# Patient Record
Sex: Female | Born: 1970 | Race: White | Hispanic: No | Marital: Married
Health system: Northeastern US, Academic
[De-identification: ages and names within clinical notes are randomized; demographics above are authoritative.]

## PROBLEM LIST (undated history)

## (undated) DIAGNOSIS — K589 Irritable bowel syndrome without diarrhea: Secondary | ICD-10-CM

## (undated) DIAGNOSIS — Z8679 Personal history of other diseases of the circulatory system: Secondary | ICD-10-CM

## (undated) DIAGNOSIS — E669 Obesity, unspecified: Secondary | ICD-10-CM

## (undated) DIAGNOSIS — D649 Anemia, unspecified: Secondary | ICD-10-CM

## (undated) DIAGNOSIS — R519 Headache, unspecified: Secondary | ICD-10-CM

## (undated) DIAGNOSIS — R51 Headache: Secondary | ICD-10-CM

## (undated) DIAGNOSIS — J339 Nasal polyp, unspecified: Secondary | ICD-10-CM

## (undated) HISTORY — DX: Headache: R51

## (undated) HISTORY — PX: NO SIGNIFICANT SURGICAL HISTORY: 1000005

## (undated) HISTORY — DX: Irritable bowel syndrome, unspecified: K58.9

## (undated) HISTORY — DX: Obesity, unspecified: E66.9

## (undated) HISTORY — DX: Nasal polyp, unspecified: J33.9

## (undated) HISTORY — PX: CERVICAL FUSION: SHX112

## (undated) HISTORY — DX: Anemia, unspecified: D64.9

## (undated) HISTORY — DX: Headache, unspecified: R51.9

---
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
## 2004-04-13 ENCOUNTER — Other Ambulatory Visit: Payer: Self-pay | Admitting: Cardiology

## 2004-04-13 DIAGNOSIS — R109 Unspecified abdominal pain: Secondary | ICD-10-CM

## 2004-04-13 LAB — BLOOD COUNT COMPLETE AUTOMATED
HEMATOCRIT: 39 % (ref 34.9–46.9)
HEMOGLOBIN: 13.2 g/dL (ref 11.7–15.7)
MEAN CORP HGB CONC: 33.8 g/dL (ref 31.4–35.8)
MEAN CORPUSCULAR HGB: 27.9 pg (ref 26.4–34.0)
MEAN CORPUSCULAR VOL: 82.4 fl (ref 80.5–99.7)
MEAN PLATELET VOLUME: 8.7 fL (ref 6.8–12.8)
PLATELET COUNT: 298 10*3/uL (ref 130–400)
RBC DISTRIBUTION WIDTH: 13.1 % (ref 11.0–15.0)
RED BLOOD CELL COUNT: 4.73 M/uL (ref 3.80–5.20)
WHITE BLOOD CELL COUNT: 10.5 10*3/uL (ref 4.5–11.0)

## 2004-04-13 LAB — CHG HEPATIC FUNCTION PANEL
ALANINE AMINOTRANSFERASE: 19 IU/L (ref 7–35)
ALBUMIN: 3.6 g/dl (ref 3.4–4.8)
ALKALINE PHOSPHATASE: 72 IU/L (ref 25–106)
ASPARTATE AMINOTRANSFERASE: 17 IU/L (ref 8–34)
BILIRUBIN DIRECT: 0.1 mg/dl (ref 0.0–0.2)
BILIRUBIN TOTAL: 0.6 mg/dl (ref 0.2–1.1)
TOTAL PROTEIN: 6.3 g/dl (ref 5.9–7.5)

## 2008-01-19 ENCOUNTER — Ambulatory Visit (HOSPITAL_BASED_OUTPATIENT_CLINIC_OR_DEPARTMENT_OTHER): Payer: PRIVATE HEALTH INSURANCE | Admitting: Family Medicine

## 2008-01-19 ENCOUNTER — Encounter (HOSPITAL_BASED_OUTPATIENT_CLINIC_OR_DEPARTMENT_OTHER): Payer: Self-pay | Admitting: Family Medicine

## 2008-01-19 VITALS — BP 124/88 | HR 97 | Temp 99.2°F | Ht 61.0 in | Wt 197.8 lb

## 2008-01-19 DIAGNOSIS — Z01419 Encounter for gynecological examination (general) (routine) without abnormal findings: Secondary | ICD-10-CM

## 2008-01-19 DIAGNOSIS — Z6837 Body mass index (BMI) 37.0-37.9, adult: Secondary | ICD-10-CM | POA: Insufficient documentation

## 2008-01-19 DIAGNOSIS — Z23 Encounter for immunization: Secondary | ICD-10-CM

## 2008-01-19 DIAGNOSIS — E669 Obesity, unspecified: Secondary | ICD-10-CM

## 2008-01-19 DIAGNOSIS — G43909 Migraine, unspecified, not intractable, without status migrainosus: Secondary | ICD-10-CM

## 2008-01-19 DIAGNOSIS — R12 Heartburn: Secondary | ICD-10-CM

## 2008-01-19 DIAGNOSIS — Z833 Family history of diabetes mellitus: Secondary | ICD-10-CM

## 2008-01-19 DIAGNOSIS — K589 Irritable bowel syndrome without diarrhea: Secondary | ICD-10-CM | POA: Insufficient documentation

## 2008-01-19 DIAGNOSIS — IMO0001 Reserved for inherently not codable concepts without codable children: Secondary | ICD-10-CM

## 2008-01-19 DIAGNOSIS — Z832 Family history of diseases of the blood and blood-forming organs and certain disorders involving the immune mechanism: Secondary | ICD-10-CM

## 2008-01-19 LAB — BLOOD COUNT COMPLETE AUTOMATED
HEMATOCRIT: 39.1 % (ref 36.0–48.0)
HEMOGLOBIN: 13.1 g/dl (ref 12.0–16.0)
MEAN CORP HGB CONC: 33.6 g/dl (ref 32.0–36.0)
MEAN CORPUSCULAR HGB: 28.3 pg (ref 27.0–33.0)
MEAN CORPUSCULAR VOL: 84.3 fl (ref 80.0–100.0)
MEAN PLATELET VOLUME: 9.1 fl (ref 6.4–10.8)
PLATELET COUNT: 293 10*3/uL (ref 150–400)
RBC DISTRIBUTION WIDTH: 12.7 % (ref 11.5–14.3)
RED BLOOD CELL COUNT: 4.63 M/uL (ref 4.50–5.10)
WHITE BLOOD CELL COUNT: 11.1 10*3/uL — ABNORMAL HIGH (ref 4.0–10.8)

## 2008-01-19 MED ORDER — BUTALBITAL-APAP-CAFFEINE 50-325-40 MG PO TABS
ORAL_TABLET | ORAL | Status: DC
Start: 2008-01-19 — End: 2008-05-24

## 2008-01-19 MED ORDER — BUTALBITAL-APAP-CAFFEINE 50-325-40 MG PO TABS
ORAL_TABLET | ORAL | Status: DC
Start: 2008-01-19 — End: 2008-01-19

## 2008-01-19 NOTE — Progress Notes (Signed)
PRECEPTOR NOTE:  On the day of the patient's visit, I discussed the key elements of history and physical exam and I reviewed the findings with the resident.  I agree with the assessment and plan as described in their documentation.  Please see resident's note for further details.

## 2008-01-19 NOTE — Progress Notes (Signed)
VIS given prior to administration and reviewed with the patient and or legal guardian. Patient understands the disease and the vaccine. See immunization/Injection module or chart review for date of publication and additional information.

## 2008-01-19 NOTE — Progress Notes (Signed)
SUBJECTIVE:   37 year old female to establish PCP.    Patient Active Problem List:     Migraine [346.90A]     Heartburn [787.1]     Irritable Bowel Syndrome [564.1]     Obesity (BMI 30.0-39.9) [278.00AE]    Migraine - approx 1 x/month, no vision changes, controlled by Fioricet    Heartburn stable on zantac    IBS - flares every few weeks, controlled by metamucil    Obesity - since after 1st child has tried and failed Weight Watchers, Atkins, Nutrisystem.  Eats high-fiber low fat diet.  Not much meat.  Lots of simple carbs.  No soda.  No formal exercise      Past Medical History    Obesity     Anemia          Past Surgical History    NO SIGNIFICANT SURGICAL HISTORY            Current outpatient prescriptions:  ZANTAC 150 MG OR CAPS 1 CAPSULE AT BEDTIME Disp: 30 Rfl: 11   LOESTRIN FE 1.5/30 1.5-30 MG-MCG OR TABS 1 TABLET DAILY Disp: 28 Rfl: 6   BUTALBITAL-APAP-CAFFEINE 50-325-40 MG OR TABS 1 OR 2 TABLETS EVERY 4 HOURS AS NEEDED Disp: 12 Rfl: 0       Allergies: Review of patient's allergies indicates no known allergies.       Tobacco Use: Never           Alcohol Use: No            Social History Narrative    Lives with husband and 4 children.  Does not work outside of home.  Used to Medical laboratory scientific officer.  Likes to spend time with family and friends, go to [employer], local youth organizations.  No formal exercise.        Family History    Heart Father    Heart Brother    Stroke Father    Diabetes Maternal Grandmother    Cancer - Lung Mother    Cancer - Breast Maternal Grandmother    Comment: post menopausal    Cancer - Prostate Maternal Grandmother    Comment: post menopausal    Hypertension Mother    Hypertension Father    Lipids Father           ROS:  Feeling well. No dyspnea or chest pain on exertion.  No abdominal pain, change in bowel habits, black or bloody stools.  No urinary tract symptoms. GYN ROS: normal menses, no abnormal bleeding, pelvic pain or discharge and no breast pain or new or enlarging lumps on self  exam.    OBJECTIVE:   The patient appears well, in NAD.   BP 124/88   Pulse 97   Temp 99.2 F (37.3 C)   Ht 5\' 1"  (1.549 m)   Wt 197 lb 12.8 oz (89.721 kg)   SpO2 98%  ENT normal.  Neck supple. No adenopathy or thyromegaly. PERLA. Clear, good air entry, no wheezes, rhonci or rales. S1 and S2 normal, no murmurs, regular rate and rhythm. No edema. Abdomen soft without tenderness, guarding, mass or organomegaly. Reflexes 2+ at patellar B/L  SKIN: R lateral upper arm with 5mm circumfrential nodule verruca like appearance. Pedunculated brown skin tag between breasts    BREAST EXAM: done 2/09 at GYN office  PELVIC EXAM: done 2/09 at GYN office    ASSESSMENT/PLAN: 37 y/o female with  787.1 Heartburn  Comment: stable, will monitor  Plan: ZANTAC 150  MG OR CAPS    346.90A Migraine  Comment: will refill Fiorict disp #12, counseled to use back-up method of contraception if using fioricet  Plan: BUTALBITAL-APAP-CAFFEINE 50-325-40 MG OR TABS,       V25.9N Contraception  Comment: prescribed by OB/GYN  Plan: LOESTRIN FE 1.5/30 1.5-30 MG-MCG OR TABS    V18.2 Family History of Anemia  Comment: anemia in childhood and pregnancies  Plan: COMPLETE CBC, AUTOMATED    V18.0 Family History of Diabetes Mellitus  Comment: MGM, obese pt  Plan: random glucose today    278.00AE Obesity (BMI 30.0-39.9)  Comment: nutrition and exercise counseling provided, written info given.  Pt declined referral to nutritionist  Plan: ASSAY, BLD/SERUM CHOLESTEROL, ASSAY OF         LIPOPROTEIN (HDL), ASSAY OF BLOOD LIPOPROTEIN         (LDL), THYROID SCREEN TSH    V05.9A Need for Vaccination  Plan: TDAP VACCINE 7 YR + IM    Skin Tags  1 skin tag between breasts referred to procedure clinic for removal  Skin nodule  L lateral upper arm -c/w wart, referred to procedure clinic for removal     F/U 1 year, sooner PRN  Silvestre Mesi, M.D.

## 2008-01-20 LAB — GLUCOSE RANDOM: Glucose Random: 80 mg/dl (ref 74–160)

## 2008-01-20 LAB — CHG LIPOPROTEIN DIRECT MEASUREMENT LDL CHOLESTEROL: LOW DENSITY LIPOPROTEIN DIRECT: 150 mg/dl — ABNORMAL HIGH (ref 0–100)

## 2008-01-20 LAB — CHOLESTEROL SERUM/WHOLE BLOOD TOTAL: Cholesterol: 239 mg/dl — ABNORMAL HIGH (ref 0–200)

## 2008-01-20 LAB — CHG LIPOPROTEIN DIR MEAS HIGH DENSITY CHOLESTEROL: HIGH DENSITY LIPOPROTEIN: 67 mg/dl (ref 35–85)

## 2008-01-20 LAB — THYROID SCREEN TSH REFLEX FT4: THYROID SCREEN TSH REFLEX FT4: 1.03 u[IU]/mL (ref 0.34–5.60)

## 2008-01-20 NOTE — Progress Notes (Addendum)
Addended by: Mervin Hack C on: 01/20/2008 9:05:03 AM     Modules accepted: Orders

## 2008-01-20 NOTE — Progress Notes (Addendum)
Addended by: Earlyne Iba on: 01/20/2008 5:18:20 PM     Modules accepted: Orders

## 2008-02-19 ENCOUNTER — Ambulatory Visit (HOSPITAL_BASED_OUTPATIENT_CLINIC_OR_DEPARTMENT_OTHER): Payer: PRIVATE HEALTH INSURANCE | Admitting: Family Medicine

## 2008-02-19 VITALS — BP 122/80 | HR 78 | Temp 98.1°F | Resp 16 | Wt 199.0 lb

## 2008-02-19 DIAGNOSIS — L919 Hypertrophic disorder of the skin, unspecified: Secondary | ICD-10-CM

## 2008-02-19 DIAGNOSIS — L909 Atrophic disorder of skin, unspecified: Secondary | ICD-10-CM

## 2008-02-19 DIAGNOSIS — I781 Nevus, non-neoplastic: Secondary | ICD-10-CM

## 2008-02-19 DIAGNOSIS — D235 Other benign neoplasm of skin of trunk: Secondary | ICD-10-CM

## 2008-02-19 NOTE — Patient Instructions (Addendum)
DERMATOFIBROMA    Call if bleeding, infection, pus, fevers, increased pain.

## 2008-02-19 NOTE — Progress Notes (Signed)
PATIENT/PROCEDURE VERIFICATION DOCUMENTATION    Correct patient: Yes  Correct procedure: Yes  Correct side, site, mark visible if applicable: Yes  Correct position: Yes  Special equipment/implant(s) present, if applicable: Yes    Time-out completed, documented by provider doing procedure or designated team member:  Avani Rana    02/19/2008    3:30 PM    SUBJECTIVE:  37 year old female presents for lesion removal. 1. Lesion on right arm for several years started flat, now a little raised, occasionally painful  2. Left underarm skin tag  3. Fleshy lesion between breasts  4. Red lesion under right breast  5. New lesion on left side of nasal bridge    OBJECTIVE: BP 122/80   Pulse 78   Temp (Src) 98.1 F (36.7 C) (Oral)   Resp 16   Wt 199 lb (90.266 kg)   SpO2 100%  Pain Score: 0 (0/10)  Gen: overweight female, a little anxious, NAD  Skin: 1. right lateral arm 1.78mm well circumscribed slightly red in color, papule. NT  2. Acrochordon in left axilla  3. Fleshy, pigmented nodule 3mm lower sternum between breasts  4. Red, vascular 2.64mm oblong nodule under right breast  5. 1mm soft papule on left bridge of nose. Uniformly pigmented, well circumscribed.    ASSESSMENT:  1. Dermatofibroma - no need for bx at this time. Pt given information to read.  2. Acrochordon - painful location  3.  Pigmented intradermal nevus  4. Cherry hemangioma  5. Simple nevus Left Nasal Bridge - no need for intervention at this time, continue to monitor.    PLAN:  After informed consent was obtained, using Betadine for cleansing   and 1% Lidocaine with epinephrine and bicarbonate for anesthetic, with sterile   technique a shave excision of the lesion was performed  flush with   the surrounding skin.  Hemostasis was obtained by drysol and pressure.    Antibiotic dressing is applied, and wound care instructions   provided.  Be alert for any signs of cutaneous infection.  Two   specimens are labelled and sent to pathology for evaluation.  The    procedure was well tolerated without complications.

## 2008-02-21 NOTE — Progress Notes (Addendum)
I precepted and supervised the shave excisions of three separate benign lesions in this 37 yo woman.  These included a skin tag in the L axilla region, a pigmented raised intradermal nevus on lower sternum, and a large cherry angioma in medial flexure crease below the R breast.  I have reviewed the procedure note by Dr. Marney Doctor and agree with her descriptions and postop care plans as described.

## 2008-02-24 ENCOUNTER — Telehealth (HOSPITAL_BASED_OUTPATIENT_CLINIC_OR_DEPARTMENT_OTHER): Payer: Self-pay | Admitting: Family Medicine

## 2008-02-24 LAB — SURGICAL PATH SPECIMEN

## 2008-02-24 NOTE — Telephone Encounter (Signed)
Called and spoke to husband who was inquiring about lesions removed last week.  Reasured that they were benign.  One was compound nevus and other capillary angioma.  All questions answered.

## 2008-03-10 ENCOUNTER — Encounter (HOSPITAL_BASED_OUTPATIENT_CLINIC_OR_DEPARTMENT_OTHER): Payer: Self-pay | Admitting: Family Medicine

## 2008-05-24 ENCOUNTER — Other Ambulatory Visit (HOSPITAL_BASED_OUTPATIENT_CLINIC_OR_DEPARTMENT_OTHER): Payer: Self-pay | Admitting: Ambulatory Care

## 2008-05-24 DIAGNOSIS — G43909 Migraine, unspecified, not intractable, without status migrainosus: Secondary | ICD-10-CM

## 2008-05-24 MED ORDER — BUTALBITAL-APAP-CAFFEINE 50-325-40 MG PO TABS
ORAL_TABLET | ORAL | Status: DC
Start: 2008-05-24 — End: 2009-05-31

## 2008-05-24 NOTE — Telephone Encounter (Signed)
Staff Message copied by Vinetta Bergamo on Tue May 24, 2008 11:48 AM  ------   Message from: Fermin Schwab   Created: Tue May 24, 2008 11:41 AM   Regarding: rx refill     Priscilla Williams [id number], 37 year old, female, Telephone Information:  Home Phone [phone]  Work Phone [phone]      Cleotis Lema NUMBER: [phone]  Best time to call back: -  Cell phone:   Other phone:    Available times:    Patient's language of care: English    Patient does not need an interpreter.    Patient's PCP: Helaine Chess    Person calling on behalf of patient: Patient (self)    Calls today for med refill(s). Patient says pharmacy already called requesting refill on this. Patient would also like to know if there's possible to get more pills at one time because [place] and she always take 2 pills at a time. Please advise.    Patient's Preferred Pharmacy:   [facility] [address] [place]  Phone: [phone] Fax: [phone]

## 2008-05-24 NOTE — Telephone Encounter (Signed)
Called patient back. She said she needs a refill on her firocet. She takes it for migraines. She wants to know if she can get a supply of 30. She said when she gets the migraine she usually takes 2 every 4 hours. She said if she has a 30 day supply it will really last her a long time and she won't have to call. She said the 12 pills she got in July lasted her until just about 3 weeks ago. She is going away on Friday and needs them before she leaves.  Advised we would call her if there was a problem.

## 2009-05-31 ENCOUNTER — Other Ambulatory Visit (HOSPITAL_BASED_OUTPATIENT_CLINIC_OR_DEPARTMENT_OTHER): Payer: Self-pay | Admitting: Family Medicine

## 2009-05-31 DIAGNOSIS — G43909 Migraine, unspecified, not intractable, without status migrainosus: Secondary | ICD-10-CM

## 2009-05-31 MED ORDER — BUTALBITAL-APAP-CAFFEINE 50-325-40 MG PO TABS
ORAL_TABLET | ORAL | Status: DC
Start: 2009-05-31 — End: 2010-02-20

## 2009-05-31 NOTE — Telephone Encounter (Signed)
[facility]    Person calling on behalf of patient: Patient (self)    May list multiple medications in this section  Medicine Name: Butalbital-acetaminophern-caffeine  Dosage: 50-325-40 mg  Frequency (how many pills, how many times a day): 1 or 2 tablets every 4 hours as needed  Number of pills left: none  Documented patient preferred pharmacies:  [facility] [address] [place] [phone] Fax: [phone]    Has an appt to see pcp 06/30/09 for cpex

## 2009-06-30 ENCOUNTER — Encounter (HOSPITAL_BASED_OUTPATIENT_CLINIC_OR_DEPARTMENT_OTHER): Payer: Self-pay | Admitting: Family Medicine

## 2009-06-30 ENCOUNTER — Ambulatory Visit (HOSPITAL_BASED_OUTPATIENT_CLINIC_OR_DEPARTMENT_OTHER): Payer: PRIVATE HEALTH INSURANCE | Admitting: Family Medicine

## 2009-06-30 VITALS — BP 114/80 | HR 84 | Temp 98.8°F | Ht 61.0 in

## 2009-06-30 DIAGNOSIS — K589 Irritable bowel syndrome without diarrhea: Secondary | ICD-10-CM

## 2009-06-30 DIAGNOSIS — G43909 Migraine, unspecified, not intractable, without status migrainosus: Secondary | ICD-10-CM

## 2009-06-30 DIAGNOSIS — Z0000 Encounter for general adult medical examination without abnormal findings: Secondary | ICD-10-CM

## 2009-06-30 DIAGNOSIS — R12 Heartburn: Secondary | ICD-10-CM

## 2009-06-30 DIAGNOSIS — F439 Reaction to severe stress, unspecified: Secondary | ICD-10-CM

## 2009-06-30 DIAGNOSIS — E669 Obesity, unspecified: Secondary | ICD-10-CM

## 2009-06-30 NOTE — Progress Notes (Signed)
PRECEPTOR NOTE:  On the day of the patient's visit, I discussed the key elements of history and physical exam and I reviewed the findings with the resident.  I agree with the assessment and plan as described in their documentation.  Please see resident's note for further details.

## 2009-06-30 NOTE — Progress Notes (Signed)
Priscilla Williams is a 39 year old female here for a physical.    Patient Active Problem List:     Migraine [346.90A]     Heartburn [787.1]     Irritable Bowel Syndrome [564.1]     Obesity (BMI 30.0-39.9) [278.00AE]    I have reviewed the past medical, surgical, social and family history and updated these sections of EpicCare as relevant. All interim labs, test results, and consult notes were reviewed and discussed with Priscilla Williams. Medications were reconciled during this visit and a current medication list was given to the patient at the end of the visit.    Lot of stress lately, husband with recent health issue, neglecting self to get everything done.    PHQ-9 score of 4, causing somewhat difficulty with life.  Hard staying asleep, getting up most nights to urinate  Feels she is easily distractible - but does not cause problems at work  Discussed coping mechanisms including taking small amounts of personal time daily to decompress, talking to support people vs having therapy visits to discuss.     Also has toothache - saw dentist - crown to cost $1500      Migraines - Takes fioricet couple times per month, has tried fiorinal in past without relief, prefers not to take daily meds.  Has identified stress as a trigger.  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  Pt agrees to stop today, will be abstinent or use condoms until discuss progesterone-only options with her ObGyn.     IBS - Uses metamucil daily, makes more regular.  Still occ gets diarrhea. Has identified stressa nd particular dietary triggers.     Heartburn - Takes Zantac occ, sometimes rolaid instead, does not like daily medication    ROS: Occ SOB with stairs, no CP or leg edema    HM: Pt has had pap last year, not due until 2012, will have pelvic exam and breast exam at [facility] visit.  Pt declines a flu shot.      PE: BP 114/80   Pulse 84    Temp(Src) 98.8 F (37.1 C) (Temporal)   Ht 5\' 1"  (1.549 m)   SpO2 98%  Gen - Pleasant woman sitting upright in chair in NAD  HEENT - NC/AT, PERRLA, EOMI, TM clear bilat, Nares patent bilat, MMM, poor dentition - esp molars, Post pharnyx without erythema or exudate  Neck - FROM, no LAD, no thyromegaly  CV - RRR, no m/r/g  Resp - CTAB  Abd - Overweight, +BS, soft, NT/ND  Ext - No edema  Skin - No suspicious lesions noted  Psych - Appropriate, good sense of humor  Neuro - CNII-XII intact, strength 5/5 UE and LE bilat, sensation to lt touch grossly intact, normal gait  Genital - Defer  Breast - Defer    A/P:    V70.0 Routine general medical examination at a health care facility  (primary encounter diagnosis)  Comment: Relatively healthy woman. HM UTD, Pelvic and breast exam to be completed at [facility] visit. Refuses flu shot despite counselling    346.90A Migraine  Comment: Somewhat frequent (around 2/month), controlled with fioricet.  Pt prefers not to take daily controller medication, bt may benefit from change to triptan for migraine abortive medication.   Consider at future visits. Acute stress likely playing a role.  Given previous aura, pt strongly advised to stop estrogen-containing BC, pt agrees, will f/u with [facility]  for alternative.  Progesterone only options briefly discussed with pt.  - Pt will log migraine sx and triggers to bring to next visit  - F/u in 2 months    787.1 Heartburn  Comment: Well controlled with occ Zantac vs rolaids.  Pt refuses to take daily medication  Plan: ranitidine (ZANTAC) 150 MG TABS    564.1 Irritable bowel syndrome  Comment: Under relative control with metamucil and avoidance of triggers.  Acute stress likely playing a role.    278.00J Obesity  Comment: Pt would like to lose weight, but has found it hard to find time to exercise outside of work.  Pt encouraged to take some time for herself to be there for her family in the long run.  Acute stress likely playing a role.    V62.89E  Situational stress  Comment: Increased aily life stress due to difficulty at work, raising 4 children and taking care fo acutely ill husband. Pt neglects self to take care of others.  With motivational interviewing, was able to identify brief periods in day she might take time for herself to de-stress.  Will try this and identify new goal at next visit if successful. Pt understands stress relates to many of her above problems.  No acute depression, denies SI,HI.  Encouraged to call and be seen earlier if needed.    F/u in 2 months for migraines and stress

## 2009-08-18 ENCOUNTER — Telehealth (HOSPITAL_BASED_OUTPATIENT_CLINIC_OR_DEPARTMENT_OTHER): Payer: Self-pay | Admitting: Family Medicine

## 2009-08-18 ENCOUNTER — Ambulatory Visit (HOSPITAL_BASED_OUTPATIENT_CLINIC_OR_DEPARTMENT_OTHER): Payer: PRIVATE HEALTH INSURANCE | Admitting: Family Medicine

## 2009-08-18 DIAGNOSIS — IMO0001 Reserved for inherently not codable concepts without codable children: Secondary | ICD-10-CM

## 2009-08-18 MED ORDER — NORETHINDRONE 0.35 MG PO TABS
1.0000 | ORAL_TABLET | ORAL | Status: DC
Start: 2009-08-18 — End: 2009-11-13

## 2009-08-18 NOTE — Telephone Encounter (Signed)
Message copied by Earlyne Iba on Fri Aug 18, 2009  2:37 PM  ------       Message from: RIVERA, [place]       Created: Fri Aug 18, 2009  2:31 PM       Regarding: rx         Bhakti Labella [id number], 39 year old, female, Telephone Information:       Home Phone      [phone]       Work Phone      [phone]       Mobile          Not on file.                     CALL BACK NUMBER: [phone]       Cell phone:        Other phone:              Available times:              Patient's language of care: English              Patient does not need an interpreter.              Patient's Care Team:               Person calling on behalf of patient: Patient (self)              Calls today pt was told she should take a birthcontrol with out estrogen at her last appt. She thought she would go thru her ob/gyn but her appt is not anytime soon. Pt would like a birthcontrol called in with out estrogen.               Patient's Preferred Pharmacy: [facility] [address] [place]              Phone: [phone] Fax: [phone]

## 2009-08-18 NOTE — Progress Notes (Signed)
This encounter was opened in error.  Please disregard.

## 2009-08-18 NOTE — Telephone Encounter (Signed)
Patient would like to dicuss a birth control with her PCP

## 2009-08-18 NOTE — Telephone Encounter (Signed)
Called patient, who was not aware she had missed an appt this AM.  Patient will call to reschedule migraine follow-up.    Patient advised to stop combined contraceptive pills, which she has.  She preferred to discuss progesterone only method with her Ob/Gyn provider, but could not get a visit for months.  She will continue abstinence until she is able to start the new progesterone only pills.      Prescription for micronor sent to pharmacy

## 2009-11-13 ENCOUNTER — Other Ambulatory Visit (HOSPITAL_BASED_OUTPATIENT_CLINIC_OR_DEPARTMENT_OTHER): Payer: Self-pay | Admitting: Family Medicine

## 2009-11-13 DIAGNOSIS — IMO0001 Reserved for inherently not codable concepts without codable children: Secondary | ICD-10-CM

## 2009-11-13 MED ORDER — NORETHINDRONE 0.35 MG PO TABS
1.0000 | ORAL_TABLET | ORAL | Status: AC
Start: 2009-11-13 — End: 2010-02-10

## 2009-11-13 NOTE — Telephone Encounter (Signed)
[facility]    Person calling on behalf of patient: Patient (self)    May list multiple medications in this section  Medicine Name: Norethindrome  Dosage: 0.35 mg  Frequency (how many pills, how many times a day): one tab daily  Number of pills left: none  Documented patient preferred pharmacies:  [facility] [address] [place]: [phone] Fax: [phone]      CALL BACK NUMBER:   Cell phone:   Other phone:    Available times:    Patient's language of care: English    Patient does not need an interpreter.

## 2009-12-28 ENCOUNTER — Other Ambulatory Visit (HOSPITAL_BASED_OUTPATIENT_CLINIC_OR_DEPARTMENT_OTHER): Payer: Self-pay | Admitting: Family Medicine

## 2009-12-29 NOTE — Telephone Encounter (Signed)
Priscilla Williams is a 39 year old female has requested a refill of Jolivette 0.35mg   Other Med Adult:  Most Recent BP Reading(s)  06/30/09 : 114/80        Cholesterol (mg/dl)   Date     Date  Value    01/19/2008  239*   ----------    LDL (mg/dl)   Date     Date  Value    01/19/2008  150*   ----------    HDL (mg/dl)   Date     Date  Value    01/19/2008  67    ----------    No results found for this basename: tg:1        THYROID SCREEN TSH (uIU/mL)   Date     Date  Value    01/19/2008  1.03    ----------        No results found for this basename: TSH:1      No results found for this basename: hgba1c:1        No results found for this basename: INR:3       Documented patient preferred pharmacies:  [facility] [address] [place]: [phone] Fax: [phone]

## 2010-02-09 ENCOUNTER — Ambulatory Visit (HOSPITAL_BASED_OUTPATIENT_CLINIC_OR_DEPARTMENT_OTHER): Payer: PRIVATE HEALTH INSURANCE | Admitting: Family Medicine

## 2010-02-09 ENCOUNTER — Telehealth (HOSPITAL_BASED_OUTPATIENT_CLINIC_OR_DEPARTMENT_OTHER): Payer: Self-pay | Admitting: Ambulatory Care

## 2010-02-09 ENCOUNTER — Encounter (HOSPITAL_BASED_OUTPATIENT_CLINIC_OR_DEPARTMENT_OTHER): Payer: Self-pay | Admitting: Family Medicine

## 2010-02-09 VITALS — BP 120/88 | HR 70 | Temp 98.2°F | Resp 18 | Wt 193.0 lb

## 2010-02-09 DIAGNOSIS — M7060 Trochanteric bursitis, unspecified hip: Secondary | ICD-10-CM

## 2010-02-09 MED ORDER — NAPROXEN 500 MG PO TABS
500.0000 mg | ORAL_TABLET | Freq: Two times a day (BID) | ORAL | Status: DC
Start: 2010-02-09 — End: 2014-04-11

## 2010-02-09 NOTE — Progress Notes (Signed)
Cc: right hip pain    HPI: 39 yo F with h/o IBS presents with 2 weeks of right hip pain.    1. hip pain.  2 weeks ago  Walked a lot to work  Right hip started to hurt  slight pain at the beginning, thought twisted it  Yesterday around 3pm, getting out of car. Halfway across the street felt she could not move her right leg  10/10 yesterday  Constant all night  Now able to walk and function again    This am. Got out of bed and felt like she couldn't move her right leg. Couldn't lift up her hip. But then got better.    Constant ache now  If moves it gets a sharp pain.   Now standing or walking it is worse  In right hip, when bad radiates to knee  Better: advil and rest  Worse: standing or moving  No numbness or tingling  No back problems.     Did have some popping of right hip in the past.    PMH, med and allergies reviewed and updated in epic.    PE:  BP 120/88   Pulse 70   Temp(Src) 98.2 F (36.8 C) (Temporal)   Resp 18   Wt 193 lb (87.544 kg)   SpO2 100%  Gen: NAD   CV:  RRR, no m/r/g  Pulm: CTAB no w/r/r  LE: right hip: no gross deformity of hip.  nontender over asis, nontender over paraspinal muscles or gluteus muscles. +tender of trochanteric bursa. Able to extend hip fully will some discomfort. Pain with external rotation. Internal rotation is normal. When extending leg back down to examining table there was an audible popping sound. Left side exam is normal. Knee exams are wnl as well.    A/p: 39 yo F with trochanteric bursitis.    726.5S Trochanteric bursitis  (primary encounter diagnosis)  Comment: pt has pain over trochanter and has morning stiffness as well. She has some popping which can occur as iliotibial band moves over the trochanter.  1. NSAIDs naproxen (NAPROSYN) 500 MG tablet  2. Ice  3. Rest. 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 Not was given for modified work. Only to work 1/2 days.  4. F/u in 2 weeks. If still having pain will consider trochanter bursa  injection.    Otis Dials, MD  Discussed with Dr. Hyacinth Meeker.

## 2010-02-09 NOTE — Telephone Encounter (Signed)
Message copied by Vinetta Bergamo on Fri Feb 09, 2010  9:37 AM  ------       Message from: Marykay Lex       Created: Fri Feb 09, 2010  9:19 AM       Regarding: needs call back       Contact: [phone]         [facility]              Kaarin Pardy [id number], 39 year old, female, Telephone Information:       Home Phone      [phone]       Work Phone      [phone]       Mobile          Not on file.                     Cleotis Lema NUMBER: [phone]       Best time to call back: asap       Cell phone:        Other phone:              Available times:              Patient's language of care: English              Patient does not need an interpreter.              Patient's PCP: Earl Lites. Darrol Angel, MD              Person calling on behalf of patient: Patient (self)              Calls today with a sick call. Pt hurt her leg and is having trouble walking I booked for tomorrow but she wants to talk to a nurse              Patient's Preferred Pharmacy:        [facility] [address] [place]              Phone: [phone] Fax: [phone]

## 2010-02-09 NOTE — Telephone Encounter (Signed)
Called and spoke to patient. She states last week her hip started to hurt her. She does a lot of walking and thought she just over did it. But yesterday she could barely walk, stand or rotate her leg. It then eased p and she was able to walk, but with a pain and ache. She laid on her back and she can't even lift her leg.  appt made today for evaluation.

## 2010-02-10 ENCOUNTER — Ambulatory Visit (HOSPITAL_BASED_OUTPATIENT_CLINIC_OR_DEPARTMENT_OTHER): Payer: PRIVATE HEALTH INSURANCE | Admitting: Family Medicine

## 2010-02-13 NOTE — Progress Notes (Addendum)
Discussed case with the resident, reviewed the patient's problem list and medication list. Agree with assessment and plan as presented by the resident. Katherine Miller MD

## 2010-02-20 ENCOUNTER — Other Ambulatory Visit (HOSPITAL_BASED_OUTPATIENT_CLINIC_OR_DEPARTMENT_OTHER): Payer: Self-pay | Admitting: Family Medicine

## 2010-02-20 NOTE — Telephone Encounter (Signed)
Priscilla Williams is a 39 year old female has requested a refill of butalbital-acetaminophen-caffeine (fioricet) prescribed on 05/31/2009  Other Med Adult:  Most Recent BP Reading(s)  02/09/10 : 120/88        Cholesterol (mg/dl)   Date     Date  Value    01/19/2008  239*   ----------    LDL (mg/dl)   Date     Date  Value    01/19/2008  150*   ----------    HDL (mg/dl)   Date     Date  Value    01/19/2008  67    ----------    No results found for this basename: tg:1        THYROID SCREEN TSH (uIU/mL)   Date     Date  Value    01/19/2008  1.03    ----------        No results found for this basename: TSH:1      No results found for this basename: hgba1c:1        No results found for this basename: INR:3       Documented patient preferred pharmacies:  [facility] [address] [place]: [phone] Fax: [phone]

## 2010-02-28 ENCOUNTER — Other Ambulatory Visit (HOSPITAL_BASED_OUTPATIENT_CLINIC_OR_DEPARTMENT_OTHER): Payer: Self-pay | Admitting: Family Medicine

## 2010-02-28 DIAGNOSIS — R12 Heartburn: Secondary | ICD-10-CM

## 2010-02-28 MED ORDER — RANITIDINE HCL 150 MG PO TABS
150.0000 mg | ORAL_TABLET | Freq: Every day | ORAL | Status: DC
Start: 2010-02-28 — End: 2012-04-21

## 2010-02-28 NOTE — Telephone Encounter (Signed)
Refilled Rx. 

## 2010-03-05 ENCOUNTER — Encounter (HOSPITAL_BASED_OUTPATIENT_CLINIC_OR_DEPARTMENT_OTHER): Payer: Self-pay | Admitting: Family Medicine

## 2010-03-05 ENCOUNTER — Ambulatory Visit (HOSPITAL_BASED_OUTPATIENT_CLINIC_OR_DEPARTMENT_OTHER): Payer: PRIVATE HEALTH INSURANCE | Admitting: Family Medicine

## 2010-03-05 VITALS — BP 130/80 | HR 99 | Temp 97.2°F | Resp 18 | Wt 197.0 lb

## 2010-03-05 DIAGNOSIS — M7060 Trochanteric bursitis, unspecified hip: Secondary | ICD-10-CM

## 2010-03-05 NOTE — Progress Notes (Signed)
F/u hip pain    HPI: 39 yo F with h/o obesity presents for 1 month f/u for trochanteric bursitis.    1. Right hip pain  Trying to stay off of it  Better  Still aches here and there  Depends on what she does  Before it was a 10.  Now notices it when walking-aching.  Worse climbing stairs  3-4/10  Walking 1/2 days at work.   Not taking the medications everyday, doesn't need to.  Thinks she is much better  She does not think she needs and injection    Declined flu shot today.     PMH, med and allergies reviewed and updated in epic.    SH: husband recovering from a cabg and has 4 kids at home. Very busy, lots of stress but managing ok.     PE:  BP 130/80   Pulse 99   Temp(Src) 97.2 F (36.2 C) (Temporal)   Resp 18   Wt 197 lb (89.359 kg)   SpO2 98%  Gen: NAD   CV:  RRR, no m/r/g  Pulm: CTAB no w/r/r  LE: full flexion at hip b/l. non tender over trochanteric bursa and iliotibial band on right. Full flexion/extension at the knees.    A/p: 39 yo F here for hip pain f/u, pain is much better.    726.5S Trochanteric bursitis  (primary encounter diagnosis)  Comment: on right. Much improved with rest and nsaids.  1. Discussed with pt to continue to take it easy  2. May start stretches now and yoga as needed.  3. If flares again try rest, ice, nsaids, if not improved enough return to office for injection.    Otis Dials, MD  Discussed with Dr. Darlyne Russian.

## 2010-03-05 NOTE — Progress Notes (Signed)
Much improved with rest and NSAIDS.

## 2010-03-21 ENCOUNTER — Telehealth (HOSPITAL_BASED_OUTPATIENT_CLINIC_OR_DEPARTMENT_OTHER): Payer: Self-pay | Admitting: Family Medicine

## 2010-03-21 NOTE — Telephone Encounter (Signed)
Message copied by Raynelle Dick on Wed Mar 21, 2010  3:48 PM  ------       Message from: Sherrlyn Hock       Created: Wed Mar 21, 2010  1:54 PM       Regarding: nasal polps         [facility]              Priscilla Williams [id number], 39 year old, female, Telephone Information:       Home Phone      [phone]       Work Phone      [phone]       Mobile          Not on file.                     CALL BACK [phone]       Best time to call back:  Anytime after 3 pm       Cell phone:        Other phone:              Available times:              Patient's language of care: English              Patient does not need an interpreter.              Patient's PCP: Earl Lites. Darrol Angel, MD              Person calling on behalf of patient: Patient (self)              Calls today  Has nasal polps does she need to come in or can dr just give her a script               Patient's Preferred Pharmacy:        [facility] [address] [place]              Phone: [phone] Fax: [phone]

## 2010-03-21 NOTE — Telephone Encounter (Signed)
lmtcb

## 2010-03-22 ENCOUNTER — Encounter (HOSPITAL_BASED_OUTPATIENT_CLINIC_OR_DEPARTMENT_OTHER): Payer: Self-pay | Admitting: Family Medicine

## 2010-03-22 DIAGNOSIS — J339 Nasal polyp, unspecified: Secondary | ICD-10-CM | POA: Insufficient documentation

## 2010-03-22 HISTORY — DX: Nasal polyp, unspecified: J33.9

## 2010-03-22 MED ORDER — MOMETASONE FUROATE 50 MCG/ACT NA SUSP
2.00 | Freq: Two times a day (BID) | NASAL | Status: AC
Start: 2010-03-22 — End: 2010-09-20

## 2010-03-22 NOTE — Telephone Encounter (Signed)
Returned pt call.  She has a hx of nasal polyps and saw Dr Addison Naegeli in 2000-2002 and had scope done.  At time pt was not a part of [place].  She was given Nasonex which helped.  She can now feel polyps again in nose and is wondering if she can get a Rx for Nasonex or something similar to help relieve this problem.

## 2010-03-22 NOTE — Telephone Encounter (Signed)
Pls c/b, re: nasal polyps. Has had in past and medicine has helped in the past  [phone]

## 2010-03-22 NOTE — Telephone Encounter (Signed)
Returning pt call for Dr. Darrol Angel. Left message on voicemail that I will send in nasonex for pt since she had experienced this before per discussion with RN and had scope with ENT. Advised to make appt if symptoms worsen or do not improve

## 2010-03-24 ENCOUNTER — Encounter (HOSPITAL_BASED_OUTPATIENT_CLINIC_OR_DEPARTMENT_OTHER): Payer: Self-pay | Admitting: Family Medicine

## 2010-03-24 DIAGNOSIS — M7060 Trochanteric bursitis, unspecified hip: Secondary | ICD-10-CM | POA: Insufficient documentation

## 2010-03-26 ENCOUNTER — Encounter (HOSPITAL_BASED_OUTPATIENT_CLINIC_OR_DEPARTMENT_OTHER): Payer: Self-pay | Admitting: Family Medicine

## 2010-03-26 ENCOUNTER — Telehealth (HOSPITAL_BASED_OUTPATIENT_CLINIC_OR_DEPARTMENT_OTHER): Payer: Self-pay | Admitting: Registered Nurse

## 2010-03-26 NOTE — Telephone Encounter (Signed)
Message copied by Marlowe Kays on Mon Mar 26, 2010 12:20 PM  ------       Message from: Marykay Lex       Created: Mon Mar 26, 2010 11:35 AM       Regarding: needs call back       Contact: [phone]         [facility]              [place] [id number], 39 year old, female, Telephone Information:       Home Phone      [phone]       Work Phone      [phone]       Mobile          Not on file.                     CALL BACK NUMBER: [phone]       Best time to call back: asap       Cell phone:        Other phone:              Available times:              Patient's language of care: English                            Patient's PCP: Earl Lites. Darrol Angel, MD              Person calling on behalf of patient: [facility] in [place], [address] ( [address] )              Calls today with questions and concerns. Has questions about pt medication, nasanex              Patient's Preferred Pharmacy:        [facility] [address] [place]              Phone: [phone] Fax: [phone]

## 2010-03-26 NOTE — Telephone Encounter (Signed)
nasonex requires p/a

## 2010-04-02 MED ORDER — FLUTICASONE PROPIONATE 50 MCG/ACT NA SUSP
1.00 | Freq: Every day | NASAL | Status: AC
Start: 2010-04-02 — End: 2010-06-02

## 2010-04-02 NOTE — Telephone Encounter (Signed)
I will send in rx for flonase.  I will have nurse inform pt.

## 2010-04-02 NOTE — Telephone Encounter (Signed)
Called and spoke to patient and gave her message below.

## 2010-04-02 NOTE — Telephone Encounter (Signed)
Message copied by Vinetta Bergamo on Mon Apr 02, 2010 10:19 AM  ------       Message from: Barbie Haggis       Created: Mon Apr 02, 2010 10:15 AM       Regarding: Rx         [facility]              Priscilla Williams [id number], 39 year old, female, Telephone Information:       Home Phone      [phone]       Work Phone      [phone]       Mobile          Not on file.                     CALL BACK NUMBER: [phone]       Best time to call back:        Cell phone:        Other phone:              Available times:              Patient's language of care: English              Patient does not need an interpreter.              Patient's PCP: Earl Lites. Darrol Angel, MD              Person calling on behalf of patient: Patient (self)              Calls today with questions and concerns.       mometasone (NASONEX) 50 MCG/ACT nasal spray        Her insurance won't cover this Rx. Wants to know has another rx she can take.              Patient's Preferred Pharmacy:        [facility] [address] [place]              Phone: [phone] Fax: [phone]

## 2010-04-11 ENCOUNTER — Encounter (HOSPITAL_BASED_OUTPATIENT_CLINIC_OR_DEPARTMENT_OTHER): Payer: Self-pay | Admitting: Family Medicine

## 2010-10-22 ENCOUNTER — Other Ambulatory Visit (HOSPITAL_BASED_OUTPATIENT_CLINIC_OR_DEPARTMENT_OTHER): Payer: Self-pay | Admitting: Family Medicine

## 2010-10-22 MED ORDER — NORETHINDRONE 0.35 MG PO TABS
1.0000 | ORAL_TABLET | Freq: Every day | ORAL | Status: DC
Start: 2010-10-22 — End: 2011-08-13

## 2010-10-22 NOTE — Telephone Encounter (Signed)
[facility]    Person calling on behalf of patient: Patient (self)    May list multiple medications in this section  Medicine Name: jolivette  Dosage: 0.35 mg tablets  Frequency (how many pills, how many times a day): take 1 tablet by mouth daily  Number of pills left: 0  Documented patient preferred pharmacies:  [facility] [address] [place]: [phone] Fax: [phone]

## 2011-01-27 ENCOUNTER — Other Ambulatory Visit (HOSPITAL_BASED_OUTPATIENT_CLINIC_OR_DEPARTMENT_OTHER): Payer: Self-pay | Admitting: Family Medicine

## 2011-04-11 ENCOUNTER — Other Ambulatory Visit (HOSPITAL_BASED_OUTPATIENT_CLINIC_OR_DEPARTMENT_OTHER): Payer: Self-pay | Admitting: Family Medicine

## 2011-04-12 NOTE — Telephone Encounter (Signed)
PER Pharmacy, Priscilla Williams is a 40 year old female has requested a refill of Fioricet.    Last Office Visit: 03/05/2010    Last Physical Exam: 06/30/2009      Other Med Adult:  Most Recent BP Reading(s)  03/05/10 : 130/80        Cholesterol (mg/dl)   Date     Date  Value    01/19/2008  239*   ----------    LDL (mg/dl)   Date     Date  Value    01/19/2008  150*   ----------    HDL (mg/dl)   Date     Date  Value    01/19/2008  67    ----------    No results found for this basename: tg        THYROID SCREEN TSH (uIU/mL)   Date     Date  Value    01/19/2008  1.03    ----------        No results found for this basename: TSH      No results found for this basename: hgba1c        No results found for this basename: INR       Documented patient preferred pharmacies:  [facility] [address]: [phone] Fax: [phone]

## 2011-05-08 ENCOUNTER — Ambulatory Visit (HOSPITAL_BASED_OUTPATIENT_CLINIC_OR_DEPARTMENT_OTHER): Payer: Commercial Managed Care - HMO | Admitting: Family Medicine

## 2011-05-08 ENCOUNTER — Ambulatory Visit (HOSPITAL_BASED_OUTPATIENT_CLINIC_OR_DEPARTMENT_OTHER): Payer: Commercial Managed Care - HMO

## 2011-05-08 ENCOUNTER — Telehealth (HOSPITAL_BASED_OUTPATIENT_CLINIC_OR_DEPARTMENT_OTHER): Payer: Self-pay

## 2011-05-08 VITALS — BP 116/80 | HR 68 | Temp 97.7°F | Wt 198.0 lb

## 2011-05-08 DIAGNOSIS — Z7189 Other specified counseling: Secondary | ICD-10-CM

## 2011-05-08 DIAGNOSIS — S0340XA Sprain of jaw, unspecified side, initial encounter: Secondary | ICD-10-CM

## 2011-05-08 DIAGNOSIS — H9209 Otalgia, unspecified ear: Secondary | ICD-10-CM

## 2011-05-08 NOTE — Patient Instructions (Addendum)
Temporomandibular Joint (TMJ) Pain  Your exam shows that you have a problem with your TMJ, the joint that moves when you open your mouth or chew food. TMJ problems can result from direct injuries, bite abnormalities, or tension states which cause you to grind or clench your teeth. Typical symptoms include pain around the joint, clicking, restricted movement, and headaches.  The TMJ is like any other joint in the body; when it is strained, it needs rest to repair itself. To keep the joint at rest it is important that you do not open your mouth wider than the width of your index finger. If you must yawn, be sure to support your chin with your hand so your mouth does not open wide. Eat a soft diet (nothing firmer than ground beef, no raw vegetables), do not chew gum and do not talk if it causes you pain.  Apply topical heat by using a warm, moist cloth placed in front of the ear for 15-20 minutes several times daily. Alternating heat and ice may give even more relief. Anti-inflammatory pain medicine and muscle relaxants can also be helpful. A dental orthotic or splint may be used for temporary relief. Long-term problems may require treatment for stress as well as braces or surgery. Please check with your doctor or dentist if your symptoms do not improve within one week.  Document Released: 07/18/2004 Document Re-Released: 09/04/2009  ExitCare Patient Information 2012 ExitCare, [place].      carbamide peroxide (DEBROX) 6.5 % otic solution - 5 drops to affected ear twice daily

## 2011-05-08 NOTE — Telephone Encounter (Signed)
Priscilla Williams [id number], 40 year old, female, Telephone Information:  Home Phone [phone]  Work Phone [phone]  Mobile Not on file.      CALL BACK NUMBER: [phone]  Best time to call back:   Cell phone:   Other phone:    Available times:    Patient's language of care: English    Patient does not need an interpreter.    Patient's PCP: Earl Lites. Darrol Angel, MD    Person calling on behalf of patient: Patient (self)    TC to this pt who states that she has had an ear problem for one month now and last night she couldn't sleep with the discomfort.Appt scheduled this evening.

## 2011-05-08 NOTE — Progress Notes (Signed)
SUBJECTIVE:  Priscilla Williams is a 40 year old female  With URI symptoms for 30 days including  left sided ear ach for about 1 month. Tingling sensation. Kept her awake last night. Uses qtips.   right ear with similar symptoms x 2 days.   No tinnitus or difficulty hearing.  No Nasal congestion  NO Rhinitis  No Sore throat  No Cough  No Headache  No Myalgia  NO Fever or Chills  No GI symptoms - IBS symptoms stable     Sick Contacts: none   Tobacco: none  OTC/Home remedies: none    OBJECTIVE:  BP 116/80   Pulse 68   Temp(Src) 97.7 F (36.5 C) (Temporal)   Wt 198 lb (89.812 kg)   SpO2 100%  Head: normocephalic, atraumatic  HEENT: pupils equal, round, reactive to light and accomodation.  Extra   ocular muscles intact.    Mucous membranes are moist.   Oropharynx: no erythema of posterior pharynx, no tonsillar swelling, no exudates  Uneven bite  Nares patent, no nasal discharge, no swelling of turbinates    Canals clear, tympanic membranes clear with normal landmarks and positive light reflex   bilaterally.   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 Avoid gum chewing.     388.70B Ear pain  Comment:   Plan:     V65.49AU Advanced care planning/counseling discussion  Comment:   Plan: HEALTH CARE PROXY          Symptom relief

## 2011-05-20 ENCOUNTER — Ambulatory Visit (HOSPITAL_BASED_OUTPATIENT_CLINIC_OR_DEPARTMENT_OTHER): Payer: Commercial Managed Care - HMO | Admitting: Family Medicine

## 2011-05-20 ENCOUNTER — Encounter (HOSPITAL_BASED_OUTPATIENT_CLINIC_OR_DEPARTMENT_OTHER): Payer: Self-pay | Admitting: Family Medicine

## 2011-05-20 VITALS — BP 122/80 | HR 89 | Temp 98.2°F | Resp 16 | Wt 202.0 lb

## 2011-05-20 DIAGNOSIS — Z0000 Encounter for general adult medical examination without abnormal findings: Secondary | ICD-10-CM

## 2011-05-20 DIAGNOSIS — K589 Irritable bowel syndrome without diarrhea: Secondary | ICD-10-CM

## 2011-05-20 DIAGNOSIS — E669 Obesity, unspecified: Secondary | ICD-10-CM

## 2011-05-20 DIAGNOSIS — K625 Hemorrhage of anus and rectum: Secondary | ICD-10-CM

## 2011-05-20 DIAGNOSIS — D649 Anemia, unspecified: Secondary | ICD-10-CM

## 2011-05-20 LAB — CBC, PLATELET & DIFFERENTIAL
ABSOLUTE BASO COUNT: 0 10*3/uL (ref 0.0–0.1)
ABSOLUTE EOSINOPHIL COUNT: 0.1 10*3/uL (ref 0.0–0.8)
ABSOLUTE IMM GRAN COUNT: 0.03 10*3/uL (ref 0.00–0.03)
ABSOLUTE LYMPH COUNT: 2.6 10*3/uL (ref 0.6–5.9)
ABSOLUTE MONO COUNT: 0.9 10*3/uL (ref 0.2–1.4)
ABSOLUTE NEUTROPHIL COUNT: 9.1 10*3/uL — ABNORMAL HIGH (ref 1.6–8.3)
BASOPHIL %: 0.2 % (ref 0.0–1.2)
EOSINOPHIL %: 0.8 % (ref 0.0–7.0)
HEMATOCRIT: 36.1 % (ref 34.1–44.9)
HEMOGLOBIN: 11.6 g/dL (ref 11.2–15.7)
IMMATURE GRANULOCYTE %: 0.2 % (ref 0.0–0.4)
LYMPHOCYTE %: 20.2 % (ref 15.0–54.0)
MEAN CORP HGB CONC: 32.1 g/dL (ref 31.0–37.0)
MEAN CORPUSCULAR HGB: 26.1 pg (ref 26.0–34.0)
MEAN CORPUSCULAR VOL: 81.3 fL (ref 80.0–100.0)
MEAN PLATELET VOLUME: 11.6 fL (ref 8.7–12.5)
MONOCYTE %: 7.1 % (ref 4.0–13.0)
NEUTROPHIL %: 71.5 % (ref 40.0–75.0)
PLATELET COUNT: 342 10*3/uL (ref 150–400)
RBC DISTRIBUTION WIDTH STD DEV: 41 fL (ref 35.1–46.3)
RBC DISTRIBUTION WIDTH: 13.6 % (ref 11.5–14.3)
RED BLOOD CELL COUNT: 4.44 M/uL (ref 3.90–5.20)
WHITE BLOOD CELL COUNT: 12.8 10*3/uL — ABNORMAL HIGH (ref 4.0–11.0)

## 2011-05-20 MED ORDER — DICYCLOMINE HCL 20 MG PO TABS
20.00 mg | ORAL_TABLET | Freq: Four times a day (QID) | ORAL | Status: AC
Start: 2011-05-20 — End: 2011-06-19

## 2011-05-20 MED ORDER — DOXEPIN HCL 10 MG PO CAPS
10.0000 mg | ORAL_CAPSULE | Freq: Every day | ORAL | Status: DC
Start: 2011-05-20 — End: 2011-06-24

## 2011-05-20 NOTE — Progress Notes (Signed)
CC: CPEX    HPI:  Priscilla Williams is a 40 year old female who presents for CPEX    # IBS  Diagnosed by previous PCP - Dr. Dina Rich - about 16 years ago   Onset ~3816 (40 years old), constant diarrhea, bloating, cramping that was so bad she was doubled over; no obvious triggers or patterns - sometimes occurred after meals, sometimes even without having eaten anything; no blood in stool; no constipation;   Had tried cutting out milk, ice cream in past, but without any change   Never tested, just told it was IBS, now wondering if it could be investigated further for other possible causes   Only thing that seemed to help was metamucil - with some benefit - stool more solid; stopped taking about a month ago (forgot to buy it);  Tried immodium but doesn't like it because it just constipates her and she still gets a flare, just later   If knows she will be out and bathroom not readily available, then doesn't eat beforehand    Sister with similar symptoms, not formally diagnosed with IBS    Past week, gotten worse - more bloated, gained weight (4lbs since last visit here 2 weeks), pants feel tighter   Blood in stool - first noticed 2 weeks ago; no melena   - bright red - on toilet paper, and droplets in the water, stool not formed; some streaks of blood in underwear; self resolved; sure it is coming from rectal region - not from vagina   - 3 times total so far  - painless, hasn't felt any hemorrhoids   - never happened before     #weight   Struggling for years  Tried many things, including weight watchers, drops from store   Exercise:  Walk at work mostly, walks most places - with kids to activities, etc; used to go to gym but frustrated and stopped  Diet: no breakfast, salad/sandwich/soup for lunch, dinner varies;  Eats lots of carbs     #anemia with pregnancies, and as child  HCT in our records wnl  Last value from 2009   Occasional LH   Heavy periods, passes large clots, can use super tampon + 2 pads at the same time and  still needs to change q2hrs     #LMP: 05/18/11   Irregular, on OCPs  Heavy, as above     ROS:      Review of Systems   Constitutional: Negative for fever and chills.   Respiratory: Negative for shortness of breath.    Cardiovascular: Negative for chest pain and palpitations.        MEDICATIONS:    Current outpatient prescriptions ordered prior to encounter:  norethindrone (JOLIVETTE) 0.35 MG tablet Take 1 tablet by mouth daily. Disp: 28 tablet Rfl: 11   naproxen (NAPROSYN) 500 MG tablet Take 1 tablet by mouth 2 (two) times daily with meals. Disp: 60 tablet Rfl: 1       ALLERGIES:  Review of Patient's Allergies indicates:  No Known Allergies      Past Medical History    Obesity     Anemia     Nasal polyps 03/22/2010       Patient Active Problem List:     Migraine [346.90A]     Heartburn [787.1]     Irritable Bowel Syndrome [564.1]     Obesity (BMI 30.0-39.9) [278.00AE]     Routine general medical examination at a health care facility [V70.0]  Nasal polyps [471.9K]     Trochanteric bursitis [726.5S]    PHYSICAL EXAM:    BP 122/80   Pulse 89   Temp(Src) 98.2 F (36.8 C) (Temporal)   Resp 16   Wt 202 lb (91.627 kg)   SpO2 100%    Constitutional: NAD, obese  HEENT: NC, AT, EOMI, moist mucous membranes  Cardiovascular: Normal rate and rhythm, No murmurs, No rubs, No gallops;   Breasts:  Symmetric, no dimpling, no nipple discharge, no masses, nontender, no lymph nodes  Thorax & Lungs: CTAB, No respiratory distress, No wheezes, rales or rhonchi,   Abdomen: normoactive BS; Soft, non tender, non distended, No guarding or rebound, No masses.    Extremities: No gross deformities, no edema.  Genitourinary, Rectal:   deferred  Neurologic:  No focal deficits noted.   Skin: warm, dry, no rashes    ASSESSMENT AND PLAN  Priscilla Williams is a 40 year old female with PMH of     V70.0 Routine general medical examination at a health care facility  (primary encounter diagnosis)  Plan:  - Pap smear:  Patient to see Ob/gyn next month,  would prefer it be done there            - tetanus UTD            - deferred flu shot            - LDL (2009) 150;  Goal <160    564.1 Irritable bowel syndrome  Comment: poorly controlled, worsening symptoms in past week  Plan: doxepin (SINEQUAN) 10 MG capsule, dicyclomine         (BENTYL) 20 MG tablet         Given BMJ article on IBS          Will keep food diary for review at next visit     569.3K Bright red blood per rectum  Comment: patient deferred anoscopy because she is currently having her period; no red flags  Plan: counseled patient on possible anoscopy at next visit.  Will monitor     285.9Y Anemia  Comment: h/o anemia with very heavy menses x1 year   Plan: CBC + PLT + AUTO DIFF      278.00J Obesity  Plan:   Exercise diary  F/u periodically for continual lifestyle change coaching  Exercise goal prior to next visit:  Walk home from work instead of driving (uphill);  At least 2 times per week    Followup: 1 month   #at next visit:  IBS: review food diary, f/u s/s, meds   Weight: review exercise diary; establish new goals - diet goal - decrease carbs   Assess mood, anxiety, depression  F/u pap smear, ob/gyn visit     Plan discussed and reviewed with Dr. Florentina Jenny, MD    We discussed Bentyl and Doxepin and the importance of medication compliance. The patient was ready to learn and no apparent learning barriers were identified. I explained the diagnosis and treatment plan, and the patient expressed understanding of the content. Possible side effects of the prescribed medication(s) were explained.  I attempted to answer any questions regarding the diagnosis and the proposed treatment.    1. The patient indicates understanding of these issues and agrees with the plan.  2.  The patient is given an After Visit Summary sheet that lists all of their medications with directions, their allergies, orders placed during this encounter, immunization dates, and follow-  up instructions.  3. I reviewed the  patient's medical information and medical history   4.  I reconciled the patient's medication list and prepared and supplied needed refills.  5.  I have reviewed the past medical, family, and social history sections including the medications and allergies listed in the above medical record    Electronically signed by: Geanie Cooley, 05/20/2011 2:33 PM  This note is electronically signed in the electronic medical record.

## 2011-05-20 NOTE — Progress Notes (Signed)
Here for pe and f/u of IBS . Will try low dose doxepin, probiotic(bifidobacterium) and bentyl as antispasmodic.  Will fu rectal bleeding with anoscopy soon

## 2011-05-20 NOTE — Patient Instructions (Signed)
For IBS, please do the following:   1.  Read article   2.  Start Doxepin daily  3.  Start Bentyl as needed for abdominal pain  4.  Start probiotics (bifidobacterium bifidum)    Weight:   1.  Exercise goals: walk home from work instead of driving.  ([place])

## 2011-05-22 ENCOUNTER — Telehealth (HOSPITAL_BASED_OUTPATIENT_CLINIC_OR_DEPARTMENT_OTHER): Payer: Self-pay | Admitting: Family Medicine

## 2011-05-22 NOTE — Telephone Encounter (Signed)
Returned patient's call regarding questions about the message Dr. Dayle Points left on her voicemail. The Dr. Left the message that patient's WBC was elevated and wanted her to call to see if she had any symptoms such as a fever or chills. Patient denies any signs or symptoms of any problems. States she feels fine. I instructed her if she did develop any symptoms to please call us. Otherwise, keep appointment scheduled with Dr. Dayle Points on the 28th of December.

## 2011-05-22 NOTE — Telephone Encounter (Signed)
Message copied by Emelda Brothers on Wed May 22, 2011  6:30 PM  ------       Message from: Idell Pickles       Created: Wed May 22, 2011  4:16 PM       Regarding: Re: Pt missed a call from Dr. Dayle Points       Contact: [phone]         [facility]              Priscilla Williams [id number], 40 year old, female, Telephone Information:       Home Phone      [phone]       Work Phone      [phone]       Mobile          [phone]                     Cleotis Lema NUMBER: [phone]       Best time to call back: anytime       Cell phone:        Other phone:              Available times:              Patient's language of care: English              Patient does not need an interpreter.              Patient's PCP: Geanie Cooley              Person calling on behalf of patient: Patient (self)              Calls today to speak to provider only. Pt just missed a call from Dr. Dayle Points, would like to speak with MD to discuss health maintenance, please advise. Thanks              Patient's Preferred Pharmacy:        [facility] [address] [place]              Phone: [phone] Fax: [phone]              [facility] [address] [place]              Phone: [phone] Fax: [phone]

## 2011-06-21 ENCOUNTER — Ambulatory Visit (HOSPITAL_BASED_OUTPATIENT_CLINIC_OR_DEPARTMENT_OTHER): Payer: Commercial Managed Care - HMO | Admitting: Family Medicine

## 2011-06-24 ENCOUNTER — Other Ambulatory Visit (HOSPITAL_BASED_OUTPATIENT_CLINIC_OR_DEPARTMENT_OTHER): Payer: Self-pay | Admitting: Family Medicine

## 2011-06-24 DIAGNOSIS — K589 Irritable bowel syndrome without diarrhea: Secondary | ICD-10-CM

## 2011-06-24 MED ORDER — DOXEPIN HCL 10 MG PO CAPS
10.0000 mg | ORAL_CAPSULE | Freq: Every day | ORAL | Status: DC
Start: 2011-06-24 — End: 2011-08-13

## 2011-06-24 NOTE — Telephone Encounter (Signed)
Completely out

## 2011-08-10 ENCOUNTER — Other Ambulatory Visit (HOSPITAL_BASED_OUTPATIENT_CLINIC_OR_DEPARTMENT_OTHER): Payer: Self-pay | Admitting: Family Medicine

## 2011-08-10 NOTE — Telephone Encounter (Signed)
Per Pharmacy :    Corlene Sabia is a 41 year old female has requested a refill of Birth Control .    Last Physical Exam 05/20/11    Last Office Visit 05/20/11    Other Med Adult:  Most Recent BP Reading(s)  05/20/11 : 122/80        Cholesterol (mg/dl)   Date     Date  Value    01/19/2008  239*   ----------    LDL (mg/dl)   Date     Date  Value    01/19/2008  150*   ----------    HDL (mg/dl)   Date     Date  Value    01/19/2008  67    ----------    No results found for this basename: tg        THYROID SCREEN TSH (uIU/mL)   Date     Date  Value    01/19/2008  1.03    ----------        No results found for this basename: TSH      No results found for this basename: hgba1c        No results found for this basename: INR       Documented patient preferred pharmacies:  [facility] [address] [place]: [phone] Fax: [phone]  [facility] [address] [place]: [phone] Fax: [phone]

## 2011-08-12 NOTE — Telephone Encounter (Signed)
Per Pharmacy :    Priscilla Williams is a 41 year old female has requested a refill of Doxepin 10 mg .    Last Physical Exam 05/20/11    Last Office Visit 05/20/11    Other Med Adult:  Most Recent BP Reading(s)  05/20/11 : 122/80        Cholesterol (mg/dl)   Date     Date  Value    01/19/2008  239*   ----------    LDL (mg/dl)   Date     Date  Value    01/19/2008  150*   ----------    HDL (mg/dl)   Date     Date  Value    01/19/2008  67    ----------    No results found for this basename: tg        THYROID SCREEN TSH (uIU/mL)   Date     Date  Value    01/19/2008  1.03    ----------        No results found for this basename: TSH      No results found for this basename: hgba1c        No results found for this basename: INR       Documented patient preferred pharmacies:  [facility] [address] [place]: [phone] Fax: [phone]  [facility] [address] [place]: [phone] Fax: [phone]

## 2011-08-13 ENCOUNTER — Other Ambulatory Visit (HOSPITAL_BASED_OUTPATIENT_CLINIC_OR_DEPARTMENT_OTHER): Payer: Self-pay | Admitting: Family Medicine

## 2011-08-13 DIAGNOSIS — K589 Irritable bowel syndrome without diarrhea: Secondary | ICD-10-CM

## 2011-08-13 MED ORDER — DOXEPIN HCL 10 MG PO CAPS
10.0000 mg | ORAL_CAPSULE | Freq: Every day | ORAL | Status: DC
Start: 2011-08-13 — End: 2011-09-20

## 2011-08-13 MED ORDER — NORETHINDRONE 0.35 MG PO TABS
1.0000 | ORAL_TABLET | Freq: Every day | ORAL | Status: AC
Start: 2011-08-13 — End: 2012-08-14

## 2011-08-13 NOTE — Telephone Encounter (Signed)
She is completely out of both,  Pls send asap

## 2011-08-24 ENCOUNTER — Other Ambulatory Visit (HOSPITAL_BASED_OUTPATIENT_CLINIC_OR_DEPARTMENT_OTHER): Payer: Self-pay | Admitting: Family Medicine

## 2011-08-24 NOTE — Telephone Encounter (Signed)
Will not refill fioricet as it is an addicting medication not first line for migraines.  Can discuss further refills with PCP next week.

## 2011-08-24 NOTE — Telephone Encounter (Signed)
[facility]    Person calling on behalf of patient: Patient (self)    May list multiple medications in this section  Medicine Name: butalbital-acetaminophen-caffeine   Dosage:   Frequency (how many pills, how many times a day): TAKE 1 TABLET BY MOUTH EVERY 4 HOURS AS NEEDED FOR PAIN  Number of pills left:   Documented patient preferred pharmacies:  [facility] [address] [place]: [phone] Fax: [phone]  [facility] [address] [place]: [phone] Fax: [phone]

## 2011-08-30 ENCOUNTER — Telehealth (HOSPITAL_BASED_OUTPATIENT_CLINIC_OR_DEPARTMENT_OTHER): Payer: Self-pay

## 2011-08-30 MED ORDER — SUMATRIPTAN SUCCINATE 50 MG PO TABS
50.0000 mg | ORAL_TABLET | ORAL | Status: DC | PRN
Start: 2011-08-30 — End: 2014-04-11

## 2011-08-30 NOTE — Telephone Encounter (Signed)
','<  More Detail >>   From Elie Confer   Sent Friday August 30, 2011 3:39 PM   To [facility] Rn Pool   Phone [phone]   Subject returning call   Patient Priscilla Williams [[id number]] (DOB: 10-07-1970)   Phone Entered Pt Work Pt Home     [phone]           Message [facility]    [name] [id number], 41 year old, female, Telephone Information:  Home Phone [phone]  Work Phone [phone]  Mobile [phone]      Patient's Preferred Pharmacy:   [facility] [address] [place]    Phone: [phone] Fax: [phone]      CONFIRMED TODAY: Yes    CALL BACK NUMBER: [phone]  Best time to call back:   Cell phone:   Other phone:    Available times:    Patient's language of care: English    Patient does not need an interpreter.    Patient's PCP: Geanie Cooley MD    Person calling on behalf of patient: Patient (self)    Calls today Returning phone call  Returning call that she will be by to pick up script today or tomorrow. She made apt for 4/14 (next avail) w/pcp.

## 2011-08-30 NOTE — Telephone Encounter (Signed)
Wants Fioricet refill. Says it's the only migraine medication that has helped.  Doesn't really care which medication she uses, as long as it gets rid of the headaches    Migraines since teenager. Gotten better over the years, if "caught in time."  +aura, occasional vomiting and loss of vision.  Occur one or twice a month.      Has tried fiorinal, also other meds, but doesn't remember the names.  Has not taken triptans.      Discussed various options with patient and decided on sumatriptan.  Advised patient to take it immediately on onset and advised on side effects.  Also noted that patient should schedule an appointment in about 1 month to follow up and discuss migraines in further detail    Geanie Cooley MD, 08/30/2011, 3:13 PM

## 2011-08-30 NOTE — Telephone Encounter (Signed)
','<  More Detail >>   From Doran Heater   Sent Friday August 30, 2011 11:34 AM   To Robet Leu   Phone [phone]   Subject Pt needs call from PCP   Patient Priscilla Williams [[id number]] (DOB: 1970/08/29)   Phone Entered Pt Work Pt Home     [phone]           Message [facility]    Kanyia Heaslip [id number], 41 year old, female, Telephone Information:  Home Phone [phone]  Work Phone [phone]  Mobile [phone]      Patient's Preferred Pharmacy:   [facility] [address] [place]    Phone: [phone] Fax: [phone]    [facility] [address]     Phone: [phone] Fax: [phone]      CONFIRMED TODAY: Yes    CALL BACK NUMBER: [phone]  Best time to call back: anytime  Cell phone:   Other phone:    Available times:    Patient's language of care: English    Patient does not need an interpreter.    Patient's PCP: Geanie Cooley MD    Person calling on behalf of patient: Patient (self)    Calls today for med refill(s).This pt requested a refill for her migrane medication on Saturday and called today because it has not been filled yet. Dr Lauree Chandler left a message in EPIC Will not refill fioricet as it is an addicting medication not first line for migraines. Can discuss further refills with PCP next week. Can we please have Dr Derinda Sis call the pt back today. Thx     Will forward to Dr. Dayle Points  Who will be seeing pt's this PM in the clinic.

## 2011-09-02 ENCOUNTER — Encounter (HOSPITAL_BASED_OUTPATIENT_CLINIC_OR_DEPARTMENT_OTHER): Payer: Self-pay | Admitting: Student in an Organized Health Care Education/Training Program

## 2011-09-02 ENCOUNTER — Ambulatory Visit (HOSPITAL_BASED_OUTPATIENT_CLINIC_OR_DEPARTMENT_OTHER): Payer: Commercial Managed Care - HMO | Admitting: Student in an Organized Health Care Education/Training Program

## 2011-09-02 VITALS — BP 126/84 | HR 97 | Temp 98.0°F

## 2011-09-02 DIAGNOSIS — M545 Low back pain, unspecified: Secondary | ICD-10-CM

## 2011-09-02 MED ORDER — CYCLOBENZAPRINE HCL 5 MG PO TABS
5.00 mg | ORAL_TABLET | Freq: Three times a day (TID) | ORAL | Status: AC | PRN
Start: 2011-09-02 — End: 2011-09-09

## 2011-09-02 NOTE — Progress Notes (Signed)
PRECEPTOR NOTE:  On the day of the patient's visit, I discussed the key elements of history and physical exam and I reviewed the findings with the resident.  I agree with the assessment and plan as described in their documentation.  Please see resident's note for further details.

## 2011-09-02 NOTE — Progress Notes (Signed)
Subjective  Priscilla Williams is a 41 year old female here for    Back pain- started 3 days ago.  Her car got stuck in all the snow and she was shoveling a lot.  She noticed the pain after that, and since then has been in constant pain. Lower R back pain, radiates a little into tailbone.    Exacerbated by:  Movement, standing.  Relieved by: laying down. Ibuprofen helps a little.   Hot packs and massage don't help.    Has never had back problems before.  No trauma, just the overuse.    ROS: No weakness, numbness, or bowel/bladder symptoms.      Physical Exam   Constitutional: Pt oriented to person, place, and time. Appears well-developed and well-nourished. No distress.   HENT:   Head: Normocephalic and atraumatic.   BACK: no visual deformity, asymmetry or abnormality  No spinal bony tenderness  + paraspinous muscle spasm bilaterally in lumbar region.  ROM: reduced x 6 directions 2/2 pain.  Neg SLR bilaterally  2+ patellar reflexes  Gait antalgic.  Neurological: Oriented to person, place, and time. Sensation and strength intact. No cranial nerve deficit. Coordination normal.   Skin: Skin is warm and dry. No rash noted.Not diaphoretic. No erythema. No pallor.   Psychiatric: Normal mood and affect. Behavior is normal.     Assessment and Plan:  724.2 Lumbago  (primary encounter diagnosis)  Comment: likely strain from shoveling.  No red flag sx, no trauma.  Plan: Can continue Motrin (Risks and benefits, side effects discussed) and prescribed Flexeril.  We discussed Flexeril and the importance of medication compliance. The patient was ready to learn and no apparent learning barriers were identified. I explained the diagnosis and treatment plan, and the patient expressed understanding of the content. Possible side effects of the prescribed medication(s) were explained, including dizziness, confusion-- do not take when driving or operating any machinery.  I attempted to answer any questions regarding the diagnosis and the  proposed treatment.  Also advised rest for first week, then PT-- pt declined PT referral at this point.  Discussed red flag symptoms to call right away for.        I have reviewed the past medical, surgical, social and family history and updated these sections of EpicCare as relevant. All interim labs, test results, and consult notes were reviewed and discussed with pt. Medications were reconciled during this visit and a current medication list was given to the patient at the end of the visit.    Updated med list, follow-up instructions, and after visit summary printed for pt.    Ramond Craver, MD

## 2011-09-20 ENCOUNTER — Other Ambulatory Visit (HOSPITAL_BASED_OUTPATIENT_CLINIC_OR_DEPARTMENT_OTHER): Payer: Self-pay | Admitting: Family Medicine

## 2011-09-20 NOTE — Telephone Encounter (Signed)
PER Pharmacy, Priscilla Williams is a 41 year old female has requested a refill of doxepin 10mg .      Last Office Visit: 09/02/11  Last Physical Exam: 05/20/11      Other Med Adult:  Most Recent BP Reading(s)  09/02/11 : 126/84        Cholesterol (mg/dl)   Date     Date  Value    01/19/2008  239*   ----------    LDL (mg/dl)   Date     Date  Value    01/19/2008  150*   ----------    HDL (mg/dl)   Date     Date  Value    01/19/2008  67    ----------    No results found for this basename: tg        THYROID SCREEN TSH (uIU/mL)   Date     Date  Value    01/19/2008  1.03    ----------        No results found for this basename: TSH      No results found for this basename: hgba1c        No results found for this basename: INR       Documented patient preferred pharmacies:  [facility] [address] [place] [phone] Fax: [phone]

## 2011-10-04 ENCOUNTER — Ambulatory Visit (HOSPITAL_BASED_OUTPATIENT_CLINIC_OR_DEPARTMENT_OTHER): Payer: Commercial Managed Care - HMO | Admitting: Family Medicine

## 2012-03-30 ENCOUNTER — Other Ambulatory Visit (HOSPITAL_BASED_OUTPATIENT_CLINIC_OR_DEPARTMENT_OTHER): Payer: Self-pay | Admitting: Family Medicine

## 2012-03-31 NOTE — Progress Notes (Signed)
PER Pharmacy, Karita Bumgardner is a 41 year old female has requested a refill of doxepin 10mg .      Last Office Visit: 09/02/11  Last Physical Exam: N/A      Other Med Adult:  Most Recent BP Reading(s)  09/02/11 : 126/84        Cholesterol (mg/dl)   Date  Value    1/61/0960  239*   ----------    LDL DIRECT (mg/dl)   Date  Value    4/54/0981  150*   ----------    HDL (mg/dl)   Date  Value    1/91/4782  67    ----------    No results found for this basename: tg        THYROID SCREEN TSH (uIU/mL)   Date  Value    01/19/2008  1.03    ----------        No results found for this basename: TSH      No results found for this basename: hgba1c        No results found for this basename: INR       Documented patient preferred pharmacies:    [id number] - [place], [place] - [address]  Phone: [phone] Fax: [phone]

## 2012-04-01 NOTE — Progress Notes (Signed)
Geanie Cooley MD, 04/01/2012, 12:25 PM  Done

## 2012-04-21 ENCOUNTER — Other Ambulatory Visit (HOSPITAL_BASED_OUTPATIENT_CLINIC_OR_DEPARTMENT_OTHER): Payer: Self-pay | Admitting: Family Medicine

## 2012-04-21 DIAGNOSIS — R12 Heartburn: Secondary | ICD-10-CM

## 2012-04-21 MED ORDER — RANITIDINE HCL 150 MG PO TABS
150.0000 mg | ORAL_TABLET | Freq: Every day | ORAL | Status: DC
Start: 2012-04-21 — End: 2013-05-25

## 2012-08-10 NOTE — Progress Notes (Signed)
.  can you close encounter, old one!   I am not able to since meds are pending??

## 2012-11-23 ENCOUNTER — Other Ambulatory Visit (HOSPITAL_BASED_OUTPATIENT_CLINIC_OR_DEPARTMENT_OTHER): Payer: Self-pay | Admitting: Ambulatory Care

## 2012-11-23 NOTE — Telephone Encounter (Signed)
Called patient and got voicemail, left message to call clinic back to schedule an appt.

## 2013-04-16 ENCOUNTER — Other Ambulatory Visit (HOSPITAL_BASED_OUTPATIENT_CLINIC_OR_DEPARTMENT_OTHER): Payer: Self-pay | Admitting: Family Medicine

## 2013-04-16 MED ORDER — DOXEPIN HCL 10 MG PO CAPS
10.0000 mg | ORAL_CAPSULE | Freq: Every day | ORAL | Status: DC
Start: 2013-04-16 — End: 2013-05-25

## 2013-04-16 NOTE — Progress Notes (Signed)
Rx filled as requested - one month supply as patient hasn't been to the office in over 1 year and needs to schedule appt to establish care with new PCP  SOLOMON,Tayona Sarnowski MD, 04/16/2013, 4:37 PM

## 2013-05-17 ENCOUNTER — Other Ambulatory Visit (HOSPITAL_BASED_OUTPATIENT_CLINIC_OR_DEPARTMENT_OTHER): Payer: Self-pay | Admitting: Family Medicine

## 2013-05-17 NOTE — Telephone Encounter (Signed)
Refill refused as pt needs to be seen, hasn't been here in >1 year.  Please assist pt with scheduling f/u appt.

## 2013-05-25 ENCOUNTER — Ambulatory Visit (HOSPITAL_BASED_OUTPATIENT_CLINIC_OR_DEPARTMENT_OTHER): Payer: Commercial Managed Care - HMO | Admitting: Family Medicine

## 2013-05-25 ENCOUNTER — Telehealth (HOSPITAL_BASED_OUTPATIENT_CLINIC_OR_DEPARTMENT_OTHER): Payer: Self-pay | Admitting: Family Medicine

## 2013-05-25 VITALS — BP 118/64 | HR 95 | Temp 98.5°F | Wt 203.0 lb

## 2013-05-25 DIAGNOSIS — K589 Irritable bowel syndrome without diarrhea: Secondary | ICD-10-CM

## 2013-05-25 DIAGNOSIS — R12 Heartburn: Secondary | ICD-10-CM

## 2013-05-25 DIAGNOSIS — F419 Anxiety disorder, unspecified: Secondary | ICD-10-CM

## 2013-05-25 DIAGNOSIS — G43909 Migraine, unspecified, not intractable, without status migrainosus: Secondary | ICD-10-CM

## 2013-05-25 MED ORDER — DOXEPIN HCL 10 MG PO CAPS
10.0000 mg | ORAL_CAPSULE | Freq: Every day | ORAL | Status: DC
Start: 2013-05-25 — End: 2013-09-03

## 2013-05-25 MED ORDER — RIBOFLAVIN 400 MG PO CAPS
1.0000 | ORAL_CAPSULE | Freq: Every day | ORAL | Status: DC
Start: 2013-05-25 — End: 2014-04-11

## 2013-05-25 MED ORDER — BUTALBITAL-APAP-CAFFEINE 50-325-40 MG PO TABS
ORAL_TABLET | ORAL | Status: AC
Start: 2013-05-25 — End: 2013-06-25

## 2013-05-25 MED ORDER — MAGNESIUM SALICYLATE 600 MG PO TABS
1.00 | ORAL_TABLET | Freq: Every day | ORAL | Status: AC
Start: 2013-05-25 — End: 2013-06-24

## 2013-05-25 MED ORDER — RANITIDINE HCL 150 MG PO TABS
150.0000 mg | ORAL_TABLET | Freq: Every day | ORAL | Status: DC
Start: 2013-05-25 — End: 2014-04-11

## 2013-05-25 NOTE — Progress Notes (Signed)
42 year old female here today for med reconciliation.     Meds:  1. Doxepin   Uses it for IBS.     2. Imitrex    Having AE - has burning in extremities/arms/legs   Doesn't stop migraines.    Was on Fiorcet in past - but was switched to Imitrex.    Triggers:     Heat, Hormonal, Stress (a lot right now), Lack of sleep, eating     Mother of 4 kids     2 jobs (Full time, Part time)      Walks a lot for work     Spouse - history of cardiac surgery     Landlord issues   HA occurs two times/week     Preventive Care:  Ob/Gyn   Dr. Jacqualyn Posey @ [facility]   Recent pap 01/2013 - Normal   To schedule Breast U/S and Mammogram through them.        Review of Systems   Eyes: Negative.    Cardiovascular: Negative for palpitations.   Neurological: Positive for headaches.     Vital Signs:  BP 118/64   Pulse 95   Temp(Src) 98.5 F (36.9 C) (Temporal)   Wt 203 lb (92.08 kg)   BMI 38.38 kg/m2   SpO2 100%   LMP 05/02/2013    Physical Exam   Constitutional: She appears well-developed and well-nourished.   Pulmonary/Chest: Effort normal.   Neurological: She is alert.     Assessment and Plan  (346.90) Migraine  (primary encounter diagnosis)  (300.00) Anxiety  Comment: Spent some time discussing different treatment options.  Seems like there is a lot of external stress in her life - which appears to be her main trigger. Agreed to try Riboflavin and Mg.  Since having side effects with Imitrex will give script for Fioricet (which worked well for her in the past).  Discussed that there is a barbiturate component, which is probably why it was switched in the first place, which is a controlled substance.  Given all her stress, discuss option for psych therapy and importance of self-care - she agreed to referral.   Plan:  Riboflavin 400 MG CAPS,    Magnesium Salicylate 600 MG TABS,    butalbital-acetaminophen-caffeine (FIORICET, ESGIC) per tablet   REFERRAL TO ADULT PSYCHIATRY ( INT)    (564.1) Irritable bowel syndrome  Comment:  Refilled medication.   Plan: doxepin (SINEQUAN) 10 MG capsule      (787.1) Heartburn  Comment: Requested refill  Plan: ranitidine (ZANTAC) 150 MG tablet     I have spent 25 minutes in face to face time with this patient/patient proxy of which > 50% was in counseling or coordination of care regarding above issues/Dx.    1. The patient indicates understanding of these issues and agrees with the plan.   2. The patient is given an After Visit Summary sheet that lists all of their medications with directions, their allergies, orders placed during this encounter, immunization dates, and follow- up instructions.   3. I reviewed the patient's medical information, allergies, and social history and reconciled the patient's medication list.

## 2013-05-25 NOTE — Progress Notes (Addendum)
Priscilla Williams [id number], 42 year old, female, Telephone Information:   Home Phone [phone]   Work Phone Not on file.   Mobile [phone]       Patient's Preferred Pharmacy:     [facility] - [place], [place] - [address]  Phone: [phone] Fax: [phone]    [facility] - [address] [place], [place] - [address]  Phone: [phone] Fax: [phone]      CONFIRMED TODAY: Lovett Sox NUMBER: [phone]    Patient's language of care: English    Patient does not need an interpreter.    Patient's PCP: Geanie Cooley MD    Person calling on behalf of patient: Patient (self)    Calls today because Magnesium Salicylate 600 MG TABS does not come in 600 MG tabs

## 2013-05-28 NOTE — Progress Notes (Addendum)
Called and Left VM on requested call back number.   Advised her to discuss dosing with pharmacy and to call us with further questions.     Upcoming appt with PCP - 06/21/13   Bayard Males, MD, 05/28/2013, 4:45 PM

## 2013-06-21 ENCOUNTER — Ambulatory Visit (HOSPITAL_BASED_OUTPATIENT_CLINIC_OR_DEPARTMENT_OTHER): Payer: Commercial Managed Care - HMO | Admitting: Family Medicine

## 2013-07-02 ENCOUNTER — Telehealth (HOSPITAL_BASED_OUTPATIENT_CLINIC_OR_DEPARTMENT_OTHER): Payer: Self-pay | Admitting: Family Medicine

## 2013-07-02 NOTE — Progress Notes (Signed)
Left message on pt's VM.  Re: scheduling an appt with Psychotherapy

## 2013-07-09 ENCOUNTER — Ambulatory Visit (HOSPITAL_BASED_OUTPATIENT_CLINIC_OR_DEPARTMENT_OTHER): Payer: Commercial Managed Care - HMO | Admitting: Family Medicine

## 2013-07-23 ENCOUNTER — Telehealth (HOSPITAL_BASED_OUTPATIENT_CLINIC_OR_DEPARTMENT_OTHER): Payer: Self-pay | Admitting: Family Medicine

## 2013-07-23 NOTE — Progress Notes (Signed)
Left message on pt's VM. Re: scheduling an appt with Psychotherapy

## 2013-07-29 ENCOUNTER — Telehealth (HOSPITAL_BASED_OUTPATIENT_CLINIC_OR_DEPARTMENT_OTHER): Payer: Self-pay | Admitting: Family Medicine

## 2013-07-29 NOTE — Progress Notes (Signed)
Left message on pt's VM. Re: scheduling an appt with Psychotherapy

## 2013-08-17 ENCOUNTER — Other Ambulatory Visit (HOSPITAL_BASED_OUTPATIENT_CLINIC_OR_DEPARTMENT_OTHER): Payer: Self-pay

## 2013-08-17 NOTE — Telephone Encounter (Signed)
Called and left message for pt to call back to schedule an appt for cpex/pap and regarding setting up [place], [place], 08/17/2013, 11:52 AM

## 2013-08-25 ENCOUNTER — Telehealth (HOSPITAL_BASED_OUTPATIENT_CLINIC_OR_DEPARTMENT_OTHER): Payer: Self-pay | Admitting: Family Medicine

## 2013-08-25 DIAGNOSIS — M25569 Pain in unspecified knee: Secondary | ICD-10-CM

## 2013-08-25 NOTE — Progress Notes (Signed)
Appointment scheduled for: Orthopedics (ext)    Specialty Location:    [place]                                                  Specialist's name: Jolyn Nap          Specialist's NPI#: [id number]                           Specialty Phone Number: [phone]     Specialty Fax Number: [phone]    Reason for appointment: knee hurt                                   Date of appt: 08/26/13    Time of appt:    Patient Notification:  Voicemail from pt left on 08/24/13 after 4:30 pm      If you have any questions concerning the referral authorization please call [phone].    If you have any questions concerning your appointment please call the specialty phone number above.

## 2013-09-03 ENCOUNTER — Other Ambulatory Visit (HOSPITAL_BASED_OUTPATIENT_CLINIC_OR_DEPARTMENT_OTHER): Payer: Self-pay | Admitting: Family Medicine

## 2013-09-03 DIAGNOSIS — K589 Irritable bowel syndrome without diarrhea: Secondary | ICD-10-CM

## 2013-09-03 MED ORDER — DOXEPIN HCL 10 MG PO CAPS
10.0000 mg | ORAL_CAPSULE | Freq: Every day | ORAL | Status: DC
Start: 2013-09-03 — End: 2014-04-11

## 2013-09-03 NOTE — Progress Notes (Addendum)
[facility]    Person calling on behalf of patient: Patient (self)    May list multiple medications in this section    Medicine Name: doxepin ([place]) 10 MG capsule    Documented patient preferred pharmacies:       [facility] - [place], [place]  Phone: [phone] Fax: [phone]

## 2013-09-03 NOTE — Progress Notes (Signed)
PER Patient (self), Priscilla Williams is a 43 year old female has requested a refill of doxepin 66m.      Last Office Visit: 05-25-13  Last Physical Exam: n/a      Other Med Adult:  Most Recent BP Reading(s)  05/25/13 : 118/64      Cholesterol (mg/dl)   Date  Value    01/19/2008  239*   ----------  LDL DIRECT (mg/dl)   Date  Value    01/19/2008  150*   ----------  HDL (mg/dl)   Date  Value    01/19/2008  67    ----------  No results found for this basename: tg      THYROID SCREEN TSH REFLEX FT4 (uIU/mL)   Date  Value    01/19/2008  1.03    ----------      No results found for this basename: TSH    No results found for this basename: hgba1c      No results found for this basename: INR       Documented patient preferred pharmacies:    [id number] [place] [address] Phone: [phone]: [address] [place] Phone: [phone]: [phone]

## 2013-09-07 ENCOUNTER — Other Ambulatory Visit (HOSPITAL_BASED_OUTPATIENT_CLINIC_OR_DEPARTMENT_OTHER): Payer: Self-pay

## 2013-09-07 NOTE — Telephone Encounter (Signed)
Called patient, left message to call back to schedule mammo. Letter sent (not certified)  Priscilla Williams, 09/07/2013, 2:02 PM

## 2013-10-13 ENCOUNTER — Encounter (HOSPITAL_BASED_OUTPATIENT_CLINIC_OR_DEPARTMENT_OTHER): Payer: Self-pay | Admitting: Family Medicine

## 2013-10-13 ENCOUNTER — Ambulatory Visit (HOSPITAL_BASED_OUTPATIENT_CLINIC_OR_DEPARTMENT_OTHER): Payer: Commercial Managed Care - HMO | Admitting: Family Medicine

## 2013-10-13 ENCOUNTER — Other Ambulatory Visit (HOSPITAL_BASED_OUTPATIENT_CLINIC_OR_DEPARTMENT_OTHER): Payer: Self-pay

## 2013-10-13 VITALS — BP 120/80 | HR 102 | Temp 98.0°F | Wt 191.0 lb

## 2013-10-13 DIAGNOSIS — D229 Melanocytic nevi, unspecified: Secondary | ICD-10-CM

## 2013-10-13 DIAGNOSIS — L42 Pityriasis rosea: Secondary | ICD-10-CM

## 2013-10-13 MED ORDER — TRIAMCINOLONE ACETONIDE 0.1 % EX CREA
TOPICAL_CREAM | Freq: Two times a day (BID) | CUTANEOUS | Status: AC
Start: 2013-10-13 — End: 2013-11-12

## 2013-10-13 NOTE — Progress Notes (Signed)
Priscilla Williams is a 43 year old female   she has the following chronic problems:    Patient Active Problem List:     Migraine     Heartburn     Irritable Bowel Syndrome     Obesity (BMI 30.0-39.9)     Routine general medical examination at a health care facility     Nasal polyps     Trochanteric bursitis      Subjective:  The patient identified the following agenda items for today's visit:   (those in italics were acknowledged but deferred to another visit due to time constraints):    #) rash   X 2 weeks   Getting worse     Thought it was hives at first, but not going away   Pruritic   Took benadryl PO and hydrocortisone cream without much benefit     Shoulders, back, neck, scalp  1 small patch on right forearm  Thinks it started  Initially on arm and chest    No new contacts, travel   No one else with similar rash   No fevers, SOB, other allergy symptoms   Otherwise feeling well, not sick       --Reviewed and updated PMH/PSH/SH in EPIC as needed.  Medications reviewed and updated in EPIC.  Medication reconciliation completed.      ROS: All other systems negative except as noted above      ALLERGIES:  Review of Patient's Allergies indicates:  No Known Allergies    MEDICATIONS:    Current Outpatient Prescriptions on File Prior to Visit:  doxepin (SINEQUAN) 10 MG capsule Take 1 capsule by mouth daily. Disp: 90 capsule Rfl: 3   Riboflavin 400 MG CAPS Take 1 tablet by mouth daily. Disp: 30 capsule Rfl: 11   sumatriptan (IMITREX) 50 MG tablet Take 1 tablet by mouth as needed for Migraine. Disp: 30 tablet Rfl: 0   naproxen (NAPROSYN) 500 MG tablet Take 1 tablet by mouth 2 (two) times daily with meals. Disp: 60 tablet Rfl: 1     No current facility-administered medications on file prior to visit.      Physical Exam  BP 120/80   Pulse 102   Temp(Src) 98 F (36.7 C) (Temporal)   Wt 191 lb (86.637 kg)   BMI 36.11 kg/m2   SpO2 98%   LMP 10/06/2013   GENERAL: no acute distress, non toxic appearing,  cooperative  HEENT: NCAT, MMM  CHEST: Breathing comfortably  NEURO: Grossly normal   SKIN: torso - chest, abdomen, and back - as well as neck, scalpe with numerous mildly erythematous macular patches of irregular borders and varying sizes, mostly around 62m in diameter with overlying scaling and some scabbing.   Right forearm with similar 2x3cm lesion.       Most Recent Weight Reading(s)  10/13/13 : 191 lb (86.637 kg)  05/25/13 : 203 lb (92.08 kg)  05/20/11 : 202 lb (91.627 kg)    Assessment and Plan:  Priscilla Antwineis a 43year old female with:  1. Pityriasis rosea  Rash most consistent with this.   Re-assured patient, given patient information on this, will likely spontaneously resolve with 4-8 weeks  Rx for kenalog cream  Advised to try calamine lotion   RTC if doesn't resolve in 3 months, or develops other symptoms including fever, joint pain, N/V, SOB    2. Numerous moles  Patient asked at very end of visit for referral to derm; has numerous moles  and noticed new ones, would like to have full evaluation because she was told in past (after severe sunburn) that she was at increased risk for skin cancer  - REFERRAL TO DERMATOLOGY (EXT)        *Patient understood and agreed with plan  *Follow up interval discussed  *AVS given  *All questions addressed  *I have reviewed the past medical, surgical, social and family history and updated these sections of EpicCare as relevant. All interim labs, test results, and consult notes were reviewed and discussed with Priscilla Williams. Medications were reconciled during this visit and a current medication list was given to the patient at the end of the visit.    We discussed the patients current medications. The patient expressed understanding and no barriers to adherence were identified.     This patient's care was discussed with the attending physician    Ronne Binning MD, 10/13/2013, 10:35 AM

## 2013-10-13 NOTE — Progress Notes (Signed)
PRECEPTOR NOTE:  On the day of the patient's visit, I discussed the key elements of history and physical exam and I reviewed the findings with the resident.   Rash: most likely pityriasis rosea. Agree with sx management  I personally saw the patient on the day of the visit.   I agree with the assessment and plan as described in their documentation.  Please see resident's note for further details.

## 2013-10-13 NOTE — Telephone Encounter (Signed)
Mammo Outreach:  Called and left a voicemail message for pt to call the clinic to schedule a mammogram.  Liston Thum N. Shine Mikes, [place], 10/13/2013, 3:17 PM

## 2013-10-18 ENCOUNTER — Encounter (HOSPITAL_BASED_OUTPATIENT_CLINIC_OR_DEPARTMENT_OTHER): Payer: Self-pay | Admitting: Family Medicine

## 2013-11-09 ENCOUNTER — Encounter (HOSPITAL_BASED_OUTPATIENT_CLINIC_OR_DEPARTMENT_OTHER): Payer: Self-pay | Admitting: Family Medicine

## 2013-11-24 ENCOUNTER — Other Ambulatory Visit (HOSPITAL_BASED_OUTPATIENT_CLINIC_OR_DEPARTMENT_OTHER): Payer: Self-pay

## 2013-11-24 NOTE — Telephone Encounter (Signed)
Outreach Mammo:    Called pt to schedule a mammo, pt didn't answer, left a voicemail message for pt to call the clinic.  Malachai Schalk N. Francy Mcilvaine, [place], 11/24/2013, 1:53 PM

## 2014-02-24 ENCOUNTER — Other Ambulatory Visit (HOSPITAL_BASED_OUTPATIENT_CLINIC_OR_DEPARTMENT_OTHER): Payer: Self-pay

## 2014-02-24 NOTE — Telephone Encounter (Signed)
1st attempt, outreach for Mammogram. LVM to call back to schedule an appointment.

## 2014-04-01 ENCOUNTER — Other Ambulatory Visit (HOSPITAL_BASED_OUTPATIENT_CLINIC_OR_DEPARTMENT_OTHER): Payer: Self-pay

## 2014-04-01 NOTE — Telephone Encounter (Signed)
Called and lft msg on vm to call back to book app.  Caryl Ada, 04/01/2014, 1:44 PM

## 2014-04-11 ENCOUNTER — Encounter (HOSPITAL_BASED_OUTPATIENT_CLINIC_OR_DEPARTMENT_OTHER): Payer: Self-pay

## 2014-04-11 ENCOUNTER — Ambulatory Visit (HOSPITAL_BASED_OUTPATIENT_CLINIC_OR_DEPARTMENT_OTHER): Payer: Commercial Managed Care - HMO

## 2014-04-11 VITALS — BP 126/80 | HR 74 | Temp 97.4°F

## 2014-04-11 DIAGNOSIS — K580 Irritable bowel syndrome with diarrhea: Secondary | ICD-10-CM

## 2014-04-11 DIAGNOSIS — R072 Precordial pain: Secondary | ICD-10-CM

## 2014-04-11 DIAGNOSIS — R12 Heartburn: Secondary | ICD-10-CM

## 2014-04-11 LAB — URINE PREGNANCY TEST (POINT OF CARE): HCG QUALITATIVE URINE: NEGATIVE

## 2014-04-11 MED ORDER — DOXEPIN HCL 10 MG PO CAPS
10.0000 mg | ORAL_CAPSULE | Freq: Every day | ORAL | Status: DC
Start: 2014-04-11 — End: 2015-05-03

## 2014-04-11 MED ORDER — RANITIDINE HCL 150 MG PO TABS
150.0000 mg | ORAL_TABLET | Freq: Every day | ORAL | Status: DC
Start: 2014-04-11 — End: 2017-02-21

## 2014-04-11 NOTE — Patient Instructions (Signed)
Take 3 advil(600 mg) three times daily for one week  Return to see our OMT doctors

## 2014-04-11 NOTE — Progress Notes (Signed)
Clinic Note:    SUBJECTIVE:  Priscilla Williams is a 43 year old female with the following problem list:    Patient Active Problem List:     Migraine     Heartburn     Irritable Bowel Syndrome     Obesity (BMI 30.0-39.9)     Routine general medical examination at a health care facility     Nasal polyps     Trochanteric bursitis    Month of central chest pain  Then traveling to left axilla  No noted trauma  Has little muscle-y flutter when takes a deep breath  Not pain or SOB per se  Not a sharp pain  Then last night threw up in the middle of the night and felt nauseous then went into bathroom and got sick  No headache      Takes loestrin daily -    No longer taking imitrex, instead taking butalbital, using   Having migraine maybe once or twice a month,     Got braca testing, wants [place] to know      MEDICATIONS    Current Outpatient Prescriptions on File Prior to Visit:  doxepin (SINEQUAN) 10 MG capsule Take 1 capsule by mouth daily. Disp: 90 capsule Rfl: 3   Riboflavin 400 MG CAPS Take 1 tablet by mouth daily. Disp: 30 capsule Rfl: 11   sumatriptan (IMITREX) 50 MG tablet Take 1 tablet by mouth as needed for Migraine. Disp: 30 tablet Rfl: 0   naproxen (NAPROSYN) 500 MG tablet Take 1 tablet by mouth 2 (two) times daily with meals. Disp: 60 tablet Rfl: 1     No current facility-administered medications on file prior to visit.        OBJECTIVE:  BP 126/80 mmHg   Pulse 74   Temp(Src) 97.4 F (36.3 C) (Temporal)   SpO2 100%        Physical Exam   Constitutional: She appears well-developed and well-nourished. No distress.   HENT:   Head: Atraumatic.   Eyes: EOM are normal. Right eye exhibits no discharge. Left eye exhibits no discharge. No scleral icterus.   Neck: Neck supple.   Cardiovascular: Normal rate and regular rhythm.  Exam reveals no gallop and no friction rub.    No murmur heard.  Pulmonary/Chest: Effort normal and breath sounds normal. No respiratory distress. She has no wheezes. She has no rales. She exhibits no  tenderness.   Musculoskeletal: Normal range of motion.   (+) reproducibility of CP in sternal and left axillary areas with pressure           ASSESSMENT AND PLAN:  1. Precordial pain  Unclear why vomiting, no other infxs signs  CP Appears costochondritis, rec one week of high dose ibuprofen and return for OMT  - EKG  - Urine Pregnancy (Point of Care)      I have reviewed the past medical, surgical, social and family history and updated these sections of EpicCare as relevant. All interim labs, test results, and consult notes were reviewed and discussed with Gabriel Carina. Medications were reconciled during this visit and a current medication list was given to the patient at the end of the visit.    Discussed with Dr. Alfonso Ellis, MD, MPH.  PGY3

## 2014-04-11 NOTE — Progress Notes (Signed)
PRECEPTOR NOTE:  On the day of the patient's visit, I discussed the key elements of the history and physical exam and I reviewed the findings with the resident. I agree with the assessment and plan as described in the documentation. Please see resident's note for further details.

## 2014-04-16 LAB — EKG

## 2014-04-21 ENCOUNTER — Encounter (HOSPITAL_BASED_OUTPATIENT_CLINIC_OR_DEPARTMENT_OTHER): Payer: Self-pay | Admitting: Family Medicine

## 2014-04-21 ENCOUNTER — Ambulatory Visit (HOSPITAL_BASED_OUTPATIENT_CLINIC_OR_DEPARTMENT_OTHER): Payer: Commercial Managed Care - HMO | Admitting: Family Medicine

## 2014-04-21 VITALS — BP 126/84 | HR 88 | Temp 97.9°F

## 2014-04-21 DIAGNOSIS — M9901 Segmental and somatic dysfunction of cervical region: Secondary | ICD-10-CM

## 2014-04-21 DIAGNOSIS — M9908 Segmental and somatic dysfunction of rib cage: Secondary | ICD-10-CM

## 2014-04-21 DIAGNOSIS — M546 Pain in thoracic spine: Secondary | ICD-10-CM

## 2014-04-21 DIAGNOSIS — M9902 Segmental and somatic dysfunction of thoracic region: Secondary | ICD-10-CM

## 2014-04-21 DIAGNOSIS — M999 Biomechanical lesion, unspecified: Secondary | ICD-10-CM

## 2014-04-21 NOTE — Progress Notes (Signed)
[facility]  Office Visit Note   No chief complaint on file.    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    Problem List:   Patient Active Problem List:     Migraine     Heartburn     Irritable Bowel Syndrome     Obesity (BMI 30.0-39.9)     Routine general medical examination at a health care facility     Nasal polyps     Trochanteric bursitis      Objective:   BP 126/84 mmHg   Pulse 88   Temp(Src) 97.9 F (36.6 C) (Temporal)   Wt    SpO2 100%   LMP 03/27/2014  General: alert, appears stated age, cooperative and no distress   BACK:   Gait is normal. Mild thoracic scoliosis .   Palpation: The spinous processes are nontender, the paraspinal muscles are tender in cervical and thoracic regions.  There is no evidence of instability.    There is normal muscle strength and tone.    ROM: The patient has full forward flexion, full extension, full rotation, & full side bending.   Spurling's test is negative.  Deep tendon reflexes are symmetrical  Osteopathic Exam:   C5-C6 RRSRF   T5-T8 RLSR N   T9 RLSL F   L sided rib 8-9 exhalation dysfunction     Assessment & Plan:      1. Left-sided thoracic back pain  2. Nonallopathic lesion of thoracic region  3. Nonallopathic lesion of cervical region  4. Nonallopathic lesion-rib cage  Back  pain with thoracic, cervical and rib dysfunction. After verbal informed consent was obtained, OMT was performed including myofascial, HVLA, muscle energy, and counterstrain techniques. The patient experienced relief after this treatment and was counseled on using ice/heat and taking OTC NSAIDs/Tylenol as needed for soreness. Taught stretches and techniques to [place] on their own as well.   - OSTEOPATHIC MANIPULATIVE TX 3-4 BDY REGIONS        ______________________    I have spent 30 minutes in face to face time with this patient/patient proxy of which > 50% was in counseling or coordination of care regarding above issues/Dx.        1. The patient indicates understanding of these issues and agrees with the plan.  2.  The patient is given an After Visit Summary sheet that lists all of their medications with directions, their allergies, orders placed during this encounter, immunization dates, and follow- up instructions.  3. I reviewed the patient's medical information and medical history   4.  I reconciled the patient's medication list and prepared and supplied needed refills.  5.  I have reviewed the past medical, family, and social history sections including the medications and allergies listed in the above medical record  Bunnie Pion, DO, 04/21/2014, 1:59 PM

## 2014-08-12 ENCOUNTER — Other Ambulatory Visit (HOSPITAL_BASED_OUTPATIENT_CLINIC_OR_DEPARTMENT_OTHER): Payer: Self-pay

## 2014-08-12 MED ORDER — BUTALBITAL-ASPIRIN-CAFFEINE 50-325-40 MG PO CAPS
ORAL_CAPSULE | ORAL | Status: DC
Start: 2014-08-12 — End: 2015-07-27

## 2014-08-12 NOTE — Progress Notes (Signed)
[facility]    Person calling on behalf of patient: Patient (self)    May list multiple medications in this section    Medicine Name: butalbital-aspirin-caffeine [facility]) capsule    Dosage:     Frequency (how many pills, how many times a day):           [facility] - [place], [place]  Phone: [phone] Fax: [phone]

## 2014-08-12 NOTE — Progress Notes (Signed)
PER Patient (self), Priscilla Williams is a 44 year old female has requested a refill of Fiorinal     - Please review, Fiorinal has been marked Historical on 04/11/14    Please note: Fioricet was last prescribed on 05/25/13 but the patient is looking for Fiorinal.      Last Office Visit: 04/21/14  Last Physical Exam: 05/20/11      Other Med Adult:  Most Recent BP Reading(s)  04/21/14 : 126/84        CHOLESTEROL (mg/dl)   Date Value   01/19/2008 239*   ----------    LOW DENSITY LIPOPROTEIN DIRECT (mg/dl)   Date Value   01/19/2008 150*   ----------    HIGH DENSITY LIPOPROTEIN (mg/dl)   Date Value   01/19/2008 67   ----------  No results found for: TG        THYROID SCREEN TSH REFLEX FT4 (uIU/mL)   Date Value   01/19/2008 1.03   ----------      No results found for: TSH    No results found for: HGBA1C      No results found for: INR       Documented patient preferred pharmacies:    [facility] - [place], [place]  Phone: [phone] Fax: [phone]

## 2015-05-03 ENCOUNTER — Other Ambulatory Visit (HOSPITAL_BASED_OUTPATIENT_CLINIC_OR_DEPARTMENT_OTHER): Payer: Self-pay

## 2015-05-03 DIAGNOSIS — K580 Irritable bowel syndrome with diarrhea: Secondary | ICD-10-CM

## 2015-05-04 NOTE — Progress Notes (Signed)
PER Pharmacy, Priscilla Williams is a 44 year old female has requested a refill of Doxepin (Sinequan) 10 MG.      Last Office Visit: 04/21/14 with Bunnie Pion, DO   Last Physical Exam: 05/20/11      Other Med Adult:  Most Recent BP Reading(s)  04/21/14 : 126/84          CHOLESTEROL (mg/dl)   Date Value   01/19/2008 239 (H)   ----------    LOW DENSITY LIPOPROTEIN DIRECT (mg/dl)   Date Value   01/19/2008 150 (H)   ----------    HIGH DENSITY LIPOPROTEIN (mg/dl)   Date Value   01/19/2008 67   ----------  No results found for: TG        THYROID SCREEN TSH REFLEX FT4 (uIU/mL)   Date Value   01/19/2008 1.03   ----------      No results found for: TSH    No results found for: HGBA1C      No results found for: INR       Documented patient preferred pharmacies:        [facility] - [place], [place]  Phone: [phone] Fax: [phone]

## 2015-07-25 ENCOUNTER — Other Ambulatory Visit (HOSPITAL_BASED_OUTPATIENT_CLINIC_OR_DEPARTMENT_OTHER): Payer: Self-pay | Admitting: Family Medicine

## 2015-07-25 NOTE — Progress Notes (Signed)
PER Patient (self), Priscilla Williams is a 45 year old female has requested a refill of FIORINAL.      Last [facility] Office Visit: 04/21/2014  Last Physical Exam: 05/20/2011      Other Med Adult:  Most Recent BP Reading(s)  04/21/14 : 126/84          CHOLESTEROL (mg/dl)   Date Value   01/19/2008 239 (H)   ----------    LOW DENSITY LIPOPROTEIN DIRECT (mg/dl)   Date Value   01/19/2008 150 (H)   ----------    HIGH DENSITY LIPOPROTEIN (mg/dl)   Date Value   01/19/2008 67   ----------  No results found for: TG        THYROID SCREEN TSH REFLEX FT4 (uIU/mL)   Date Value   01/19/2008 1.03   ----------      No results found for: TSH    No results found for: HGBA1C      No results found for: INR       Documented patient preferred pharmacies:    [facility] - [place], [place]  Phone: [phone] Fax: [phone]

## 2015-07-26 ENCOUNTER — Telehealth (HOSPITAL_BASED_OUTPATIENT_CLINIC_OR_DEPARTMENT_OTHER): Payer: Self-pay | Admitting: Family Medicine

## 2015-07-26 NOTE — Progress Notes (Signed)
Returned call to pt who is upset that Dr. Floreen Comber would not fill her Fiorinal, and that an appointment was made for her in March without even speaking to her. She has been having migraines this week and used her last pill yesterday. I told her since she hasn't been seen in over a year, or met her PCP, she will want to evaluate her before prescribing. Pt said it's not her fault we keep changing her PCP, and she wouldn't be calling every six months for a refill "if she was a junkie." Offered her an appointment with team doctor on Monday evening, as she works two jobs and says it's impossible to get here. She reluctantly accepted and said she is going to most likely change offices after this because she is fed up. Routed to both providers for [facility]

## 2015-07-26 NOTE — Telephone Encounter (Signed)
-----   Message from Wetzel Bjornstad sent at 07/26/2015  5:33 PM EST -----  Regarding: Fiorinal RX  Good evening,    Sharene just called to inquire about her Fiorinal prescription. Apparently, an appointment was scheduled for her in March without her consent in order to get this medication. She's worried that she'll need the medication before then, so she would like someone to call her at [phone] to discuss her options.    Thanks!  Alcide Clever

## 2015-07-27 ENCOUNTER — Telehealth (HOSPITAL_BASED_OUTPATIENT_CLINIC_OR_DEPARTMENT_OTHER): Payer: Self-pay | Admitting: Family Medicine

## 2015-07-27 MED ORDER — BUTALBITAL-ASPIRIN-CAFFEINE 50-325-40 MG PO CAPS
ORAL_CAPSULE | ORAL | 0 refills | Status: DC
Start: 2015-07-27 — End: 2015-09-12

## 2015-07-27 NOTE — Progress Notes (Signed)
Lavonna Rua, MD, 07/27/2015, 5:42 PM  Called patient  Explained the reason why the script was not filled  Explained that I would be happy to refill her script for 1 week  I will leave this with the [place] for her

## 2015-07-31 ENCOUNTER — Ambulatory Visit (HOSPITAL_BASED_OUTPATIENT_CLINIC_OR_DEPARTMENT_OTHER): Payer: PRIVATE HEALTH INSURANCE | Admitting: Family Medicine

## 2015-07-31 VITALS — BP 130/86 | HR 79 | Temp 97.7°F

## 2015-07-31 DIAGNOSIS — Z13220 Encounter for screening for lipoid disorders: Secondary | ICD-10-CM

## 2015-07-31 DIAGNOSIS — E669 Obesity, unspecified: Secondary | ICD-10-CM

## 2015-07-31 DIAGNOSIS — G43709 Chronic migraine without aura, not intractable, without status migrainosus: Secondary | ICD-10-CM

## 2015-07-31 LAB — LOW DENSITY LIPOPROTEIN DIRECT: LOW DENSITY LIPOPROTEIN DIRECT: 125 mg/dL (ref 0–189)

## 2015-07-31 LAB — CHOLESTEROL: Cholesterol: 225 mg/dL (ref 0–239)

## 2015-07-31 LAB — HIGH DENSITY LIPOPROTEIN: HIGH DENSITY LIPOPROTEIN: 72 mg/dL (ref 40–?)

## 2015-07-31 LAB — TSH (THYROID STIMULATING HORMONE): TSH (THYROID STIM HORMONE): 1.29 u[IU]/mL (ref 0.358–3.740)

## 2015-07-31 MED ORDER — BUTALBITAL-APAP-CAFFEINE 50-325-40 MG PO TABS
1.0000 | ORAL_TABLET | Freq: Four times a day (QID) | ORAL | 0 refills | Status: DC | PRN
Start: 2015-07-31 — End: 2016-03-23

## 2015-07-31 NOTE — Progress Notes (Signed)
PRECEPTOR NOTE   I personally reviewed patient's history and exam findings with resident Dr. Mickey Farber .   I confirm the key elements of history and physical exam as described in resident's note.   I agree with the assessment and plan as described by resident.   Please see resident's note for further details.  44 y/o female with h/o migraines, not responsive to triptans , anxiety.  Fiornal worked well in past for many years-uses very intermittedly.  PMP shows last refilled about 9 months ago, no other medications on PMP.  Will get urine today to ensure no other concerns for abuse, give small amount of fiorcet (with tylenol instead of ASA given stomach upset with ASA component) enough to take 2 X/month over 6 months (#12 tabs). Discussion around triggers and healthy ways to manage stress. Will f/u with new PCP to [facility].    Neldon Mc, MD

## 2015-07-31 NOTE — Progress Notes (Signed)
Family Medicine Office Note    Subjective:  Migraines:  Had migraines since early teens  Usually concentrated one side  Triggers: Hormones, not enough sleep, unbalanced diet, skipped a few meals, heat   Can get photosensitivity sometimes  In the past used to get more severe migraines with blurry vision and vomiting - but has not had those in years  Usually tries coffee and aleve before tries fiorinal  Previously on sumatriptan but did not help - felt a burning sensation in her limbs.  Was on Fiorcet in the past which helped  Mother of 4 children, works 2 jobs, spouse has hx of cardiac surgery, landlord issues.  PMP checked and appropriate  Gets migraines 1-2 times a month  Migraines usually last 1 day  Saw neurologist for these when she was in her teens    Difficulty with weight loss:  Has family with thyroid problems - would like testing today    Screening cholesterol:  Strong family history of heart attacks    ROS: 10 point ROS was negative except for symptoms listed above    Objective:  EXAM:  BP 130/86  Pulse 79  Temp 97.7 F (36.5 C) (Temporal)  LMP 07/16/2015  SpO2 100%  GEN: Well appearing, NAD  CARD: S1 and S2 normal, no murmurs, clicks, gallops or rubs. Regular rate and rhythm.  CHEST: Chest is clear, no wheezing or rales. Normal symmetric air entry throughout both lung fields.  NEURO: Pupils equal, reactive to light, Extraocular movement intact; CN2-12 intact, normal strength in upper and lower extremity, normal gait  MOOD: Normal thought content, speech, affect, mood and dress are noted.    ASSESSMENT/PLAN:  Priscilla Williams is a 45 year old here for:    1. Chronic migraine without aura without status migrainosus, not intractable  Chronic migraines - symptoms over the years have been improving, less frequent migraines, no aura, no vision changes anymore, no vomiting.  Typically one sided headaches with photophobia.  PMP checked and appropriate.  Will obtain UDS.  Prescribed 12 tables of fioricet for  the next 6 months which pt agreed with plan.  - butalbital-acetaminophen-caffeine (FIORICET, ESGIC) per tablet; Take 1 tablet by mouth every 6 (six) hours as needed for Pain  Dispense: 12 tablet; Refill: 0  - Amphetamines Urine  - Benzodiazepines Urine  - Cocaine Metabolites Urine  - Ethanol Urine  - Opiates Urine  - Oxycodone Screen Urine  - Cannabinoids Urine  - Methadone Urine    2. Screening cholesterol level  Screening cholesterol - strong family history  - Cholesterol  - High Density Lipoprotein  - Low Density Lipoprotein, Direct    3. Obesity (BMI 30.0-39.9)  Did not speak in depth about diet and exercise and pt will follow up with her PCP to discuss further.  Will obtain TSH per pt request due to difficulty losing weight given checking cholesterol today.    - TSH (Thyroid Stimulating Hormone)    Chart routed to Dr. Michaela Williams for review    I have spent 15 minutes in face to face time with this patient/patient proxy of which > 50% was in counseling or coordination of care regarding above issues/Dx.      **AVS given and patient instructions reviewed with patient, who had the opportunity to ask questions. The pt verbalizes and demonstrated understanding. Advised to call/RTC with concerns.     [name],MD, 07/31/2015, 7:24 PM

## 2015-08-01 ENCOUNTER — Telehealth (HOSPITAL_BASED_OUTPATIENT_CLINIC_OR_DEPARTMENT_OTHER): Payer: Self-pay | Admitting: Family Medicine

## 2015-08-01 LAB — CANNABINOIDS URINE: CANNABINOIDS URINE: NEGATIVE

## 2015-08-01 LAB — METHADONE URINE: METHADONE URINE: NEGATIVE

## 2015-08-01 LAB — BENZODIAZEPINES URINE: BENZODIAZEPINES URINE: NEGATIVE

## 2015-08-01 LAB — AMPHETAMINES URINE: AMPHETAMINES URINE: NEGATIVE

## 2015-08-01 LAB — OXYCODONE SCREEN URINE
OXYCOD SCRN URINE: NEGATIVE
OXYCOD UR SPEC GRAV: 1.015 (ref 1.003–1.035)
OXYCOD URINE CREAT: 84 md/dL
OXYCOD URINE PH: 7 (ref 4.5–8.5)

## 2015-08-01 LAB — OPIATES URINE: OPIATES URINE: NEGATIVE

## 2015-08-01 LAB — ETHANOL URINE: ETHANOL URINE: NEGATIVE

## 2015-08-01 LAB — COCAINE METABOLITES URINE: COCAINE METABOLITES URINE: NEGATIVE

## 2015-08-01 NOTE — Progress Notes (Signed)
Results reviewed, please see telephone encounter for discussion with patient

## 2015-08-01 NOTE — Progress Notes (Signed)
Called pt about normal labs:  10 yr CVD risk of 0.6%  TSH normal    Priscilla Williams E. Devron Cohick,MD, 08/01/2015, 6:50 PM      Results for [name], [name] (MRN [id number]) as of 08/01/2015 18:49   Ref. Range 07/31/2015 20:00   TSH (THYROID STIM HORMONE) Latest Ref Range: 0.358 - 3.740 uIU/mL 1.290   Cholesterol Latest Ref Range: 0 - 239 mg/dL 225   HIGH DENSITY LIPOPROTEIN Latest Ref Range: 40- mg/dL 72   LOW DENSITY LIPOPROTEIN DIRECT Latest Ref Range: 0 - 189 mg/dL 125

## 2015-08-02 ENCOUNTER — Telehealth (HOSPITAL_BASED_OUTPATIENT_CLINIC_OR_DEPARTMENT_OTHER): Payer: Self-pay | Admitting: Ambulatory Care

## 2015-08-02 NOTE — Telephone Encounter (Signed)
-----   Message from Valentina Gu sent at 08/02/2015 11:39 AM EST -----  Regarding: Returning Call   Contact: [phone]  Hailey Stormer [phone], 45 year old, female    Calls today:  Clinical Questions ([place])    Name of person calling [place]   Specific nature of request Returning Call about Labs. Please Advise  Return phone number [phone]  Person calling on behalf of patient: Patient (self)      Patient's language of care: English    Patient does not need an interpreter.    Patient's PCP: Lavonna Rua, MD

## 2015-08-02 NOTE — Progress Notes (Signed)
Tried calling pt back but no answer. Left a message to call back. Results normal.  Kaelob Persky E. Tyyne Cliett,MD, 08/02/2015, 4:14 PM

## 2015-08-29 ENCOUNTER — Ambulatory Visit (HOSPITAL_BASED_OUTPATIENT_CLINIC_OR_DEPARTMENT_OTHER): Payer: Commercial Managed Care - HMO | Admitting: Family Medicine

## 2015-09-12 ENCOUNTER — Ambulatory Visit (HOSPITAL_BASED_OUTPATIENT_CLINIC_OR_DEPARTMENT_OTHER): Payer: PRIVATE HEALTH INSURANCE | Admitting: Family Medicine

## 2015-09-12 ENCOUNTER — Encounter (HOSPITAL_BASED_OUTPATIENT_CLINIC_OR_DEPARTMENT_OTHER): Payer: Self-pay | Admitting: Family Medicine

## 2015-09-12 VITALS — BP 124/80 | HR 77 | Temp 98.5°F | Wt 213.0 lb

## 2015-09-12 DIAGNOSIS — G43709 Chronic migraine without aura, not intractable, without status migrainosus: Secondary | ICD-10-CM

## 2015-09-12 DIAGNOSIS — M25561 Pain in right knee: Secondary | ICD-10-CM

## 2015-09-12 DIAGNOSIS — E669 Obesity, unspecified: Secondary | ICD-10-CM

## 2015-09-12 DIAGNOSIS — K580 Irritable bowel syndrome with diarrhea: Secondary | ICD-10-CM

## 2015-09-12 DIAGNOSIS — Z0000 Encounter for general adult medical examination without abnormal findings: Principal | ICD-10-CM

## 2015-09-12 NOTE — Patient Instructions (Signed)
A GENERAL GUIDE TO IMPROVING YOUR SLEEP  This handout outlines a simple, sensible, and highly effective approach for improving your sleep. By following these few rules, you should be able to get the sleep of your dreams!  1. Wake up and get out of bed at the same time every day INCLUDING WEEKENDS, whether you have a good or poor sleep on any particular night.  2. Go to bed only when you are sleepy, not when you think you need to be in bed.  Long periods of time in bed will lead to shallow, broken sleep. You should spend only the amount of time in bed that you actually need for sleep.   3. Get up when you can't sleep. When you are unable to sleep, get up and go to another room until you feel sleepy enough to fall asleep quickly before returning to bed. Get up again if sleep does not come on quickly.  4. Use the bed only for sleeping. Do not read, eat, watch TV, etc. in bed. Sex is the only exception to this guideline.  5. Avoid daytime napping. Napping, particularly in the later afternoon or early evening, may interfere with your night's sleep.  6. Create a buffer zone. The "buffer zone" is a quiet time prior to bed time. During this time, you should do things that are enjoyable on their own rather than activities that are taken as a means to an end.  7. Don't worry, plan, etc. in bed. If you are worrying, planning or can't shut off your thoughts, get up and stay up until you can return to bed without these mental activities interfering with your sleep.  Other helpful practices.  1. Turn the clock around.  2. Limit caffeine.  3. Limit alcohol and do not consume within 3 hours of bedtime.  4. Exercise regularly but not within 2 hours of bedtime.  5. Keep bedroom quiet, dark, and cool.  6. Do not consume a heavy meal close to bedtime. A light bedtime snack such as milk, peanut butter, or cheese is okay.  7. DO NOT TRY TOO HARD TO SLEEP. Just let sleep unfold.    Above all, be patient. Your sleep problem developed over  time so it will take some time to return to a more normal sleep pattern. By following the suggestions on this handout, you should see gradual sleep improvements.

## 2015-09-12 NOTE — Progress Notes (Signed)
Family Medicine Office Note    CC: Patient presents with:  Physical      HPI:  1. Here for a physical     Other concerns include    2. Knee pain - right  Also has knee pain on the right  Had a fracture of the patella left  Mar 3rd 2015  After a fall  Healed on its own, PT  Trochanteric Bursitis - Rxd with meds to reduce inflammation,   Walking hurts after a while  Climbs stairs    3. 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 Wishes that her skin be examined, lots of skin tags  Was told by her GYn that she needed a regular Skin exam    5. Headaches better - better with fioricet, once a month    6. IBS with diarrhea 45yo   Struggling with it  Sometime s has to avoid eating beacause she might have diarrhea  Doxepin helps 5m  Never been any kind of diet   Did not have BF today. For fear of diarrhea      Health Counseling:  Smoking: Reviewed and Discussed  Substance Use Issues: Reviewed and Discussed   Seat Belts: Reviewed and Discussed and wears it 98.9% of the time  Protective Sports Equipment: Reviewed and Discussed  Smoke Dectectors: Reviewed and Discussed  Diet, Exercise, Wt. Control: Reviewed and Discussed  Dental Health: Reviewed and Discussed, overdue  Vision Health: Reviewed and Discussed, not feeling   Mental Health: Reviewed and Discussed  Domestic Violence: Reviewed and Discussed  Sexual Health: Reviewed and Discussed  BSE: Reviewed and Discussed  Health Care Proxy: Reviewed and Discussed, Priscilla Williams - Priscilla Williams     Medication:    Current Outpatient Prescriptions on File Prior to Visit:  butalbital-acetaminophen-caffeine (FIORICET, ESGIC) per tablet Take 1 tablet by mouth every 6 (six) hours as needed for Pain Disp: 12 tablet Rfl: 0   doxepin (SINEQUAN) 10 MG capsule take 1 capsule by mouth once  daily Disp: 90 capsule Rfl: 3   norethindrone-ethinyl estradiol (LOESTRIN 1.5/30, 21,) 1.5-30 MG-MCG tablet Take 1 tablet by mouth daily. Disp:  Rfl:      No current facility-administered medications on file prior to visit.     Allergies:  Review of Patient's Allergies indicates:  No Known Allergies    Past Medical History:  Past Medical History:  No date: Anemia  No date: Headache  No date: IBS (irritable bowel syndrome)  03/22/2010: Nasal polyps  No date: Obesity    Past Surgical History:  Past Surgical History:  No date: NO SIGNIFICANT SURGICAL HISTORY    Family History:    Family History    Heart Father     Heart Brother     Stroke Father     Diabetes Maternal Grandmother     Cancer - Lung Mother     Cancer - Other Mother     Comment: bone    Cancer - Breast Maternal Grandmother     Comment: post menopausal    Cancer - Prostate Maternal Grandmother     Comment: post menopausal    Hypertension Mother     Hypertension Father  Lipids Father     Thyroid Sister     Comment: 2 sisters 1 with hypothyroid, one hyperthyroid       Social History:    Social History   Marital status: Married  Spouse name: N/A    Years of education: N/A  Number of children: N/A     Occupational History  None on file     Social History Main Topics   Smoking status: Never Smoker    Smokeless tobacco: Not on file    Alcohol use Yes    Comment: 1 drink every 6 months, wine    Drug use: No    Sexual activity: Yes    Partners: Male    Birth control/ protection: Pill     Other Topics Concern   None on file     Social History Narrative    Lives with Priscilla Williams and 4 children.  Work for city [employer], works in parking.  Used to Nurse, mental health.  Likes to spend time with family and friends, go to [employer], local youth organizations.     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  More pasta, carbs.  Hard to lose weight.  Work offers free [employer] - would like to find time.        3, 19, 17, 11 y Priscilla Williams, Priscilla Williams, Priscilla Williams)      [place] work           ROS: Pt denied complete ROS (HA, vision, hearing, swallowing changes, heat or cold intolerance, palpitations/CP, SOB,  pain with urination, skin lesion changes, numbness/tingling in arms or legs, vaginal discharge, )    EXAM:  BP 124/80   Pulse 77   Temp 98.5 F (36.9 C) (Temporal)   Wt 96.6 kg (213 lb)   LMP 08/21/2015   SpO2 98%   BMI 40.25 kg/m2  GEN: Well appearing, NAD  HEENT: PERLA, EOMI; TM intact without perforation or effusion, external canal normal. No significant cerumenosis noted;  Oral cavity, tongue, pharynx and palate have no inflammation or suspicious lesions.  NECK: The neck is supple and free of adenopathy or masses, the thyroid is normal without enlargement or nodules.  CARD: S1 and S2 normal, no murmurs, clicks, gallops or rubs. Regular rate and rhythm.  CHEST: Chest is clear, no wheezing or rales. Normal symmetric air entry throughout both lung fields.  ABD: The abdomen is soft without tenderness, guarding, mass, rebound or organomegaly. Bowel sounds are normal. No CVA tenderness or inguinal adenopathy noted.  MOOD: Normal thought content, speech, affect, mood and dress are noted.    Pts complete medical history (Problem List, PMH, PSH, SocHx, FamHx, Medications, and Allergies) was reviewed in Epic and updated as appropriate.     ASSESSMENT/PLAN:  Priscilla Williams is a 45 year old year old female who presents for a physical    (Z00.00) Routine general medical examination at a health care facility  (primary encounter diagnosis)  Diet - briefly dicussed elimination diet to help with symptoms of IBS  Ex - discussed walking, can help IBS symptoms, high BMI  Imms- UTD  Mammography UTD  PAP smear UTD  Diabetes: HbA1C ordered  Lipids -  recently done - WNL, reassurance    (K58.0) Irritable bowel syndrome with diarrhea  Comment: Discussed elimination diet today  Can continue on the RX she is on, continue discussion on how this is going next visit, written info given    (Z00.00) Health care maintenance  Comment As above  Plan: HEMOGLOBIN A1C           (M25.561) Acute pain of right knee  Comment: Exam - consistent with Hamstring strain, post knee WNL - unlikely to be Baker's cyst, no fullness, mild swelling superior and medial aspect of the patella - possible bursitis, Suggested - heat, NSAIDs, PT, No need for XRay - special test were all neg, no joint line tenderness, FU closely, RTC if worsening pain  Plan: REFERRAL TO PHYSICAL THERAPY ( INT)            (G43.709) Chronic migraine without aura without status migrainosus, not intractable  Comment: improved with Fioricet    (E66.9) Obesity (BMI 30.0-39.9)  Comment: Discussed diet, exercise, videos at home, PT to improve flexibility, mobility, continue MI next visit .  Plan: As above    DW Dr Antony Contras    Followup: 1 mo       Lavonna Rua, MD PGY2    1. The patient indicates understanding of these issues and agrees with the plan.  2.  The patient is given an After Visit Summary sheet that lists all of their medications with directions, their allergies, orders placed during this encounter, immunization dates, and follow- up instructions.  3. I reviewed the patient's medical information and medical history   4.  I reconciled the patient's medication list and prepared and supplied needed refills.  5.  I have reviewed the past medical, family, and social history sections including the medications and allergies listed in the above medical record    Electronically signed by: Lavonna Rua, MD, 09/12/2015 9:14 AM  This note is electronically signed in the electronic medical record.

## 2015-09-13 ENCOUNTER — Encounter (HOSPITAL_BASED_OUTPATIENT_CLINIC_OR_DEPARTMENT_OTHER): Payer: Self-pay | Admitting: Family Medicine

## 2015-09-13 LAB — HEMOGLOBIN A1C
ESTIMATED AVERAGE GLUCOSE: 97 (ref 74–160)
HEMOGLOBIN A1C: 5 % (ref 4.0–5.6)

## 2015-09-13 NOTE — Progress Notes (Signed)
Labs reviewed. Sent to patient via my chart. See My chart for comments

## 2015-09-17 DIAGNOSIS — M25561 Pain in right knee: Secondary | ICD-10-CM | POA: Insufficient documentation

## 2015-09-20 NOTE — Progress Notes (Signed)
Preceptor Note  I personally reviewed this case, along with the patient's history and exam findings, with Dr. Floreen Comber. I confirm the findings, and agree with the assessment and plan, as documented in the visit note.    Adonis Yim MD

## 2015-10-16 ENCOUNTER — Ambulatory Visit (HOSPITAL_BASED_OUTPATIENT_CLINIC_OR_DEPARTMENT_OTHER): Payer: PRIVATE HEALTH INSURANCE | Admitting: Family Medicine

## 2016-03-18 ENCOUNTER — Other Ambulatory Visit (HOSPITAL_BASED_OUTPATIENT_CLINIC_OR_DEPARTMENT_OTHER): Payer: Self-pay | Admitting: Family Medicine

## 2016-03-18 DIAGNOSIS — G43709 Chronic migraine without aura, not intractable, without status migrainosus: Secondary | ICD-10-CM

## 2016-03-18 NOTE — Progress Notes (Unsigned)
PER Patient (self), Priscilla Williams is a 45 year old female has requested a refill of FIORICET.      Last [facility] Office Visit: 09/12/2015  Last Physical Exam: 09/12/2015      Other Med Adult:  Most Recent BP Reading(s)  09/12/15 : 124/80          Cholesterol (mg/dL)   Date Value   07/31/2015 225   ----------    LOW DENSITY LIPOPROTEIN DIRECT (mg/dL)   Date Value   07/31/2015 125   ----------    HIGH DENSITY LIPOPROTEIN (mg/dL)   Date Value   07/31/2015 72   ----------  No results found for: TG        THYROID SCREEN TSH REFLEX FT4 (uIU/mL)   Date Value   01/19/2008 1.03   ----------        TSH (THYROID STIM HORMONE) (uIU/mL)   Date Value   07/31/2015 1.290   ----------      HEMOGLOBIN A1C (%)   Date Value   09/12/2015 5.0   ----------      No results found for: INR    No results found for: NA    No results found for: K        No results found for: CREAT      Documented patient preferred pharmacies:    [facility] [place] [place]  Phone: [phone] Fax: [phone]

## 2016-03-22 ENCOUNTER — Telehealth (HOSPITAL_BASED_OUTPATIENT_CLINIC_OR_DEPARTMENT_OTHER): Payer: Self-pay | Admitting: Ambulatory Care

## 2016-03-22 NOTE — Progress Notes (Signed)
Situation:RN Phone Triage:  RX issue  T/C returned to Priscilla Williams, 45 year old, female    Background:    "I've been getting Fioricet for my headaches for a while. I don't use them often but I need to have them if I need them I have used like 9 since February."  "I called this refill in on Monday and no one even had the courtesy to call me back to tell me it was refused."  No current headache     Assessment:   RX issue    Recommendation:   Advised that I would ask her PCP if willing to send a few before she is able to schedule since there are no available appts today. PCP or nursing will be in touch. Pt expresses understanding and is in agreement with plan of care.

## 2016-03-22 NOTE — Telephone Encounter (Signed)
-----   Message from Maisie Fus sent at 03/22/2016  9:23 AM EDT -----  Regarding: Fioricet  Contact: [phone]  Good morning,     Patient called very upset that her prescription for Fioricet was denied. She doesn't understand why she needs to come in to be seen for another prescription. She wants a doctor to call her back ASAP.     Thanks,   Colletta [place]

## 2016-03-23 ENCOUNTER — Telehealth (HOSPITAL_BASED_OUTPATIENT_CLINIC_OR_DEPARTMENT_OTHER): Payer: Self-pay | Admitting: Family Medicine

## 2016-03-23 ENCOUNTER — Other Ambulatory Visit (HOSPITAL_BASED_OUTPATIENT_CLINIC_OR_DEPARTMENT_OTHER): Payer: Self-pay | Admitting: Family Medicine

## 2016-03-23 DIAGNOSIS — G43709 Chronic migraine without aura, not intractable, without status migrainosus: Secondary | ICD-10-CM

## 2016-03-23 MED ORDER — BUTALBITAL-APAP-CAFFEINE 50-325-40 MG PO TABS: 1 | tablet | Freq: Four times a day (QID) | ORAL | 0 refills | 0 days | Status: AC | PRN

## 2016-03-23 MED ORDER — BUTALBITAL-APAP-CAFFEINE 50-325-40 MG PO TABS
1.0000 | ORAL_TABLET | Freq: Four times a day (QID) | ORAL | 0 refills | Status: AC | PRN
Start: 2016-03-23 — End: 2016-06-21

## 2016-03-23 NOTE — Progress Notes (Signed)
Called patient at aroun 4 pm yesterday  Documenting in retrospect  Lavonna Rua, MD, 03/23/2016, 8:21 AM    Unable to contact patient  Left VM  I had declined patient's script for fioricet earlier as I have seen the patient for over 6 months and  Have seen the patient only once before. Patient called back very upset  I discussed with Dr. Donne Hazel and called patient back. I was unable to reach her. Also noted that Tammy the RN has mentioned the lack of appointments for follow up.    Plan  To prescribe fioricet - 6 tabs for the next 3 months and suggest follow up appointment before the next refill. I emphasized in the message that fioricet can sometimes have side effects and best closely monitored by the PCP.     Lavonna Rua, MD, 03/23/2016, 8:27 AM

## 2016-05-22 ENCOUNTER — Ambulatory Visit (HOSPITAL_BASED_OUTPATIENT_CLINIC_OR_DEPARTMENT_OTHER): Payer: PRIVATE HEALTH INSURANCE | Admitting: Family Medicine

## 2016-05-22 ENCOUNTER — Encounter (HOSPITAL_BASED_OUTPATIENT_CLINIC_OR_DEPARTMENT_OTHER): Payer: Self-pay | Admitting: Family Medicine

## 2016-05-22 VITALS — BP 130/80 | HR 73 | Temp 97.8°F | Wt 207.0 lb

## 2016-05-22 DIAGNOSIS — K580 Irritable bowel syndrome with diarrhea: Secondary | ICD-10-CM

## 2016-05-22 DIAGNOSIS — G43709 Chronic migraine without aura, not intractable, without status migrainosus: Secondary | ICD-10-CM

## 2016-05-22 DIAGNOSIS — M25531 Pain in right wrist: Secondary | ICD-10-CM

## 2016-05-22 MED ORDER — DOXEPIN HCL 10 MG PO CAPS
10.0000 mg | ORAL_CAPSULE | Freq: Every day | ORAL | 3 refills | Status: DC
Start: 2016-05-22 — End: 2017-02-21

## 2016-05-22 MED ORDER — BUTALBITAL-APAP-CAFFEINE 50-325-40 MG PO TABS
1.0000 | ORAL_TABLET | Freq: Four times a day (QID) | ORAL | 0 refills | Status: DC | PRN
Start: 2016-05-22 — End: 2017-02-21

## 2016-05-22 MED ORDER — DOXEPIN HCL 10 MG PO CAPS: 10 mg | capsule | Freq: Every day | ORAL | 3 refills | 0 days | Status: DC

## 2016-05-22 MED ORDER — BUTALBITAL-APAP-CAFFEINE 50-325-40 MG PO TABS: 1 | tablet | Freq: Four times a day (QID) | ORAL | 0 refills | 0 days | Status: DC | PRN

## 2016-05-22 NOTE — Progress Notes (Signed)
[facility]  Office Visit Note   Patient presents with:  Carpal Tunnel Syndrome    Subjective:   Priscilla Williams is a 45 year old female patient who presents today for R wrist pain in a L handed person    1. R wrist pain  -started about 1 year ago  -getting worse  -pain radiates up arm  -no elbow pain  -numbness and weakness  -has been dropping phone  -no injury  -has not tried splinting  2. migraine  -takes fioricet rarely  -it works    She reports that she has never smoked. She does not have any smokeless tobacco history on file.    Objective:   BP 130/80  Pulse 73  Temp 97.8 F (36.6 C) (Temporal)  Wt 93.9 kg (207 lb)  SpO2 99%  BMI 39.11 kg/m2  General: alert and no distress  Eyes: Conjunctiva Clear  MS: R wrist with FROM;  +Tinnels and +Phelan ; handgrasp equal  R elbow with FROM; no TTP  Skin: skin color, texture, turgor are normal, No rashes or lesions  Psych: interactive and Normal Mood   Neuro: Alert and oriented X 3. Strength grossly intact. Normal coordination and gait.    Assessment & Plan:      Diagnosis:  1. Right wrist pain  (primary encounter diagnosis)  2. Chronic migraine without aura without status migrainosus, not intractable  3. Irritable bowel syndrome with diarrhea     Orders Placed This Encounter      REFERRAL TO ORTHOPEDICS ( INT)      butalbital-acetaminophen-caffeine (FIORICET, ESGIC) per tablet      doxepin (SINEQUAN) 10 MG capsule       R wrist pain that is getting worse over past several months; hx of exam consistent with carpal tunnel. Will refer to orthopedics.  She may try splinting at night.    1. The patient indicates understanding of these issues and agrees with the plan.  2. The patient is given an After Visit Summary sheet that lists all of their medications with directions, their allergies, orders placed during this encounter, immunization dates, and follow- up instructions.  3. I reviewed the patient's medical information and medical history   4. The patient  expressed understanding and no barriers to adherence were identified.  5. I have reviewed the past medical, family, and social history sections including the medications and allergies listed in the above medical record    Leighton Ruff, PA-C  05/22/2016

## 2016-05-22 NOTE — Patient Instructions (Signed)
For your Orthopedics referral - make your own appointment when it suits!  Due to high no-show rates, the [facility] department of orthopedics (which includes general orthopedics, ortho sports medicine, physiatry, rheumatology and EMG testing) ask that you make your own appointment by calling [phone] so they can schedule an appointment at a convenient time for you. You can also make appointments in person by stopping in at the Orthopedics Office in this building  ([address], [place]). Please make every effort to keep all your appointments, and if you need to cancel, please do so as soon as you realize you won't be able to make the appointment so other patients can use those times.

## 2016-05-23 ENCOUNTER — Telehealth (HOSPITAL_BASED_OUTPATIENT_CLINIC_OR_DEPARTMENT_OTHER): Payer: Self-pay | Admitting: Family Medicine

## 2016-05-23 NOTE — Progress Notes (Signed)
Ortho phone # left on pt's VM for appt schedule  [phone]

## 2016-05-29 ENCOUNTER — Ambulatory Visit

## 2016-06-05 ENCOUNTER — Telehealth (HOSPITAL_BASED_OUTPATIENT_CLINIC_OR_DEPARTMENT_OTHER): Payer: Self-pay | Admitting: Family Medicine

## 2016-06-05 NOTE — Progress Notes (Signed)
Ortho phone # left on pt's VM for appt schedule  [phone]

## 2016-06-11 ENCOUNTER — Telehealth (HOSPITAL_BASED_OUTPATIENT_CLINIC_OR_DEPARTMENT_OTHER): Payer: Self-pay | Admitting: Family Medicine

## 2016-06-11 NOTE — Progress Notes (Signed)
Ortho phone # left on pt's VM for appt schedule

## 2016-07-23 ENCOUNTER — Ambulatory Visit (HOSPITAL_BASED_OUTPATIENT_CLINIC_OR_DEPARTMENT_OTHER): Payer: PRIVATE HEALTH INSURANCE | Admitting: Medical

## 2016-07-23 ENCOUNTER — Encounter (HOSPITAL_BASED_OUTPATIENT_CLINIC_OR_DEPARTMENT_OTHER): Payer: Self-pay | Admitting: Medical

## 2016-07-23 VITALS — BP 138/82 | HR 82 | Temp 97.2°F | Wt 203.0 lb

## 2016-07-23 DIAGNOSIS — R202 Paresthesia of skin: Secondary | ICD-10-CM

## 2016-07-23 LAB — CBC, PLATELET & DIFFERENTIAL
ABSOLUTE BASO COUNT: 0 10*3/uL (ref 0.0–0.1)
ABSOLUTE EOSINOPHIL COUNT: 0.1 10*3/uL (ref 0.0–0.8)
ABSOLUTE IMM GRAN COUNT: 0.03 10*3/uL (ref 0.00–0.03)
ABSOLUTE LYMPH COUNT: 2.7 10*3/uL (ref 0.6–5.9)
ABSOLUTE MONO COUNT: 0.7 10*3/uL (ref 0.2–1.4)
ABSOLUTE NEUTROPHIL COUNT: 7.8 10*3/uL (ref 1.6–8.3)
BASOPHIL %: 0.2 % (ref 0.0–1.2)
EOSINOPHIL %: 0.6 % (ref 0.0–7.0)
HEMATOCRIT: 43.1 % (ref 34.1–44.9)
HEMOGLOBIN: 13.8 g/dL (ref 11.2–15.7)
IMMATURE GRANULOCYTE %: 0.3 % (ref 0.0–0.4)
LYMPHOCYTE %: 23.8 % (ref 15.0–54.0)
MEAN CORP HGB CONC: 32 g/dL (ref 31.0–37.0)
MEAN CORPUSCULAR HGB: 27.8 pg (ref 26.0–34.0)
MEAN CORPUSCULAR VOL: 86.7 fL (ref 80.0–100.0)
MEAN PLATELET VOLUME: 11.3 fL (ref 8.7–12.5)
MONOCYTE %: 6.1 % (ref 4.0–13.0)
NEUTROPHIL %: 69 % (ref 40.0–75.0)
PLATELET COUNT: 312 10*3/uL (ref 150–400)
RBC DISTRIBUTION WIDTH STD DEV: 42.7 fL (ref 35.1–46.3)
RBC DISTRIBUTION WIDTH: 13.5 % (ref 11.5–14.3)
RED BLOOD CELL COUNT: 4.97 M/uL (ref 3.90–5.20)
WHITE BLOOD CELL COUNT: 11.2 10*3/uL — ABNORMAL HIGH (ref 4.0–11.0)

## 2016-07-23 LAB — RBC SEDIMENTATION RATE: RBC SEDIMENTATION RATE: 10 MM/HR (ref 0–20)

## 2016-07-23 NOTE — Progress Notes (Signed)
SUBJECTIVE  Priscilla Williams is a 46 year old female with:  Patient Active Problem List:     Migraine     Heartburn     Irritable bowel syndrome     Obesity (BMI 30.0-39.9)     Nasal polyps     Trochanteric bursitis     Acute pain of right knee      Docia Furl Mullarkey's main goal for today's visit is to address follow-up wrist pain.     #) F/u wrist pain and paresthesias  - Thought to be due to carpal tunnel  - Saw ortho and had EMG that was completely normal  - X-ray of neck showed some degenerative changes of cervical spine  - Originally had numbness/tingling in R hand/arm  - Intermittent x one year  - Now with numbness/tingling spreading to left hand and both feet  - Right hand now constantly numb  - Causing problems with fine motor  - Drops things like silverware and phone  - Hands and feet always feel cold  - Denies trauma/fall  - Denies increased stress  - Denies tick bite    OBJECTIVE    BP 138/82  Pulse 82  Temp 97.2 F (36.2 C) (Temporal)  Wt 92.1 kg (203 lb)  SpO2 99%  BMI 38.36 kg/m2  Pain Score: Data Unavailable      Most Recent BP Reading(s)  07/23/16 : 138/82  05/22/16 : 130/80  09/12/15 : 124/80    Most Recent Weight Reading(s)  07/23/16 : 92.1 kg (203 lb)  05/22/16 : 93.9 kg (207 lb)  09/12/15 : 96.6 kg (213 lb)      Physical Exam  General Appearance: Well nourished, well developed female in no apparent distress.  Sitting comfortably in exam room.  Alert and oriented to examiner.  Head/Eyes/Ears/Nose/Throat:  Normocephalic/atraumatic.  Pupils are equal and round, extra-ocular movements are intact.   Lung: Clear to auscultation bilaterally, no crackles/wheezes/rhonchi  Cardiovascular: Regular rate and rhythm, no murmurs/rubs/gallops   Neuro: CN II-XII grossly intact. Decreased sensation to light touch in both hands. Sharp/dull sensation intact. Sensation to light touch grossly normal. Strength 5/5 in UE/LE.      ASSESSMENT AND PLAN:  Priscilla Williams is a 46 year old female with:    (R20.2) Paresthesias   (primary encounter diagnosis)  Comment: With one year of paresthesias in right hand that were thought to be 2/2 carpal tunnel but with completely normal EMG. Paresthesias are now spreading to left hand/arm and both feet. Affecting fine motor function and QOL. Unclear etiology. No neck/back pain to suggest radiculopathy. No h/o DM. No tick bite to suggest lyme disease. Will begin with blood work and refer to neurology  Plan:   - CBC + PLT + AUTO DIFF,   - COMPREHENSIVE METABOLIC PANEL,   - THYROID SCREEN TSH REFLEX FT4,   - HIV ANTIGEN ANTIBODY,   - TREPONEMA PALLIDUM AB IGG,   - VITAMIN B12, FOLATE,   - VITAMIN D,25 HYDROXY,   - HEMOGLOBIN A1C,   - RBC SEDIMENTATION RATE,    - RHEUMATOID FACTOR,   - REFERRAL TO NEUROLOGY ( INT)            1. The patient indicates understanding of these issues and agrees with the plan.  2.  The patient is given an After Visit Summary sheet that lists all of their medications with directions, their allergies, orders placed during this encounter, immunization dates, and follow- up instructions.  3. I reviewed  the patient's medical information and medical history   4. The patient expressed understanding and no barriers to adherence were identified.  5.  I have reviewed the past medical, family, and social history sections including the medications and allergies listed in the above medical record      Tamarius Rosenfield S. Aleasha Fregeau, PA-C, 07/23/2016, 6:43 PM

## 2016-07-24 LAB — COMPREHENSIVE METABOLIC PANEL
ALANINE AMINOTRANSFERASE: 13 U/L (ref 12–45)
ALBUMIN: 4.1 g/dL (ref 3.4–5.0)
ALKALINE PHOSPHATASE: 107 U/L (ref 45–117)
ANION GAP: 7 mmol/L (ref 5–15)
ASPARTATE AMINOTRANSFERASE: 9 U/L (ref 8–34)
BILIRUBIN TOTAL: 0.4 mg/dL (ref 0.2–1.0)
BUN (UREA NITROGEN): 7 mg/dL (ref 7–18)
CALCIUM: 9.4 mg/dL (ref 8.5–10.1)
CARBON DIOXIDE: 30 mmol/L (ref 21–32)
CHLORIDE: 104 mmol/L (ref 98–107)
CREATININE: 0.6 mg/dL (ref 0.4–1.2)
ESTIMATED GLOMERULAR FILT RATE: >60 mL/min (ref >60)
Glucose Random: 84 mg/dL (ref 74–160)
POTASSIUM: 4.5 mmol/L (ref 3.5–5.1)
SODIUM: 141 mmol/L (ref 136–145)
TOTAL PROTEIN: 7.5 g/dL (ref 6.4–8.2)

## 2016-07-24 LAB — HEMOGLOBIN A1C
ESTIMATED AVERAGE GLUCOSE: 103 (ref 74–160)
HEMOGLOBIN A1C: 5.2 % (ref 4.0–5.6)

## 2016-07-24 LAB — VITAMIN B12: VITAMIN B12: 211 pg/mL (ref 193–986)

## 2016-07-24 LAB — RHEUMATOID FACTOR: RHEUMATOID FACTOR: NEGATIVE

## 2016-07-24 LAB — TREPONEMA PALLIDUM AB IGG: TREPONEMA PALLIDUM AB IgG: NONREACTIVE

## 2016-07-24 LAB — HIV ANTIGEN ANTIBODY: HIV 1/2 PLUS O AG/AB SCREEN: NONREACTIVE

## 2016-07-24 LAB — THYROID SCREEN TSH REFLEX FT4: THYROID SCREEN TSH REFLEX FT4: 2.25 u[IU]/mL (ref 0.358–3.740)

## 2016-07-24 LAB — VITAMIN D,25 HYDROXY: VITAMIN D,25 HYDROXY: 6 ng/mL — CL (ref 30.0–100.0)

## 2016-07-24 LAB — FOLATE: FOLATE: 13.2 ng/mL (ref 3.0–?)

## 2016-07-25 ENCOUNTER — Encounter (HOSPITAL_BASED_OUTPATIENT_CLINIC_OR_DEPARTMENT_OTHER): Payer: Self-pay | Admitting: Medical

## 2016-07-26 ENCOUNTER — Other Ambulatory Visit (HOSPITAL_BASED_OUTPATIENT_CLINIC_OR_DEPARTMENT_OTHER): Payer: Self-pay | Admitting: Family Medicine

## 2016-07-26 DIAGNOSIS — M79601 Pain in right arm: Secondary | ICD-10-CM

## 2016-07-26 DIAGNOSIS — M79602 Pain in left arm: Principal | ICD-10-CM

## 2016-07-26 DIAGNOSIS — R202 Paresthesia of skin: Principal | ICD-10-CM

## 2016-07-26 MED ORDER — CYANOCOBALAMIN 500 MCG PO TABS: 500 ug | tablet | Freq: Every day | ORAL | 1 refills | 0 days | Status: DC

## 2016-07-26 MED ORDER — CYANOCOBALAMIN 500 MCG PO TABS
500.0000 ug | ORAL_TABLET | Freq: Every day | ORAL | 1 refills | Status: DC
Start: 2016-07-26 — End: 2016-07-31

## 2016-07-26 MED ORDER — ERGOCALCIFEROL 1.25 MG (50000 UT) PO CAPS
50000.0000 [IU] | ORAL_CAPSULE | ORAL | 0 refills | Status: DC
Start: 2016-07-26 — End: 2016-07-31

## 2016-07-26 MED ORDER — ERGOCALCIFEROL 50000 UNITS PO CAPS: 50000 [IU] | capsule | ORAL | 0 refills | 0 days | Status: DC

## 2016-07-29 ENCOUNTER — Other Ambulatory Visit (HOSPITAL_BASED_OUTPATIENT_CLINIC_OR_DEPARTMENT_OTHER): Payer: Self-pay | Admitting: Ambulatory Care

## 2016-07-29 ENCOUNTER — Encounter (HOSPITAL_BASED_OUTPATIENT_CLINIC_OR_DEPARTMENT_OTHER): Payer: Self-pay | Admitting: Medical

## 2016-07-29 NOTE — Progress Notes (Signed)
Pharmacy is now changed to [facility] in [place], [place], will have provider re-approve rx's.

## 2016-07-29 NOTE — Telephone Encounter (Signed)
-----   Message from Rodena Piety sent at 07/29/2016 12:07 PM EST -----  Regarding: would like prescription to be sent to diff pharmacy  Contact: [place] [phone], 46 year old, female    Calls today:  Clinical Questions ([place])    Patient states she does not use [facility] in [place]. Would like scripts to be sent to [facility] on [address] in [place]. Would like call to be notified when scripts are sent to [facility].   Return phone number [phone]  Person calling on behalf of patient: Patient (self)        Patient's language of care: English    Patient does not need an interpreter.    Patient's PCP: Lavonna Rua, MD

## 2016-07-31 ENCOUNTER — Encounter (HOSPITAL_BASED_OUTPATIENT_CLINIC_OR_DEPARTMENT_OTHER): Payer: Self-pay | Admitting: Family Medicine

## 2016-07-31 ENCOUNTER — Other Ambulatory Visit (HOSPITAL_BASED_OUTPATIENT_CLINIC_OR_DEPARTMENT_OTHER): Payer: Self-pay | Admitting: Family Medicine

## 2016-07-31 MED ORDER — CYANOCOBALAMIN 500 MCG PO TABS
500.0000 ug | ORAL_TABLET | Freq: Every day | ORAL | 1 refills | Status: AC
Start: 2016-07-31 — End: 2016-08-30

## 2016-07-31 MED ORDER — ERGOCALCIFEROL 50000 UNITS PO CAPS: 50000 [IU] | capsule | ORAL | 0 refills | 0 days | Status: DC

## 2016-07-31 MED ORDER — ERGOCALCIFEROL 1.25 MG (50000 UT) PO CAPS
50000.0000 [IU] | ORAL_CAPSULE | ORAL | 0 refills | Status: DC
Start: 2016-07-31 — End: 2016-09-11

## 2016-07-31 MED ORDER — CYANOCOBALAMIN 500 MCG PO TABS: 500 ug | tablet | Freq: Every day | ORAL | 1 refills | 0 days | Status: AC

## 2016-07-31 NOTE — Telephone Encounter (Signed)
Request already sent to provider as a high priority.

## 2016-07-31 NOTE — Telephone Encounter (Signed)
Message from [place]:  [place]. [place] would like a refill of the following medications:  cyanocobalamin 500 MCG tablet Lavonna Rua, MD]    Preferred pharmacy: [facility] [id number] - [place], [place]:

## 2016-07-31 NOTE — Progress Notes (Signed)
Hello,    Pharmacy has not received prescriptions for these medications. Documented in epic as "normal". Please electronically send to patient's preferred pharmacy. She states that she has been trying to get these medications since Saturday.    Thank you!

## 2016-08-01 MED ORDER — ERGOCALCIFEROL 50000 UNITS PO CAPS: 50000 [IU] | capsule | ORAL | 0 refills | 0 days | Status: AC

## 2016-08-01 MED ORDER — ERGOCALCIFEROL 1.25 MG (50000 UT) PO CAPS
50000.0000 [IU] | ORAL_CAPSULE | ORAL | 0 refills | Status: DC
Start: 2016-08-01 — End: 2018-04-28

## 2016-08-01 MED ORDER — CYANOCOBALAMIN 500 MCG PO TABS: 500 ug | tablet | Freq: Every day | ORAL | 1 refills | 0 days | Status: AC

## 2016-08-01 MED ORDER — CYANOCOBALAMIN 500 MCG PO TABS
500.0000 ug | ORAL_TABLET | Freq: Every day | ORAL | 1 refills | Status: AC
Start: 2016-08-01 — End: 2016-08-31

## 2016-08-12 ENCOUNTER — Encounter (HOSPITAL_BASED_OUTPATIENT_CLINIC_OR_DEPARTMENT_OTHER): Payer: Self-pay | Admitting: Family Medicine

## 2016-09-11 ENCOUNTER — Ambulatory Visit (HOSPITAL_BASED_OUTPATIENT_CLINIC_OR_DEPARTMENT_OTHER): Payer: PRIVATE HEALTH INSURANCE | Admitting: Neurology

## 2016-09-11 ENCOUNTER — Encounter (HOSPITAL_BASED_OUTPATIENT_CLINIC_OR_DEPARTMENT_OTHER): Payer: Self-pay | Admitting: Family Medicine

## 2016-09-11 VITALS — BP 155/97 | Temp 98.4°F

## 2016-09-11 DIAGNOSIS — G5623 Lesion of ulnar nerve, bilateral upper limbs: Secondary | ICD-10-CM

## 2016-09-11 DIAGNOSIS — G5611 Other lesions of median nerve, right upper limb: Secondary | ICD-10-CM

## 2016-09-11 MED ORDER — IBUPROFEN 600 MG PO TABS: 600 mg | tablet | Freq: Two times a day (BID) | ORAL | 1 refills | 0 days | Status: DC | PRN

## 2016-09-11 MED ORDER — IBUPROFEN 600 MG PO TABS
600.0000 mg | ORAL_TABLET | Freq: Two times a day (BID) | ORAL | 1 refills | Status: DC | PRN
Start: 2016-09-11 — End: 2016-12-27

## 2016-09-11 NOTE — Telephone Encounter (Signed)
Message from [place]:  [place]. [place] would like a refill of the following medications:     ergocalciferol (VITAMIN D2) 50000 UNIT capsule Jeananne Rama, MD]    Preferred pharmacy: [facility] [id number] - [place], [place]:

## 2016-09-11 NOTE — Progress Notes (Signed)
Here for evaluation of issues with arm and hand per PCP.

## 2016-09-11 NOTE — Telephone Encounter (Signed)
Refill too soon

## 2016-09-11 NOTE — Progress Notes (Signed)
Neurology Consultation    Priscilla Williams is a 46 year old female, referred for consultation by Lavonna Rua, MD for evaluation of hand paresthesia.     HPI: Priscilla Williams is a 46 year old left-handed female with past medical history of migraine and irritable bowel syndrome who presents with paresthesia and weakness of the hands. Her symptoms started a year ago. She developed progressive numbness and weakness in the right hand. She saw outside orthopedics and had an EMG in 05/2016, which was reportedly normal. However, her symptoms have been worsening. She feels numb in all right fingers up to the elbow. She drops things. In January 2018, she started having numbness in the left hand involving the medial 3 fingers. No significant numbness/tingling in the feet. No significant neck pain.    She has had recent lab work, which shows a very low vitamin D level of 6, for which she has been taking D2 50000 iu per week. Her vitamin B12 was low normal at 211. She has been taking B12 500 mcg daily.   She denies having abnormal sensation in the face. No change of bowel function but she reports urinary frequency especially at night. She has been losing weight due to diet modification.     Past medical history:  Patient Active Problem List:     Migraine     Heartburn     Irritable bowel syndrome     Obesity (BMI 30.0-39.9)     Nasal polyps     Trochanteric bursitis     Acute pain of right knee        Current Outpatient Prescriptions:  Cyanocobalamin (VITAMIN B 12 PO) Take by mouth Disp:  Rfl:    ergocalciferol (VITAMIN D2) 50000 UNIT capsule Take 1 capsule by mouth once a week Disp: 12 capsule Rfl: 0   butalbital-acetaminophen-caffeine (FIORICET, ESGIC) per tablet Take 1 tablet by mouth every 6 (six) hours as needed for Pain Disp: 12 tablet Rfl: 0   doxepin (SINEQUAN) 10 MG capsule Take 1 capsule by mouth daily Disp: 90 capsule Rfl: 3   norethindrone-ethinyl estradiol (LOESTRIN 1.5/30, 21,) 1.5-30 MG-MCG tablet  Take 1 tablet by mouth daily. Disp:  Rfl:      No current facility-administered medications for this visit.     Review of Patient's Allergies indicates:  No Known Allergies      Family History    Heart Father     Heart Brother     Stroke Father     Diabetes Maternal Grandmother     Cancer - Lung Mother     Cancer - Other Mother     Comment: bone    Cancer - Breast Maternal Grandmother     Comment: post menopausal    Cancer - Prostate Maternal Grandmother     Comment: post menopausal    Hypertension Mother     Hypertension Father     Lipids Father     Thyroid Sister     Comment: 2 sisters 1 with hypothyroid, one hyperthyroid       Social History:  reports that she has never smoked. She does not have any smokeless tobacco history on file. She reports that she drinks alcohol. She reports that she does not use drugs. She drinks alcohol on occasion.     ROS: a review of systems was conducted and was negative for constitutional, HEENT, skin, cardiovascular, respiratory, gastrointestinal, genitourinary, musculoskeletal, neurological, psychiatric, endocrinologic, or hematologic/lymphatic symptoms except for what have been described  in HPI.     PHYSICAL EXAM: BP (!) 155/97  Temp 98.4 F (36.9 C) (Oral)  SpO2 98%  GEN: NAD. Interactive. Well appearing. Normal affect.    Skin: No rashes or bruising.  HEENT: NC/AT, sclera/conjunctiva WNL, MMM, no oral lesions.   Neck: Supple. No bruits.   CV:  RRR. Nl S1/S2.   Extr:  Warm and well perfused.   Neuro:   MENTAL STATUS: The patient was fully alert and oriented, and was following all commands and appropriately interactive. There was complete fluency without paraphasic errors. The concentration, attention and memory were intact.  CN II-XII:  Optic discs showed no papilledema. Visual fields were full to confrontation. PERRL. There was no ptosis and the EOM were intact without nystagmus or saccadic breakdown. Light-touch sensation on face was normal bilaterally. There was no facial  asymmetry. The tongue and palate were midline with protrusion and elevation, respectively. There was no dysarthria. The hearing was grossly normal to finger rub bilaterally. Shoulder shrugs and head turns were normal.  MOTOR: The bulk and tone were normal. There was no cogwheel rigidity, bradykinesia, pronator drift, fasciculations, myoclonus or tremor. The strength was 5/5 throughout including: shoulder abduction, flexion and extension at the elbows, fingers, hips, knees, as well as ankle dorsiflexion and plantarflexion. No hand muscle atrophy. No weakness APB, ADM, or FDI. Tinel test positive at both elbows.   SENSATION: Decreased to pinprick right 1-5 fingers.   REFLEXES: The deep tendon reflexes were 2+ and symmetric at the triceps, biceps, brachioradialis, quadriceps and gastrocnemius/soleus. The toes were downgoing bilaterally.  CEREBELLAR: The finger-to-nose movements were normal. There was no truncal ataxia.  GAIT/STANCE: The stance and gait were normal.    LABS AND IMAGING REVIEWED:   A1c 5.2.   TSH normal.   B12 211.  Treponema pallildum IgG negative.   HIV negative.      ASSESSMENT AND PLAN:    Priscilla Williams is a 46 year old left-handed female with past medical history of migraine and irritable bowel syndrome who presents with complaints of numbness and weakness in the right>left hand. On exam, she has decreased pinprick sensation in all the fingers on the right side. She has positive Tinel test at bilateral elbows. No hand muscle atrophy. Strength intact. Reflexes normal and symmetric.   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 Given that her symptoms are mild and there is no weakness on exam, I have recommended conservative treatment with elbow splinting nightly. Avoid putting pressure to the elbow and arm movements that aggravate her symptoms. I have mentioned occupational therapy however the patient states that she is very  busy with 2 jobs and perhaps does not have time to participate in therapy. She would try elbow splinting first and let me know if her symptoms do not improve.   Plan to see her in 2 months for follow-up.     Thank you for allowing me to participate in the care of your patient.      Sincerely,         ?Leslie Dales, MD

## 2016-09-16 ENCOUNTER — Encounter (HOSPITAL_BASED_OUTPATIENT_CLINIC_OR_DEPARTMENT_OTHER): Payer: Self-pay | Admitting: Family Medicine

## 2016-09-16 NOTE — Telephone Encounter (Signed)
Med on file with Pharmacy:  Butch Penny at [facility] confirmed that vitamin D 50000 units has 1 active refills remaining. No refill is required at this time.

## 2016-09-16 NOTE — Telephone Encounter (Signed)
Message from [place]:  Priscilla Williams. [place] would like a refill of the following medications:     ergocalciferol (VITAMIN D2) 50000 UNIT capsule Jeananne Rama, MD]    Preferred pharmacy: [facility] [id number] - [place], [place]:

## 2016-11-13 ENCOUNTER — Ambulatory Visit (HOSPITAL_BASED_OUTPATIENT_CLINIC_OR_DEPARTMENT_OTHER): Payer: PRIVATE HEALTH INSURANCE | Admitting: Neurology

## 2016-12-27 ENCOUNTER — Ambulatory Visit (HOSPITAL_BASED_OUTPATIENT_CLINIC_OR_DEPARTMENT_OTHER): Payer: PRIVATE HEALTH INSURANCE | Admitting: Medical

## 2016-12-27 ENCOUNTER — Encounter (HOSPITAL_BASED_OUTPATIENT_CLINIC_OR_DEPARTMENT_OTHER): Payer: Self-pay | Admitting: Medical

## 2016-12-27 VITALS — BP 138/84 | HR 98 | Temp 96.6°F | Wt 207.8 lb

## 2016-12-27 DIAGNOSIS — R202 Paresthesia of skin: Secondary | ICD-10-CM

## 2016-12-27 MED ORDER — IBUPROFEN 600 MG PO TABS: 600 mg | tablet | Freq: Two times a day (BID) | ORAL | 1 refills | 0 days | Status: DC | PRN

## 2016-12-27 MED ORDER — IBUPROFEN 600 MG PO TABS
600.0000 mg | ORAL_TABLET | Freq: Two times a day (BID) | ORAL | 1 refills | Status: DC | PRN
Start: 2016-12-27 — End: 2017-02-21

## 2016-12-27 NOTE — Progress Notes (Addendum)
SUBJECTIVE:  Priscilla Williams is a 46 year old female with the following active medical problems:  Patient Active Problem List:     Migraine     Heartburn     Irritable bowel syndrome     Obesity (BMI 30.0-39.9)     Nasal polyps     Trochanteric bursitis     Acute pain of right knee    CC: Priscilla Williams is a 46 year old female who presents for follow-up of     1) Numbness and tingling of hands and feet   -began one year ago  -started with tingling of ulnar aspect of right hand and eventually entire right hand  -next spread to ulnar aspect of left hand   -has now spread to both legs, beginning with the feet and occasionally radiating up to the knee   -recently has been experiencing decreased dexterity of fingers of both hands, with several instances of dropping things and cutting herself while using a knife   -was originally seen by ortho who ruled out carpal or cubital tunnel, negative EMG study   -then seen by our clinic in January 2018 for full panel of labs including RH, ESR, A1C, Vitamin D and B12, HIV, TSH  -all labs normal, except low Vitamin D for which patient took supplement for 8 weeks   -patient was then seen by neuro, who 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 and recommended nightly arm splint with towel and possible OT  -patient found the arm splint to be uncomfortable and got no relief, so discontinued   -would prefer not to start OT until etiology of symptoms are known   -taking ibuprofen 635m PRN at night with minimal relief   -denies headaches, dizziness, chest pain, nausea, vomiting, shortness of breath       OBJECTIVE:   BP 138/84 (Site: LA, Position: Sitting, Cuff Size: Lrg)  Pulse 98  Temp 96.6 F (35.9 C) (Temporal)  Wt 94.3 kg (207 lb 12.8 oz)  LMP   SpO2 98%  BMI 39.26 kg/m2      Physical Exam   Constitutional: She is oriented to person, place, and time. She appears well-developed and well-nourished.   Cardiovascular: Normal rate, regular rhythm and normal  heart sounds.    Pulmonary/Chest: Breath sounds normal. No respiratory distress. She has no wheezes.   Neurological: She is alert and oriented to person, place, and time.   Gross sensation and strength intact in all four extremities    Skin: Skin is warm and dry.   Psychiatric: She has a normal mood and affect. Her behavior is normal. Judgment and thought content normal.     (R20.2) Paresthesias  (primary encounter diagnosis)  Comment: One year of numbness and tingling, progressing from right hand to left hand to both lower extremities. Unclear etiology, but ordering lyme test to complete comprehensive lab workup. Patient amenable [facility]. Will consider OT if neurology can provide likely etiology for her symptoms. Plan to follow up in clinic in 5-6 weeks after neuro appointment or sooner with any new or worsening symptoms.   Plan:   -LYME IGG/IGM RFLX WESTERN BLOT, REFERRAL TO         NEUROLOGY (EXT), ibuprofen (ADVIL,MOTRIN) 600         MG tablet  -[facility]erral to [facility]   -follow up 5-6 weeks after neuro appointment

## 2016-12-27 NOTE — Progress Notes (Signed)
SUBJECTIVE  Priscilla Williams is a 46 year old female with:  Patient Active Problem List:     Migraine     Heartburn     Irritable bowel syndrome     Obesity (BMI 30.0-39.9)     Nasal polyps     Trochanteric bursitis     Acute pain of right knee      [name] main goal for today's visit is to address parsthesias    #) F/u paresthesias  - x one year   -Started with tingling of right hand and eventually entire right hand  -Next spread to left hand and has now spread to both legs, beginning with the feet and occasionally radiating up to the knee   -Continues to have decreased dexterity of fingers of both hands, with several instances of dropping things and cutting herself while using a knife   - Saw ortho, thought carpal tunnel  - Had EMG which was normal  -At last visit had full panel of labs including RH, ESR, A1C, Vitamin D and B12, HIV, TSH  -All labs normal, except low Vitamin D for which patient took supplement for 8 weeks   -Saw neuro 4 months ago who 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 and recommended nightly arm splint with towel and possible OT  - Found the arm splint to be uncomfortable and got no relief, so discontinued   - Did not have a good experience with neuro, does not wish to return to [facility] neurology  -Would prefer not to start OT until etiology of symptoms are known   -Taking ibuprofen 636m PRN at night with minimal relief   - Denies headaches, dizziness, chest pain, nausea, vomiting, shortness of breath       OBJECTIVE    BP 138/84 (Site: LA, Position: Sitting, Cuff Size: Lrg)  Pulse 98  Temp 96.6 F (35.9 C) (Temporal)  Wt 94.3 kg (207 lb 12.8 oz)  LMP   SpO2 98%  BMI 39.26 kg/m2  Pain Score: Data Unavailable      Most Recent BP Reading(s)  12/27/16 : 138/84  09/11/16 : (!) 155/97  07/23/16 : 138/82    Most Recent Weight Reading(s)  12/27/16 : 94.3 kg (207 lb 12.8 oz)  07/23/16 : 92.1 kg (203 lb)  05/22/16 : 93.9 kg (207 lb)      Physical Exam  General  Appearance: Well nourished, well developed female in no apparent distress.  Sitting comfortably in exam room.  Alert and oriented to examiner.  Head/Eyes/Ears/Nose/Throat:  Normocephalic/atraumatic.    Lung: Effort normal  Skin: skin warm and dry  Neuro: sensation grossly intact bilaterally  Psychiatric: normal mood and affect    ASSESSMENT AND PLAN:  LDESSIREE SZEis a 46year old female with:    (R20.2) Paresthesias  (primary encounter diagnosis)  Comment: One year of numbness and tingling, progressing from right hand to left hand to both lower extremities. Unclear etiology, but ordering lyme test to complete comprehensive lab workup. Patient amenable to referral [facility]y through [facility] and [facility]. Will consider OT if neurology can provide likely etiology for her symptoms. Plan to follow up in clinic in 5-6 weeks after neuro appointment or sooner with any new or worsening symptoms.   Plan:   Plan:   - LYME IGG/IGM RFLX WESTERN BLOT,   - REFERRAL TO NEUROLOGY (EXT),  - ibuprofen (ADVIL,MOTRIN) 600 MG tablet  - F/u one month  1. The patient indicates understanding of these issues and agrees with the plan.  2.  The patient is given an After Visit Summary sheet that lists all of their medications with directions, their allergies, orders placed during this encounter, immunization dates, and follow- up instructions.  3. I reviewed the patient's medical information and medical history   4. The patient expressed understanding and no barriers to adherence were identified.  5.  I have reviewed the past medical, family, and social history sections including the medications and allergies listed in the above medical record      Shashana Fullington S. Yehudit Fulginiti, PA-C, 12/29/2016, 3:19 PM

## 2016-12-30 LAB — LYME IGG/IGM LINE BLOT REFLEX
LYME AB TOTAL IMMUNOGLOBULINS: <0.91 {ISR} (ref 0.00–0.90)
LYME DISEASE AB QUANT IGM: <0.8 index (ref 0.00–0.79)

## 2016-12-31 NOTE — Progress Notes (Signed)
Dear Priscilla Williams    Here are your results from your most recent visit. Your lyme test was negative.   If you have any questions please message me back.     Take care,  Jacklyne Baik

## 2017-01-17 ENCOUNTER — Ambulatory Visit (HOSPITAL_BASED_OUTPATIENT_CLINIC_OR_DEPARTMENT_OTHER): Payer: PRIVATE HEALTH INSURANCE | Admitting: Medical

## 2017-02-21 ENCOUNTER — Other Ambulatory Visit (HOSPITAL_BASED_OUTPATIENT_CLINIC_OR_DEPARTMENT_OTHER): Payer: Self-pay | Admitting: Family Medicine

## 2017-02-21 DIAGNOSIS — R12 Heartburn: Secondary | ICD-10-CM

## 2017-02-21 DIAGNOSIS — R202 Paresthesia of skin: Secondary | ICD-10-CM

## 2017-02-21 DIAGNOSIS — K580 Irritable bowel syndrome with diarrhea: Secondary | ICD-10-CM

## 2017-02-21 DIAGNOSIS — G43709 Chronic migraine without aura, not intractable, without status migrainosus: Secondary | ICD-10-CM

## 2017-02-21 MED ORDER — RANITIDINE HCL 150 MG PO TABS
150.0000 mg | ORAL_TABLET | Freq: Every day | ORAL | 11 refills | Status: AC
Start: 2017-02-21 — End: 2018-02-21

## 2017-02-21 MED ORDER — IBUPROFEN 600 MG PO TABS
600.0000 mg | ORAL_TABLET | Freq: Two times a day (BID) | ORAL | 1 refills | Status: AC | PRN
Start: 2017-02-21 — End: 2017-05-24

## 2017-02-21 MED ORDER — DOXEPIN HCL 10 MG PO CAPS
10.0000 mg | ORAL_CAPSULE | Freq: Every day | ORAL | 3 refills | Status: DC
Start: 2017-02-21 — End: 2018-03-01

## 2017-02-21 MED ORDER — RANITIDINE HCL 150 MG PO TABS: 150 mg | tablet | Freq: Every day | ORAL | 11 refills | 0 days | Status: AC

## 2017-02-21 MED ORDER — IBUPROFEN 600 MG PO TABS: 600 mg | tablet | Freq: Two times a day (BID) | ORAL | 1 refills | 0 days | Status: AC | PRN

## 2017-02-21 MED ORDER — DOXEPIN HCL 10 MG PO CAPS: 10 mg | capsule | Freq: Every day | ORAL | 3 refills | 0 days | Status: DC

## 2017-02-21 NOTE — Progress Notes (Signed)
PER Patient (self), Priscilla Williams is a 46 year old female has requested a refill of      - Fioricet   - Doxepin   - Ranitidine   - Ibuprofen       Last Office Visit: 12/27/2016 with Dortha Kern   Last Physical Exam: 09/12/2015      Other Med Adult:  Most Recent BP Reading(s)  12/27/16 : 138/84          Cholesterol (mg/dL)   Date Value   07/31/2015 225   ----------    LOW DENSITY LIPOPROTEIN DIRECT (mg/dL)   Date Value   07/31/2015 125   ----------    HIGH DENSITY LIPOPROTEIN (mg/dL)   Date Value   07/31/2015 72   ----------  No results found for: TG        THYROID SCREEN TSH REFLEX FT4 (uIU/mL)   Date Value   07/23/2016 2.250   ----------        TSH (THYROID STIM HORMONE) (uIU/mL)   Date Value   07/31/2015 1.290   ----------      HEMOGLOBIN A1C (%)   Date Value   07/23/2016 5.2   ----------    No results found for: POCA1C      No results found for: INR      SODIUM (mmol/L)   Date Value   07/23/2016 141   ----------      POTASSIUM (mmol/L)   Date Value   07/23/2016 4.5   ----------          CREATININE (mg/dL)   Date Value   07/23/2016 0.6   ----------    Documented patient preferred pharmacies:    [facility] [place], [place]  Phone: [phone] Fax: [phone]

## 2017-02-25 MED ORDER — BUTALBITAL-APAP-CAFFEINE 50-325-40 MG PO TABS
1.0000 | ORAL_TABLET | Freq: Four times a day (QID) | ORAL | 0 refills | Status: DC | PRN
Start: 2017-02-25 — End: 2018-04-28

## 2017-02-25 MED ORDER — BUTALBITAL-APAP-CAFFEINE 50-325-40 MG PO TABS: 1 | tablet | Freq: Four times a day (QID) | ORAL | 0 refills | 0 days | Status: AC | PRN

## 2017-02-25 NOTE — Progress Notes (Signed)
Priscilla Rua, MD, 02/25/2017, 10:44 AM  Chart reviewed  Prescribed Fioricet for acute migraine 3 times so far  Never seen a neurologist  Next visit  Discuss options  Try alternatives  Or see neurologist to go on an alternative considering fioricet has so many SE  Will need to be seen in clinic before any further refills

## 2017-04-11 ENCOUNTER — Telehealth (HOSPITAL_BASED_OUTPATIENT_CLINIC_OR_DEPARTMENT_OTHER): Payer: Self-pay | Admitting: Family Medicine

## 2017-04-11 NOTE — Progress Notes (Signed)
Labs faxed to Dr.Hein at [facility] appointment 1/19.

## 2017-04-11 NOTE — Telephone Encounter (Signed)
-----   Message from Delene Ruffini sent at 04/11/2017  3:50 PM EDT -----  Regarding:  requesting all lab work be faxed to office (Dr. Meryl Crutch @ [facility])  Contact: Jell[place] [phone], 46 year old, female    Calls today:  Clinical Questions ([facility])    Specific nature of requesting all lab work be faxed to office (Dr. Meryl Crutch @ [facility]) Fax is [phone]  Return phone number [phone]  Person calling on behalf of patient: Patient (self)    Patient's PCP: Lavonna Rua, MD

## 2017-05-02 ENCOUNTER — Encounter (HOSPITAL_BASED_OUTPATIENT_CLINIC_OR_DEPARTMENT_OTHER): Payer: Self-pay | Admitting: Family Medicine

## 2017-05-02 DIAGNOSIS — R202 Paresthesia of skin: Secondary | ICD-10-CM | POA: Insufficient documentation

## 2017-06-24 ENCOUNTER — Encounter (HOSPITAL_BASED_OUTPATIENT_CLINIC_OR_DEPARTMENT_OTHER): Payer: Self-pay | Admitting: Family Medicine

## 2017-06-24 DIAGNOSIS — K580 Irritable bowel syndrome with diarrhea: Secondary | ICD-10-CM

## 2017-06-24 HISTORY — PX: CERVICAL FUSION: SHX112

## 2017-06-25 NOTE — Telephone Encounter (Signed)
[facility] has refills on file for Doxepin, new RXs are not needed at this time.

## 2017-07-03 DIAGNOSIS — M4712 Other spondylosis with myelopathy, cervical region: Secondary | ICD-10-CM | POA: Diagnosis not present

## 2017-07-03 DIAGNOSIS — M4802 Spinal stenosis, cervical region: Secondary | ICD-10-CM | POA: Diagnosis not present

## 2017-07-03 DIAGNOSIS — E669 Obesity, unspecified: Secondary | ICD-10-CM | POA: Diagnosis not present

## 2017-07-03 DIAGNOSIS — Z87891 Personal history of nicotine dependence: Secondary | ICD-10-CM | POA: Diagnosis not present

## 2017-07-03 DIAGNOSIS — Z6841 Body Mass Index (BMI) 40.0 and over, adult: Secondary | ICD-10-CM | POA: Diagnosis not present

## 2017-07-03 DIAGNOSIS — G4733 Obstructive sleep apnea (adult) (pediatric): Secondary | ICD-10-CM | POA: Diagnosis not present

## 2017-07-03 DIAGNOSIS — G43909 Migraine, unspecified, not intractable, without status migrainosus: Secondary | ICD-10-CM | POA: Diagnosis not present

## 2017-07-03 DIAGNOSIS — M5001 Cervical disc disorder with myelopathy,  high cervical region: Secondary | ICD-10-CM | POA: Diagnosis not present

## 2017-07-03 DIAGNOSIS — M9951 Intervertebral disc stenosis of neural canal of cervical region: Secondary | ICD-10-CM | POA: Diagnosis not present

## 2017-07-03 DIAGNOSIS — Z981 Arthrodesis status: Secondary | ICD-10-CM | POA: Diagnosis not present

## 2017-07-03 DIAGNOSIS — M40202 Unspecified kyphosis, cervical region: Secondary | ICD-10-CM | POA: Diagnosis not present

## 2017-07-03 DIAGNOSIS — M1990 Unspecified osteoarthritis, unspecified site: Secondary | ICD-10-CM | POA: Diagnosis not present

## 2017-07-03 DIAGNOSIS — M50322 Other cervical disc degeneration at C5-C6 level: Secondary | ICD-10-CM | POA: Diagnosis not present

## 2017-07-29 DIAGNOSIS — M542 Cervicalgia: Secondary | ICD-10-CM | POA: Diagnosis not present

## 2017-07-29 DIAGNOSIS — M4712 Other spondylosis with myelopathy, cervical region: Secondary | ICD-10-CM

## 2017-08-12 DIAGNOSIS — M4712 Other spondylosis with myelopathy, cervical region: Secondary | ICD-10-CM

## 2017-08-12 DIAGNOSIS — M542 Cervicalgia: Secondary | ICD-10-CM | POA: Diagnosis not present

## 2017-08-19 DIAGNOSIS — M5000 Cervical disc disorder with myelopathy, unspecified cervical region: Secondary | ICD-10-CM | POA: Diagnosis not present

## 2017-08-20 DIAGNOSIS — M5000 Cervical disc disorder with myelopathy, unspecified cervical region: Secondary | ICD-10-CM | POA: Diagnosis not present

## 2017-08-27 DIAGNOSIS — M5000 Cervical disc disorder with myelopathy, unspecified cervical region: Secondary | ICD-10-CM | POA: Diagnosis not present

## 2017-09-01 DIAGNOSIS — M5000 Cervical disc disorder with myelopathy, unspecified cervical region: Secondary | ICD-10-CM | POA: Diagnosis not present

## 2017-09-03 ENCOUNTER — Ambulatory Visit (HOSPITAL_BASED_OUTPATIENT_CLINIC_OR_DEPARTMENT_OTHER): Payer: PRIVATE HEALTH INSURANCE | Admitting: Medical

## 2017-09-03 VITALS — BP 130/82 | HR 93 | Wt 207.0 lb

## 2017-09-03 DIAGNOSIS — R030 Elevated blood-pressure reading, without diagnosis of hypertension: Secondary | ICD-10-CM | POA: Diagnosis not present

## 2017-09-03 DIAGNOSIS — M5000 Cervical disc disorder with myelopathy, unspecified cervical region: Secondary | ICD-10-CM | POA: Diagnosis not present

## 2017-09-03 DIAGNOSIS — R310 Gross hematuria: Secondary | ICD-10-CM

## 2017-09-03 DIAGNOSIS — M4712 Other spondylosis with myelopathy, cervical region: Secondary | ICD-10-CM

## 2017-09-03 DIAGNOSIS — R1011 Right upper quadrant pain: Secondary | ICD-10-CM | POA: Diagnosis not present

## 2017-09-03 DIAGNOSIS — K582 Mixed irritable bowel syndrome: Secondary | ICD-10-CM | POA: Diagnosis not present

## 2017-09-03 LAB — CBC, PLATELET & DIFFERENTIAL
ABSOLUTE BASO COUNT: 0 10*3/uL (ref 0.0–0.1)
ABSOLUTE EOSINOPHIL COUNT: 0.1 10*3/uL (ref 0.0–0.8)
ABSOLUTE IMM GRAN COUNT: 0.03 10*3/uL (ref 0.00–0.03)
ABSOLUTE LYMPH COUNT: 2.7 10*3/uL (ref 0.6–5.9)
ABSOLUTE MONO COUNT: 0.9 10*3/uL (ref 0.2–1.4)
ABSOLUTE NEUTROPHIL COUNT: 7.4 10*3/uL (ref 1.6–8.3)
BASOPHIL %: 0.3 % (ref 0.0–1.2)
EOSINOPHIL %: 1 % (ref 0.0–7.0)
HEMATOCRIT: 41.6 % (ref 34.1–44.9)
HEMOGLOBIN: 13.4 g/dL (ref 11.2–15.7)
IMMATURE GRANULOCYTE %: 0.3 % (ref 0.0–0.4)
LYMPHOCYTE %: 24 % (ref 15.0–54.0)
MEAN CORP HGB CONC: 32.2 g/dL (ref 31.0–37.0)
MEAN CORPUSCULAR HGB: 27.6 pg (ref 26.0–34.0)
MEAN CORPUSCULAR VOL: 85.6 fL (ref 80.0–100.0)
MEAN PLATELET VOLUME: 11.1 fL (ref 8.7–12.5)
MONOCYTE %: 8.4 % (ref 4.0–13.0)
NEUTROPHIL %: 66 % (ref 40.0–75.0)
PLATELET COUNT: 360 10*3/uL (ref 150–400)
RBC DISTRIBUTION WIDTH STD DEV: 43.2 fL (ref 35.1–46.3)
RBC DISTRIBUTION WIDTH: 14 % (ref 11.5–14.3)
RED BLOOD CELL COUNT: 4.86 M/uL (ref 3.90–5.20)
WHITE BLOOD CELL COUNT: 11.2 10*3/uL — ABNORMAL HIGH (ref 4.0–11.0)

## 2017-09-03 LAB — COMP METABOLIC PANEL, FASTING
ALANINE AMINOTRANSFERASE: 19 U/L (ref 12–45)
ALBUMIN: 3.8 g/dL (ref 3.4–5.0)
ALKALINE PHOSPHATASE: 103 U/L (ref 45–117)
ANION GAP: 5 mmol/L (ref 5–15)
ASPARTATE AMINOTRANSFERASE: 13 U/L (ref 8–34)
BILIRUBIN TOTAL: 0.2 mg/dL (ref 0.2–1.0)
BUN (UREA NITROGEN): 11 mg/dL (ref 7–18)
CALCIUM: 8.8 mg/dL (ref 8.5–10.1)
CARBON DIOXIDE: 30 mmol/L (ref 21–32)
CHLORIDE: 104 mmol/L (ref 98–107)
CREATININE: 0.6 mg/dL (ref 0.4–1.2)
ESTIMATED GLOMERULAR FILT RATE: >60 mL/min (ref >60)
GLUCOSE FASTING: 88 mg/dL (ref 74–106)
POTASSIUM: 4.2 mmol/L (ref 3.5–5.1)
SODIUM: 139 mmol/L (ref 136–145)
TOTAL PROTEIN: 7.1 g/dL (ref 6.4–8.2)

## 2017-09-03 LAB — LIPASE: LIPASE: 230 U/L (ref 73–393)

## 2017-09-03 LAB — POC URINALYSIS
BILIRUBIN, URINE: NEGATIVE
GLUCOSE,URINE: NEGATIVE
KETONE, URINE: NEGATIVE
LEUKOCYTE ESTERASE: NEGATIVE
NITRITE, URINE: NEGATIVE
PH URINE: 7.5 (ref 5.0–8.0)
PROTEIN, URINE: 30 — AB
SPECIFIC GRAVITY, URINE: 1.02 (ref 1.003–1.030)
UROBILINOGEN URINE: 0.2 (ref 0.2–1.0)

## 2017-09-03 LAB — URINALYSIS RFLX TO URINE CULT
BILIRUBIN, URINE: NEGATIVE
CASTS: NONE SEEN PER LPF
CRYSTALS: NONE SEEN
GLUCOSE, URINE: NEGATIVE MG/DL
KETONE, URINE: NEGATIVE MG/DL
LEUKOCYTE ESTERASE: NEGATIVE
NITRITE, URINE: NEGATIVE
PH URINE: 7 (ref 5.0–8.0)
SPECIFIC GRAVITY URINE: 1.015 (ref 1.003–1.035)

## 2017-09-03 LAB — GAMMA GLUTAMYL TRANSFERASE: GAMMA GLUTAMYL TRANSFERASE: 113 IU/L — ABNORMAL HIGH (ref 5–85)

## 2017-09-03 LAB — URINE CULTURE WILL NOT BE DONE

## 2017-09-03 NOTE — Progress Notes (Addendum)
[facility]  Office Visit Note   Patient presents with:  Abdominal Pain    Subjective:   Priscilla Williams is a 47 year old female patient who presents today for Patient presents with:  Abdominal Pain    1)Abd pain   -Surgery on neck in January 2019- multiple disc fusions performed at [facility]     -Was constipated after surgery requiring bowel reg + enemas 2/2 opioids  -No longer taking opioids for pain, tylenol only.   -Not constipated now.  -Alternates between constipation + diarrhea sx.   -Ab pain since January following surgery.   -RUQ pain   -Sometimes "shooting", sometimes "dull ache"   -occassionally radiates to back.   -Not worse after eating  -No reflux sx.   -No nausea or vomiting.   -No changes in BM color or size.   -No travel or sick contacts.   -No recent trauma or injury.   -Hx IBS: takes doxepin 64m, does help. Takes OTC metamucil which also helps. Diet: triggers are dairy and gluten. Has been talked to about elimination diet, would like info on fodmap diet.   -Pain since January feels different than prior IBS pain.   -Never had colonoscopy.   -Sisters also have IBS.       2) Blood in urine.   -Noticed shortly after surgery in January.   -Never happened before this/Has not been previously seen for.   -Blood when wiping- even when not menstruating.   -Happens every micturition.   -Has also noticed blood in urine, urine color is darker.   -No other urinary sx: freq, urgency, burning.   -No fever/chills.   -No recent sick sx.     ROS: see HPI     Medications, Allergies, FHx and Social Hx reviewed in Epic and current.    Problem List:   Patient Active Problem List:     Migraine     Heartburn     Irritable bowel syndrome     Obesity (BMI 30.0-39.9)     Nasal polyps     Trochanteric bursitis     Acute pain of right knee     Paresthesia      Objective:   BP (!) 140/98  Pulse 93  Wt 93.9 kg (207 lb)  SpO2 98%  BMI 39.11 kg/m2  General: Well-appearing in no distress  Cardio: regular rate   Pulmonary:  normal effort.   Abd: soft, non-distended. +TTP RUQ, negative Murphys sign. No referred pain. No CVA or suprapubic tenderness.   Bowel sounds normal. No masses,  no organomegaly. No overlying skin changes.   Ext: no cyanosis or edema    Skin: skin color, texture, turgor are normal, No rashes or lesions  Psych: Interactive, Normal Mood   Neuro: Alert and oriented X 3. Strength grossly intact      Assessment & Plan:      (R10.11) RUQ pain  Comment: daily pain since late January. Unclear etiology. May be constipation given hx IBS, though pt reports pain seems different than previous sx related to this. Possible biliary etiology, but pain not worse post-prandially + negative Murphy's sign. No known underlying liver disease but cannot rule out. Will get appropriate labs. Pt educated on diet and continuing current regimen for IBS.     Plan:   -COLLECTION VENOUS BLOOD VENIPUNCTURE  COMP  METABOLIC PANEL, FASTING  -GAMMA GLUTAMYL TRANSFERASE  -LIPASE  -CBC, PLATELET & DIFFERENTIAL  -Educated on fodmap diet for IBS.   -  rec to continue metamucil and doxepin for IBS sx, also recommended starting probiotic.             (R31.0) Gross hematuria  Comment: pt reporting ~2 mos blood in urine. Urinalysis 5-10 RBCs,  +protein. Unclear etiology- may be AKI vs. underlying kidney pathology vs. Bladder etiology.  No urinary sx or CVA tenderness to suggest UTI/pyelo. Will plan to repeat urine at next visit prior to urology referral.     Plan:   -COMP METABOLIC PANEL, FASTING,   -URINALYSIS RFLX TO URINE CULT  -repeat urine at next visit.          _____________________    1. The patient indicates understanding of these issues and agrees with the plan.  2.  The patient is given an After Visit Summary sheet that lists all of their medications with directions, their allergies, orders placed during this encounter, immunization dates, and follow- up instructions.  3. I reviewed the patient's medical information and medical history   4.  I  reconciled the patient's medication list and prepared and supplied needed refills.  5.  I have reviewed the past medical, family, and social history sections including the medications and allergies listed in the above medical record    [place] Mariesha Venturella PA-S

## 2017-09-04 ENCOUNTER — Encounter (HOSPITAL_BASED_OUTPATIENT_CLINIC_OR_DEPARTMENT_OTHER): Payer: Self-pay | Admitting: Medical

## 2017-09-04 DIAGNOSIS — M4712 Other spondylosis with myelopathy, cervical region: Secondary | ICD-10-CM | POA: Insufficient documentation

## 2017-09-04 NOTE — Progress Notes (Signed)
[facility]  Office Visit Note   Subjective:   CC: Priscilla Williams is a 47 year old female patient who presents today for abdominal pain     1)Abd pain   -Surgery on neck in January 2019- multiple disc fusions performed at [facility].     -Was constipated after surgery requiring bowel reg + enemas 2/2 opioids   - has h/o IBS - primarily constipation - but that pain is lower abd - feels different than current pain   -No longer taking opioids for pain, tylenol only.   -Alternates between constipation + diarrhea sx.   -Ab pain since January following surgery.   -now more RUQ pain   -Sometimes "shooting", sometimes "dull ache"   - not crampy, not worse with fatty foods   -occassionally radiates to back.   -Not worse after eating  -No reflux sx.   -No nausea or vomiting.   -No changes in BM color or size  - No bloody or black stools   -No travel or sick contacts.   -No recent trauma or injury.   -Hx IBS: takes doxepin 37m, does help. Takes OTC metamucil, but not daily, which also helps. Diet: triggers are dairy and gluten. Has been talked to about elimination diet, would like info on fodmap diet.   -Pain since January feels different than prior IBS pain.   -Never had colonoscopy.   -Sisters also have IBS.       2) Blood in urine.   -Noticed shortly after surgery in January.   - then got period and thought was just that, but has continued  - periods have been regular   -Never happened before this/Has not been previously seen for.   -Blood when wiping- even when not menstruating.   - doesn't fill a pad - doesn't think it's vaginal  -Happens every micturition.   -Has also noticed blood in urine/ toilet, urine color is darker.   -No other urinary sx: freq, urgency, burning.   - no lower abd pain   -No fever/chills/n/v  -No recent illness    ROS:  Per HPI   Objective:   BP 130/82  Pulse 93  Wt 93.9 kg (207 lb)  SpO2 98%  BMI 39.11 kg/m2  Pain Score: Data Unavailable    Physical Exam:  General: alert, appears  stated age and cooperative  Eyes: EOMI, Conjunctiva Clear  Cardio: S1, S2 normal, regular rate and rhythm, no murmurs, rubs or gallops, no LE edema  Pulmonary: clear to auscultation and no wheezing, rhonchi, crackles  GI: soft, non-distended, mild RUQ TTP, negative Murphy sign. Bowel sounds normal. No masses,  no organomegaly. No rebound TTP. No CVA TTP.   MS: no pain with spinal rotation, lateral motion. No chest wall TTP. No rib TTP.   Skin: skin color, texture, turgor are normal, No rashes or lesions  Psych: interactive and Normal Mood   Neuro: Alert and oriented X 3. Strength grossly intact. Normal coordination and gait.    Assessment & Plan:     1. RUQ pain  2. Irritable bowel syndrome with both constipation and diarrhea  47y/o F with h/o IBS (on doxepin and prn metamucil) with recent severe opioid induced constipation post-op in January, now c/o more constant pain localized to RUQ, occasionally radiating to the back. Non-acute abdomen today, no sign of infection. Considering biliary etiology, however pain is not associated with food and is non-colicky, negative Murphy sign on exam. With back radiation, will r/o  pancreatitis with lipase, however less likely. No h/o liver pathology and would be less likely to cause pain. Will order basic labs and call with results. Consider [place].   - Recommended bland diet, low fat, may try FODMAP diet for possible IBS contribution  - Increase metamucil to daily, continue 2 L water daily  - COLLECTION VENOUS BLOOD VENIPUNCTURE  - COMP METABOLIC PANEL, FASTING  - GAMMA GLUTAMYL TRANSFERASE  - LIPASE  - CBC, PLATELET & DIFFERENTIAL - WBC slightly elevated, reviewed [facility] labs and has improved since post-op  - ED precautions reviewed for symptoms of acute obstruction or infection     3. Gross hematuria  2 mos of gross hematuria that started after spine surgery in 06/2017, then menstruated and thought was extended period spotting. However, has continued with almost every urination, on  toilet paper and in toilet with Urine dip showing large blood. No signs of infection, sending UA reflex culture. No suprapubic or CVA TTP. No flank pain to suggest nephrolithiasis. Will get renal function labs and UA. Consider RTC for pelvic exam to determine vaginal vs urethral origin. Consider urology referral if true hematuria.   - reviewed [facility] labs - no blood in UA pre-op 06/2017  - POC URINALYSIS  - COMP METABOLIC PANEL, FASTING  - URINALYSIS RFLX TO URINE CULT  - URINE CULTURE WILL NOT BE DONE    4. Osteoarthritis of cervical spine with myelopathy  Reviewed [facility] neurosurgery notes: pt underwent extensive cervical spinal fusion in 06/2017. Doing well, PT currently.     5. Elevated BP without diagnosis of hypertension  Improved on recheck. Monitor at next visit.     HM: deferred   Next Visit:  [ ]  ask about EtOH and frequency of fioricet   [ ]  gyn exam vs urology referral if true hematuria     We discussed the patients current medications. The patient expressed understanding and no barriers to adherence were identified.  The patient was given an AVS  [employer], PA-C 09/04/2017

## 2017-09-05 ENCOUNTER — Encounter (HOSPITAL_BASED_OUTPATIENT_CLINIC_OR_DEPARTMENT_OTHER): Payer: Self-pay | Admitting: Medical

## 2017-09-05 ENCOUNTER — Telehealth (HOSPITAL_BASED_OUTPATIENT_CLINIC_OR_DEPARTMENT_OTHER): Payer: Self-pay | Admitting: Family Medicine

## 2017-09-05 ENCOUNTER — Telehealth (HOSPITAL_BASED_OUTPATIENT_CLINIC_OR_DEPARTMENT_OTHER): Payer: Self-pay | Admitting: Medical

## 2017-09-05 DIAGNOSIS — R310 Gross hematuria: Secondary | ICD-10-CM

## 2017-09-05 NOTE — Progress Notes (Signed)
Left phone message and sent Mychart message

## 2017-09-05 NOTE — Progress Notes (Signed)
Called and spoke w pt.  Explained to her that a liver enzyme test was elevated and she had some blood in her urine.  Other labs were WNL    Fioricet:  - last taken 2 weeks prior to labs    Alcohol:  - had 2 glasses of wine on 08/27/17. Prior to this last alcoholic intake was 4-5 months ago.    F/u appt scheduled in 2 weeks with Amber,PA-C for 09/17/17 to f/u on labs and exam.    Pt gets GYN care elsewhere, she may consider going to GYN for the pelvic exam. Will decide prior to next visit in clinic.    Message to [employer].

## 2017-09-05 NOTE — Telephone Encounter (Signed)
-----   Message from Valentina Gu sent at 09/05/2017  2:13 PM EDT -----  Regarding: Returning Call   Contact: Dell [phone], 47 year old, female    Calls today:  Clinical Questions ([facility])    Name of person calling Janann   Specific nature of request Patient is returning call. Thank you   Return phone number [phone]   Person calling on behalf of patient: Patient (self)        Patient's language of care: English    Patient does not need an interpreter.    Patient's PCP: Boris Sharper, MD, MD

## 2017-09-05 NOTE — Progress Notes (Deleted)
I tried returning this pt's call ,no answer, L/M to please call back.

## 2017-09-05 NOTE — Progress Notes (Signed)
Left message for pt to call back to discuss lab results, also sending mychart message.    RN may discuss: Normal labs except 1 (GGT) and she does have confirmed blood in her urine, culture pending:     Isolated elevated GGT at 113 (WBC 11.2, but stable from prior, so doubt infection). Per UTD and [facility], likely isolated elevated GGT without obstructive pattern (normal LFTs, Alk phos) is not related to biliary etiology. Recommended asking about EtOH and meds including barbiturates, which pt is prescribed fioricet.     - Please ask if she drank alcohol the day prior to her visit / her frequency of alcohol use.   - Please ask how frequently she uses fioricet for headaches and if she used it the day prior or day of her visit (3/13)    Also UA with RBC 5-10 pHPF, no leukocytes, but did add urine culture for workup of hematuria.   - Recommend RTC in 2 weeks for repeat urinalysis also pelvic exam to r/o vaginal source of bleeding and also to f/u on her abdominal pain.     [address] [place], PA-C, 09/05/2017, 1:49 PM

## 2017-09-06 LAB — URINE CULTURE

## 2017-09-10 DIAGNOSIS — M5000 Cervical disc disorder with myelopathy, unspecified cervical region: Secondary | ICD-10-CM | POA: Diagnosis not present

## 2017-09-15 DIAGNOSIS — M5000 Cervical disc disorder with myelopathy, unspecified cervical region: Secondary | ICD-10-CM | POA: Diagnosis not present

## 2017-09-17 ENCOUNTER — Ambulatory Visit (HOSPITAL_BASED_OUTPATIENT_CLINIC_OR_DEPARTMENT_OTHER): Payer: PRIVATE HEALTH INSURANCE | Admitting: Medical

## 2017-09-17 VITALS — BP 114/95 | HR 97 | Wt 206.0 lb

## 2017-09-17 DIAGNOSIS — K581 Irritable bowel syndrome with constipation: Secondary | ICD-10-CM

## 2017-09-17 DIAGNOSIS — R1011 Right upper quadrant pain: Secondary | ICD-10-CM

## 2017-09-17 DIAGNOSIS — R310 Gross hematuria: Secondary | ICD-10-CM

## 2017-09-17 LAB — URINALYSIS
BACTERIA: >50 PER HPF — AB (ref 0–5)
BILIRUBIN, URINE: NEGATIVE
CASTS: NONE SEEN PER LPF
CRYSTALS: NONE SEEN
GLUCOSE, URINE: NEGATIVE MG/DL
KETONE, URINE: NEGATIVE MG/DL
LEUKOCYTE ESTERASE: NEGATIVE
NITRITE, URINE: NEGATIVE
PH URINE: 7 (ref 5.0–8.0)
PROTEIN, URINE: NEGATIVE MG/DL
SPECIFIC GRAVITY URINE: 1.009 (ref 1.003–1.035)
SQUAMOUS EPITHELIAL CELLS: >10 PER LPF — AB (ref 0–4)

## 2017-09-17 LAB — POC URINALYSIS
BILIRUBIN, URINE: NEGATIVE
GLUCOSE,URINE: NEGATIVE
KETONE, URINE: NEGATIVE
NITRITE, URINE: NEGATIVE
PH URINE: 7 (ref 5.0–8.0)
PROTEIN, URINE: NEGATIVE
SPECIFIC GRAVITY, URINE: 1.015 (ref 1.003–1.030)
UROBILINOGEN URINE: 0.2 (ref 0.2–1.0)

## 2017-09-17 NOTE — Progress Notes (Addendum)
[facility]  Office Visit Note   Patient presents with:  Follow Up    Subjective:   Priscilla Williams is a 47 year old female patient who presents today for Patient presents with:  Follow Up    1)F/u abdominal pain   -Labs from last OV WNL except for isolated elevation in GGT- unclear etiology, no ETOH use or Fioricet use to explain.   -Still RUQ, sometimes radiating to back.   -Overall thinks getting better, still always there.   -"Good days and bad days"   -Thinks much more constipated now.   -Hard stools, BMs every other day.   -Still no blood or changes in stool size.   -Still not worse after eating.   -No N/V  -Per last visit recs started metamucil daily then stopped for a bit because of worrying about it causing constipation. Now using about every other day.   -hasn't been following FODMAP diet, just trying to overall eat better - more protein, more veggies, drinking at least 2L water/day.   -Hx IBS: still taking doxepin nightly, thinks helps.      2) F/u hematuria.   -Blood in urine stopped about 4 days ago   -Relieved for now- but has stopped and then started again before.   -Unsure of LMP because of confusion with blood when urinating.   -Does not think blood was ever from vaginal source- noticed blood in toilet and on TP only after urination.  Would wear pad during the day and never noticed any blood on it except for when menstruating.   -No fevers/chills.    -No burning/inc freq/urgency.   -No lower abd pain/flank pain.   -No vaginal sx of itching, redness, discharge.   -Never smoker    ROS: see HPI     Medications, Allergies, FHx and Social Hx reviewed in Epic and current.    Problem List:   Patient Active Problem List:     Migraine     Heartburn     Irritable bowel syndrome     Obesity (BMI 30.0-39.9)     Nasal polyps     Trochanteric bursitis     Acute pain of right knee     Paresthesia     Osteoarthritis of cervical spine with myelopathy      Objective:   BP (!) 114/95 (Site: LA, Position:  Sitting, Cuff Size: Lrg)  Pulse 97  Wt 93.4 kg (206 lb)  SpO2 100%  BMI 38.92 kg/m2     General: Well-appearing in no distress  Cardio: regular rate and rhythm, S1, S2 normal. No murmur   Pulmonary: clear to auscultation, no wheezing, rales or rhonchi.   Abd: soft, non-tender, non-distended. Bowel sounds normal. No masses,  no organomegaly. Negative Murphys sign. No CVA tenderness.   Ext: no cyanosis or edema    Skin: skin color, texture, turgor are normal, No rashes or lesions  Psych: Interactive, Normal Mood   Neuro: Alert and oriented X 3. Strength grossly intact. Normal coordination and gait.    Assessment & Plan:      (R10.11) RUQ pain  Comment: 47 year old F with history of IBS (on doxepin and metamucil) returning to office for follow up of RUQ pain occasionally radiating to back. Unclear etiology, little to no improvement of sx with metamucil and diet modifications. History and labs from last visit not consistent with biliary etiology, liver pathology, or pancreatitis. Given persistance of sx and inconclusive lab workup will pursue US  imaging for further diagnostic eval. Will also plan to refer to GI as pt has never seen for formal evaluation of IBS.      Plan:  -US ABDOMINAL REAL TIME W/IMAGE DOCUMENTATION  -Referral to GI.   -Alternate between metamucil and miralax daily for constipation. Continue to hydrate with at least 2L water daily.   -Bland diet, continue to avoid trigger foods.   -Return to office PRN. ED precautions for sx acute obstruction or infection.         (R31.0) Gross hematuria  Comment: hx of ~51month gross hematuria, pt now reporting resolved at this time. UA at OV today +small blood, will plan to send UA for culture. Hx of blood only when urinating and no vaginal sx makes vaginal source of bleeding unlikely. No suprapubic tenderness or urinary sx to suggest infection.  No flank pain/CVA tenderness to suggest nephrolithiasis. Age, no hx of smoking make bladder ca unlikely. Given resolution  of sx for now will plan for close monitoring, may consider referral to urology if sx return/pending results of UCx.     Plan:   -URINALYSIS, URINE CULTURE- will follow up when results available.   -Follow up for returning sx- may consider referral to urology.       _____________________    1. The patient indicates understanding of these issues and agrees with the plan.  2.  The patient is given an After Visit Summary sheet that lists all of their medications with directions, their allergies, orders placed during this encounter, immunization dates, and follow- up instructions.  3. I reviewed the patient's medical information and medical history   4.  I reconciled the patient's medication list and prepared and supplied needed refills.  5.  I have reviewed the past medical, family, and social history sections including the medications and allergies listed in the above medical record    [place] PA-S

## 2017-09-17 NOTE — Progress Notes (Signed)
Discussed in visit.

## 2017-09-18 ENCOUNTER — Encounter (HOSPITAL_BASED_OUTPATIENT_CLINIC_OR_DEPARTMENT_OTHER): Payer: Self-pay | Admitting: Family Medicine

## 2017-09-18 LAB — URINE CULTURE

## 2017-09-18 NOTE — Progress Notes (Signed)
Culture pending

## 2017-09-19 DIAGNOSIS — M5000 Cervical disc disorder with myelopathy, unspecified cervical region: Secondary | ICD-10-CM | POA: Diagnosis not present

## 2017-09-19 NOTE — Progress Notes (Signed)
[facility] FAMILY MEDICINE  Office Visit Note   Subjective:   CC: Priscilla Williams is a 47 year old female patient who presents today for f/u abd pain and hematuria     1)F/u abdominal pain   -Labs from last OV WNL except for isolated elevation in GGT- unclear etiology, no ETOH use or Fioricet use to explain.   -Still RUQ, sometimes radiating to back.   -Overall thinks getting better, still always there.   -"Good days and bad days"   -Thinks much more constipated now.   -Hard stools, BMs every other day.   -Still no blood or changes in stool size.   -Still not worse after eating.   -No N/V  -Per last visit recs started metamucil daily then stopped for a bit because of worrying about it causing constipation. Now using about every other day.   -hasn't been following FODMAP diet, just trying to overall eat better - more protein, more veggies, drinking at least 2L water/day.   -Hx IBS: still taking doxepin nightly, thinks helps.  - no fever    2) hematuria.   -Blood in urine stopped about 4 days ago   -Relieved for now- but has stopped and then started again before.   -Does not think blood was ever from vaginal source- noticed blood in toilet and on TP only after urination.  Would wear pad during the day and never noticed any blood on it except for when menstruating.   - continuing daily OCP, no missed pills   -No fevers/chills.    -No burning/inc freq/urgency.   -No lower abd pain/flank pain.   -No vaginal sx of itching, redness, discharge.   -Never smoker    ROS:  Per HPI   Objective:   BP (!) 114/95 (Site: LA, Position: Sitting, Cuff Size: Lrg)  Pulse 97  Wt 93.4 kg (206 lb)  SpO2 100%  BMI 38.92 kg/m2  Pain Score: Data Unavailable    Physical Exam:  General: alert, appears stated age and cooperative  Eyes: EOMI, Conjunctiva Clear  Cardio: S1, S2 normal, regular rate and rhythm, no murmurs, rubs or gallops, no LE edema  Pulmonary: clear to auscultation and no wheezing, rhonchi, crackles  GI: soft, non-tender.  Bowel sounds normal. No masses,  no organomegaly. No CVA TTP. Negative Murphy sign   MS: no chest wall or rib TTP  Skin: skin color, texture, turgor are normal, No rashes or lesions  Psych: interactive and Normal Mood   Neuro: Alert and oriented X 3. Strength grossly intact. Normal coordination and gait.    Assessment & Plan:   1. RUQ pain  2. Irritable bowel syndrome with constipation  47 y/o F with h/o IBS (on doxepin and metamucil) with recent severe opioid induced constipation post-op in January, now with new more constant pain localized to RUQ, occasionally radiating to the back. Non-acute abdomen, no sign of infection. Considering biliary etiology, however pain is not associated with food and is non-colicky, negative Murphy sign on exam. Labs with isolated elevated GGT at 113 without obstructive pattern (LFTs, Alk phos normal), normal lipase, WBC 11.2, but stable from prior, so doubt infection. Did have report of gross hematuria with RBC 5-10 on UA, report of resolution this week, repeating UA today. Ordering [place]. Referring to GI for evaluation and treatment of this pt with long h/o abdominal symptoms previoulsy attributed to IBS now worsening with new RUQ pain.   - continue bland foods  - continue metamucil (recommended daily) and  pt has colace stool softener at home  - 2 L water daily   - US ABDOMINAL REAL TIME W/IMAGE DOCUMENTATION; Future  - REFERRAL TO GASTROENTEROLOGY ( INT)  - f/u results by phone     3. Gross hematuria  hx of ~59month gross hematuria, pt now reporting resolved at this time. UA at last visit with RBC 5-10 phf, small blood on dip today. Hx of blood only when urinating and no blood on pad makes vaginal source of bleeding less likely. Pt declined GU exam or preg test, has not missed OCP. No suprapubic tenderness or urinary sx to suggest infection. No flank pain/CVA tenderness to suggest nephrolithiasis. No casts on UA to suggest renal damage, BMP normal. Age, no hx of smoking make bladder  ca unlikely. Repeating UA and culture today. Getting Abd [place] Consider urology referral.   - URINALYSIS  - URINE CULTURE  - will call with results     HM: deferred     We discussed the patients current medications. The patient expressed understanding and no barriers to adherence were identified.  The patient was given an AVS  [employer] PA-C 09/19/2017

## 2017-09-22 ENCOUNTER — Encounter (HOSPITAL_BASED_OUTPATIENT_CLINIC_OR_DEPARTMENT_OTHER): Payer: Self-pay | Admitting: Family Medicine

## 2017-09-22 ENCOUNTER — Encounter (HOSPITAL_BASED_OUTPATIENT_CLINIC_OR_DEPARTMENT_OTHER): Payer: Self-pay | Admitting: Medical

## 2017-09-22 NOTE — Progress Notes (Signed)
Mychart message sent.

## 2017-10-02 DIAGNOSIS — M5000 Cervical disc disorder with myelopathy, unspecified cervical region: Secondary | ICD-10-CM | POA: Diagnosis not present

## 2017-10-07 DIAGNOSIS — M5000 Cervical disc disorder with myelopathy, unspecified cervical region: Secondary | ICD-10-CM | POA: Diagnosis not present

## 2017-10-08 ENCOUNTER — Ambulatory Visit: Payer: Self-pay | Admitting: Medical

## 2017-10-08 DIAGNOSIS — K760 Fatty (change of) liver, not elsewhere classified: Secondary | ICD-10-CM | POA: Diagnosis not present

## 2017-10-08 DIAGNOSIS — Z8719 Personal history of other diseases of the digestive system: Secondary | ICD-10-CM | POA: Diagnosis not present

## 2017-10-08 DIAGNOSIS — R1011 Right upper quadrant pain: Secondary | ICD-10-CM

## 2017-10-08 LAB — US ABDOMEN COMPLETE

## 2017-10-08 NOTE — Addendum Note (Signed)
Addended byArdelle Balls on: 10/08/2017 08:42 AM     Modules accepted: Orders

## 2017-10-10 ENCOUNTER — Encounter (HOSPITAL_BASED_OUTPATIENT_CLINIC_OR_DEPARTMENT_OTHER): Payer: Self-pay | Admitting: Medical

## 2017-10-10 DIAGNOSIS — M5000 Cervical disc disorder with myelopathy, unspecified cervical region: Secondary | ICD-10-CM | POA: Diagnosis not present

## 2017-10-10 DIAGNOSIS — K760 Fatty (change of) liver, not elsewhere classified: Secondary | ICD-10-CM | POA: Insufficient documentation

## 2017-10-10 NOTE — Progress Notes (Signed)
MyChart message sent to pt

## 2017-10-17 DIAGNOSIS — M5000 Cervical disc disorder with myelopathy, unspecified cervical region: Secondary | ICD-10-CM | POA: Diagnosis not present

## 2017-10-22 DIAGNOSIS — M5000 Cervical disc disorder with myelopathy, unspecified cervical region: Secondary | ICD-10-CM | POA: Diagnosis not present

## 2017-10-29 DIAGNOSIS — M5000 Cervical disc disorder with myelopathy, unspecified cervical region: Secondary | ICD-10-CM | POA: Diagnosis not present

## 2017-11-07 DIAGNOSIS — M5000 Cervical disc disorder with myelopathy, unspecified cervical region: Secondary | ICD-10-CM | POA: Diagnosis not present

## 2017-12-01 ENCOUNTER — Ambulatory Visit (HOSPITAL_BASED_OUTPATIENT_CLINIC_OR_DEPARTMENT_OTHER): Payer: PRIVATE HEALTH INSURANCE | Admitting: Gastroenterology

## 2017-12-14 ENCOUNTER — Encounter (HOSPITAL_BASED_OUTPATIENT_CLINIC_OR_DEPARTMENT_OTHER): Payer: Self-pay | Admitting: Medical

## 2017-12-15 ENCOUNTER — Encounter (HOSPITAL_BASED_OUTPATIENT_CLINIC_OR_DEPARTMENT_OTHER): Payer: Self-pay | Admitting: Medical

## 2017-12-15 DIAGNOSIS — R3121 Asymptomatic microscopic hematuria: Secondary | ICD-10-CM | POA: Insufficient documentation

## 2018-03-01 ENCOUNTER — Other Ambulatory Visit (HOSPITAL_BASED_OUTPATIENT_CLINIC_OR_DEPARTMENT_OTHER): Payer: Self-pay | Admitting: Family Medicine

## 2018-03-01 DIAGNOSIS — K580 Irritable bowel syndrome with diarrhea: Secondary | ICD-10-CM

## 2018-03-03 ENCOUNTER — Encounter (HOSPITAL_BASED_OUTPATIENT_CLINIC_OR_DEPARTMENT_OTHER): Payer: Self-pay | Admitting: Family Medicine

## 2018-03-03 ENCOUNTER — Encounter (HOSPITAL_BASED_OUTPATIENT_CLINIC_OR_DEPARTMENT_OTHER): Payer: Self-pay | Admitting: Medical

## 2018-03-03 NOTE — Progress Notes (Signed)
PER Pharmacy, Priscilla Williams is a 47 year old female has requested a refill of doxepin .      Last Office Visit: 09/17/2017 with Norton Blizzard  Last Physical Exam: 09/12/2015    PAP SMEAR due on 01/22/2018  HPV SCREENING due on 01/22/2018    Other Med Adult:  Most Recent BP Reading(s)  09/17/17 : (!) 114/95        Cholesterol (mg/dL)   Date Value   07/31/2015 225     LOW DENSITY LIPOPROTEIN DIRECT (mg/dL)   Date Value   07/31/2015 125     HIGH DENSITY LIPOPROTEIN (mg/dL)   Date Value   07/31/2015 72     No results found for: TG      THYROID SCREEN TSH REFLEX FT4 (uIU/mL)   Date Value   07/23/2016 2.250         TSH (THYROID STIM HORMONE) (uIU/mL)   Date Value   07/31/2015 1.290       HEMOGLOBIN A1C (%)   Date Value   07/23/2016 5.2       No results found for: POCA1C      No results found for: INR    SODIUM (mmol/L)   Date Value   09/03/2017 139       POTASSIUM (mmol/L)   Date Value   09/03/2017 4.2           CREATININE (mg/dL)   Date Value   09/03/2017 0.6       Documented patient preferred pharmacies:      [facility] [place], [place] - [address]  Phone: [phone] Fax: [phone]

## 2018-03-04 ENCOUNTER — Encounter (HOSPITAL_BASED_OUTPATIENT_CLINIC_OR_DEPARTMENT_OTHER): Payer: Self-pay | Admitting: Family Medicine

## 2018-03-04 ENCOUNTER — Other Ambulatory Visit (HOSPITAL_BASED_OUTPATIENT_CLINIC_OR_DEPARTMENT_OTHER): Payer: Self-pay | Admitting: Family Medicine

## 2018-03-04 DIAGNOSIS — R109 Unspecified abdominal pain: Secondary | ICD-10-CM

## 2018-03-16 ENCOUNTER — Encounter (HOSPITAL_BASED_OUTPATIENT_CLINIC_OR_DEPARTMENT_OTHER): Payer: BC Managed Care – PPO | Admitting: Gastroenterology

## 2018-04-02 DIAGNOSIS — R197 Diarrhea, unspecified: Secondary | ICD-10-CM

## 2018-04-02 DIAGNOSIS — K625 Hemorrhage of anus and rectum: Secondary | ICD-10-CM | POA: Diagnosis not present

## 2018-04-02 DIAGNOSIS — R1011 Right upper quadrant pain: Secondary | ICD-10-CM | POA: Diagnosis not present

## 2018-04-03 DIAGNOSIS — R1011 Right upper quadrant pain: Secondary | ICD-10-CM | POA: Diagnosis not present

## 2018-04-03 DIAGNOSIS — K625 Hemorrhage of anus and rectum: Secondary | ICD-10-CM | POA: Diagnosis not present

## 2018-04-03 DIAGNOSIS — R197 Diarrhea, unspecified: Secondary | ICD-10-CM | POA: Diagnosis not present

## 2018-04-17 DIAGNOSIS — K760 Fatty (change of) liver, not elsewhere classified: Secondary | ICD-10-CM | POA: Diagnosis not present

## 2018-04-28 ENCOUNTER — Other Ambulatory Visit (HOSPITAL_BASED_OUTPATIENT_CLINIC_OR_DEPARTMENT_OTHER): Payer: Self-pay | Admitting: Medical

## 2018-04-28 ENCOUNTER — Ambulatory Visit: Payer: BC Managed Care – PPO | Attending: Internal Medicine | Admitting: Family Medicine

## 2018-04-28 ENCOUNTER — Encounter (HOSPITAL_BASED_OUTPATIENT_CLINIC_OR_DEPARTMENT_OTHER): Payer: Self-pay | Admitting: Family Medicine

## 2018-04-28 VITALS — BP 181/92 | HR 94 | Temp 98.4°F | Wt 197.0 lb

## 2018-04-28 DIAGNOSIS — H547 Unspecified visual loss: Secondary | ICD-10-CM

## 2018-04-28 DIAGNOSIS — R51 Headache: Secondary | ICD-10-CM | POA: Diagnosis not present

## 2018-04-28 DIAGNOSIS — I160 Hypertensive urgency: Secondary | ICD-10-CM | POA: Diagnosis not present

## 2018-04-28 DIAGNOSIS — R519 Headache, unspecified: Secondary | ICD-10-CM

## 2018-04-28 DIAGNOSIS — H539 Unspecified visual disturbance: Secondary | ICD-10-CM | POA: Diagnosis not present

## 2018-04-28 DIAGNOSIS — G43709 Chronic migraine without aura, not intractable, without status migrainosus: Secondary | ICD-10-CM

## 2018-04-28 DIAGNOSIS — M4712 Other spondylosis with myelopathy, cervical region: Secondary | ICD-10-CM | POA: Diagnosis not present

## 2018-04-28 DIAGNOSIS — I671 Cerebral aneurysm, nonruptured: Secondary | ICD-10-CM | POA: Diagnosis not present

## 2018-04-28 DIAGNOSIS — R200 Anesthesia of skin: Secondary | ICD-10-CM | POA: Diagnosis not present

## 2018-04-28 NOTE — Progress Notes (Signed)
[facility] FAMILY MEDICINE CLINIC    Subjective: Priscilla Williams is a 47 year old female who is here for the following issues. No interpreter was required during this patient encounter.    #Neuro sx:  -reports she developed a migraine that woke her up from sleep at 1am this morning--> took tylenol which did not relieve her symptoms, so took fiorcet at 2am  -didn't sleep well the rest of the night 2/2 pain but went to work anyways this morning--> while at work this morning, she developed "squiggles" in her left-sided vision, then lost almost all vision from left eye except a very small amount of medial left eye vision  -has 20+ year h/o migraines, almost always relieved w/ fiorcet  -reports this migraine is different in intensity & also w/ the vision loss  -patient reports she had cervical spine surgery at [facility] 1 year ago and L-sided brain aneurysm was discovered at that time--> was told by her [facility] neurologist to present for medical attention if her migraines ever became severe and did not resolve w/ medication  -denies n/v, chest pain, SOB      ROS: Reviewed as per HPI above. All other systems reviewed and negative.    Patient Active Problem List:     Migraine     Irritable bowel syndrome     Obesity (BMI 30.0-39.9)     Nasal polyps     Osteoarthritis of cervical spine with myelopathy     Hepatic steatosis     Asymptomatic microscopic hematuria     Brain aneurysm      MEDS:    Current Outpatient Medications:     Butalbital-APAP-Caffeine (FIORICET) 50-300-40 MG CAPS, Take 1 capsule by mouth, Disp: , Rfl:     doxepin (SINEQUAN) 10 MG capsule, TAKE 1 CAPSULE BY MOUTH ONCE DAILY, Disp: 90 capsule, Rfl: 3    norethindrone-ethinyl estradiol (LOESTRIN 1.5/30, 21,) 1.5-30 MG-MCG tablet, Take 1 tablet by mouth daily., Disp: , Rfl:     Review of Patient's Allergies indicates:  No Known Allergies    Objective:  BP (!) 181/92  Pulse 94  Temp 98.4 F (36.9 C) (Temporal)  Wt 89.4 kg (197 lb)  SpO2 98%  BMI 37.22 kg/m2  GEN:  Mild distress.  HEENT: PERRLA.  HEART: Tachycardic, regular rhythm.  NEURO: AO x 3. CN II-XII intact. Gait normal.    Assessment and Plan: Priscilla Williams is a 47 year old female with    1. Headache disorder  2. Loss of vision  3. Hypertensive urgency  DDx includes tension headache, migraine, impending intracranial hemorrhage. Patient has known h/o migraines that generally resolve w/ fiorcet x1. However, her current headache/migraine has additionally concerning characteristics, including vision loss and NOT resolving w/ fiorcet. In the setting of known L-sided aneurysm (noted on 04/2017 MRA brain at [facility]), acute L-sided vision loss, and hypertensive urgency, patient should be evaluated in the emergency department. Patient agrees to go to the [facility] ED [facility] per patient's desire since her neurologist is at [facility]), and her husband will promptly drive her there. I called the [facility] ED to give notification.      Dispo: Transfer to [facility] Emergency Department    I have reviewed the past medical, surgical, social and family history and updated these sections of EpicCare as relevant. All interim labs, test results, and consult notes were reviewed and discussed with Priscilla Williams. Medications were reconciled during this visit and a current medication list was given to the patient  at the end of the visit.    We discussed the patients current medications. The patient expressed understanding and no barriers to adherence were identified.    Discussed with Dr. Jamal Collin.    Marin Roberts, MD, 04/28/2018

## 2018-04-28 NOTE — Progress Notes (Signed)
Preceptor Note  I personally interviewed and examined this patient, along with Dr. Nelida Gores. I confirm the key elements of the history and physical, and agree with the assessment and plan, as documented in the visit note.  Priscilla Williams is a 47 year old female here for severe HA waking from sleep with unilateral vision loss.  Hx of brain aneurysm.  Benign neuro exam except for vision which patient reports improving.  Concern for atypical migraine vs hypertensive emergency - plan for ED transfer for consideration of meds for BP, HA and head imaging      Driscilla Grammes, MD

## 2018-04-28 NOTE — Progress Notes (Signed)
PER Patient (self), Priscilla Williams is a 47 year old female has requested a refill of         -  fioricet       Last Office Visit: 09/17/17 with GIESMANN, A.  Last Physical Exam: 09/12/15     PAP SMEAR due on 01/22/2018  HPV SCREENING due on 01/22/2018     Other Med Adult:  Most Recent BP Reading(s)  09/17/17 : (!) 114/95        Cholesterol (mg/dL)   Date Value   07/31/2015 225     LOW DENSITY LIPOPROTEIN DIRECT (mg/dL)   Date Value   07/31/2015 125     HIGH DENSITY LIPOPROTEIN (mg/dL)   Date Value   07/31/2015 72     No results found for: TG      THYROID SCREEN TSH REFLEX FT4 (uIU/mL)   Date Value   07/23/2016 2.250         TSH (THYROID STIM HORMONE) (uIU/mL)   Date Value   07/31/2015 1.290       HEMOGLOBIN A1C (%)   Date Value   07/23/2016 5.2       No results found for: POCA1C      No results found for: INR    SODIUM (mmol/L)   Date Value   09/03/2017 139       POTASSIUM (mmol/L)   Date Value   09/03/2017 4.2           CREATININE (mg/dL)   Date Value   09/03/2017 0.6        Documented patient preferred pharmacies:    [id number] - [address], [place] - [address]  Phone: [phone] Fax: [phone]

## 2018-04-30 MED ORDER — BUTALBITAL-APAP-CAFFEINE 50-325-40 MG PO TABS
1.0000 | ORAL_TABLET | Freq: Four times a day (QID) | ORAL | 0 refills | Status: DC | PRN
Start: 2018-04-30 — End: 2019-04-23

## 2018-04-30 MED ORDER — BUTALBITAL-APAP-CAFFEINE 50-325-40 MG PO TABS: 1 | tablet | Freq: Four times a day (QID) | ORAL | 0 refills | 0 days | Status: AC | PRN

## 2018-05-08 DIAGNOSIS — R197 Diarrhea, unspecified: Secondary | ICD-10-CM | POA: Diagnosis not present

## 2018-06-12 DIAGNOSIS — G43109 Migraine with aura, not intractable, without status migrainosus: Secondary | ICD-10-CM | POA: Diagnosis not present

## 2018-06-23 DIAGNOSIS — Z7182 Exercise counseling: Secondary | ICD-10-CM | POA: Diagnosis not present

## 2018-08-11 DIAGNOSIS — K625 Hemorrhage of anus and rectum: Secondary | ICD-10-CM | POA: Diagnosis not present

## 2018-08-11 DIAGNOSIS — R197 Diarrhea, unspecified: Secondary | ICD-10-CM | POA: Diagnosis not present

## 2018-08-18 DIAGNOSIS — G43111 Migraine with aura, intractable, with status migrainosus: Secondary | ICD-10-CM | POA: Diagnosis not present

## 2018-08-18 DIAGNOSIS — Z3002 Counseling and instruction in natural family planning to avoid pregnancy: Secondary | ICD-10-CM | POA: Diagnosis not present

## 2018-08-18 DIAGNOSIS — Z01411 Encounter for gynecological examination (general) (routine) with abnormal findings: Secondary | ICD-10-CM | POA: Diagnosis not present

## 2018-11-10 DIAGNOSIS — R1011 Right upper quadrant pain: Secondary | ICD-10-CM | POA: Diagnosis not present

## 2018-11-10 DIAGNOSIS — R197 Diarrhea, unspecified: Secondary | ICD-10-CM | POA: Diagnosis not present

## 2018-11-18 DIAGNOSIS — R197 Diarrhea, unspecified: Secondary | ICD-10-CM | POA: Diagnosis not present

## 2018-11-18 DIAGNOSIS — R140 Abdominal distension (gaseous): Secondary | ICD-10-CM | POA: Diagnosis not present

## 2019-01-29 DIAGNOSIS — Z1231 Encounter for screening mammogram for malignant neoplasm of breast: Secondary | ICD-10-CM | POA: Diagnosis not present

## 2019-02-19 ENCOUNTER — Ambulatory Visit
Payer: BC Managed Care – PPO | Attending: Student in an Organized Health Care Education/Training Program | Admitting: Physician Assistant

## 2019-02-19 ENCOUNTER — Other Ambulatory Visit: Payer: Self-pay

## 2019-02-19 ENCOUNTER — Ambulatory Visit (HOSPITAL_BASED_OUTPATIENT_CLINIC_OR_DEPARTMENT_OTHER): Payer: Self-pay | Admitting: Ambulatory Care

## 2019-02-19 DIAGNOSIS — J019 Acute sinusitis, unspecified: Secondary | ICD-10-CM | POA: Insufficient documentation

## 2019-02-19 MED ORDER — FLUNISOLIDE 25 MCG/ACT (0.025%) NA SOLN
2.0000 | Freq: Two times a day (BID) | NASAL | 0 refills | Status: DC
Start: 2019-02-19 — End: 2019-04-02

## 2019-02-19 NOTE — Telephone Encounter (Signed)
Regarding: sinus sx  ----- Message from Lars Pinks sent at 02/19/2019 11:55 AM EDT -----  Priscilla Williams [id number], 48 year old, female    Calls today:  Sick    What are the symptoms pt is having sinus symptoms, states a few years ago she had nasal polyps and now she is getting the same symptoms - stuffy nose/unable to blow her nose and clogged ears. No televisits available, pt requesting call from clinical team to discuss.  How long has patient been sick?   What has pt. tried at home   Person calling on behalf of patient: Patient (self)    Rod Can NUMBER: [phone]    Patient's language of care: English    Patient's PCP: Lysle Dingwall, MD

## 2019-02-19 NOTE — Telephone Encounter (Signed)
Called and spoke to patient.    Has been having sinus issues.  Nose congested and can't get anything out.  Painful at times.      Tele visit made for today.

## 2019-02-19 NOTE — Progress Notes (Signed)
[name] scheduled from resource schedule for urgent care, patient understands that this is to address urgent complaint and will schedule with regular team at another date for any chronic conditions or ongoing issues      CC:    Priscilla Williams is a 48 year old female presenting for   Sinus issues    HPI    Sinus issues: has been over a week, worsening  - L side worse than R  - feels very congested but rhinorrhea is clear  - many years ago had a nasal polyp and needed to use a spray and isn't sure if it is that  - no sinus pain/HA but if blows a lot hurts a little - no sore throat, no cough, no dizziness/headaches now but yesterday after sitting and blowing nose when got up felt off balance  - no ear pain  - no fevers/chills/sick contacts    ROS     Patient Active Problem List:     Migraine     Irritable bowel syndrome     Obesity (BMI 30.0-39.9)     Nasal polyps     Osteoarthritis of cervical spine with myelopathy     Hepatic steatosis     Asymptomatic microscopic hematuria     Brain aneurysm       Review of patient's family history indicates:  Problem: Heart      Relation: Father          Age of Onset: (Not Specified)  Problem: Heart      Relation: Brother          Age of Onset: (Not Specified)  Problem: Stroke      Relation: Father          Age of Onset: (Not Specified)  Problem: Diabetes      Relation: Maternal Grandmother          Age of Onset: (Not Specified)  Problem: Cancer - Lung      Relation: Mother          Age of Onset: (Not Specified)  Problem: Cancer - Other      Relation: Mother          Age of Onset: (Not Specified)          Comment: bone  Problem: Cancer - Breast      Relation: Maternal Grandmother          Age of Onset: (Not Specified)          Comment: post menopausal  Problem: Cancer - Prostate      Relation: Maternal Grandmother          Age of Onset: (Not Specified)          Comment: post menopausal  Problem: Hypertension      Relation: Mother          Age of Onset: (Not  Specified)  Problem: Hypertension      Relation: Father          Age of Onset: (Not Specified)  Problem: Lipids      Relation: Father          Age of Onset: (Not Specified)  Problem: Thyroid      Relation: Sister          Age of Onset: (Not Specified)          Comment: 2 sisters 1 with hypothyroid, one hyperthyroid         Current Outpatient Medications on File  Prior to Visit:  butalbital-acetaminophen-caffeine (FIORICET, ESGIC) per tablet Take 1 tablet by mouth every 6 (six) hours as needed for Pain Disp: 12 tablet Rfl: 0   Butalbital-APAP-Caffeine (FIORICET) 50-300-40 MG CAPS Take 1 capsule by mouth Disp:  Rfl:    doxepin (SINEQUAN) 10 MG capsule TAKE 1 CAPSULE BY MOUTH ONCE DAILY Disp: 90 capsule Rfl: 3   norethindrone-ethinyl estradiol (LOESTRIN 1.5/30, 21,) 1.5-30 MG-MCG tablet Take 1 tablet by mouth daily. Disp:  Rfl:      No current facility-administered medications on file prior to visit.      Review of Patient's Allergies indicates:  No Known Allergies     Social History    Tobacco Use      Smoking status: Never Smoker      Smokeless tobacco: Never Used    Alcohol use: Yes      Comment: 1 drink every 6 months, wine    Drug use: No    Social History    Social History Narrative      Lives with husband and 4 children.  Work for city [employer], works in parking.  Used to Nurse, mental health.  Likes to spend time with family and friends, go to [employer], local youth organizations.       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  More pasta, carbs.  Hard to lose weight.  Work offers free [employer] - would like to find time.            3, 18, 16, 11 y Lovena Le, [place], [name])        [place] [place] [employer] [place] work        Physical exam    Exam - vitals deferred in setting of  COVID pandemic  Speaking in complete sentences, breathing comfortably     Assessment/Plan:  (J01.90) Acute sinusitis, recurrence not specified, unspecified location  Comment:   Plan:   Bacterial vs viral sinusitis vs AR with polyp (pt had 20 years ago). Trial nasal steroid- sent today- and referred to [facility] for evaluation of sinnuses, consider abx if worsening/TTP sinuses  F/u sooner prn    We discussed the patients current medications. The patient expressed understanding and no barriers to adherence were identified.   1. The patient indicates understanding of these issues and agrees with the plan. Brief care plan is updated and reviewed with the patient.   2. The patient is given an After Visit Summary sheet that lists all medications with directions, allergies, orders placed during this encounter, and follow-up instructions.   3. I reviewed the patient's medical information and medical history   4. I reconciled the patient's medication list and prepared and supplied needed refills.   5. I have reviewed the past medical, family, and social history sections including the medications and allergies.    Priscilla Williams Priscilla Hertz, PA-C, 02/19/2019

## 2019-02-20 ENCOUNTER — Ambulatory Visit: Payer: BC Managed Care – PPO | Attending: Internal Medicine | Admitting: Family Medicine

## 2019-02-20 ENCOUNTER — Other Ambulatory Visit: Payer: Self-pay

## 2019-02-20 VITALS — HR 80 | Temp 96.7°F

## 2019-02-20 DIAGNOSIS — Z20822 Contact with and (suspected) exposure to covid-19: Secondary | ICD-10-CM

## 2019-02-20 DIAGNOSIS — J3489 Other specified disorders of nose and nasal sinuses: Secondary | ICD-10-CM | POA: Diagnosis not present

## 2019-02-20 DIAGNOSIS — R0981 Nasal congestion: Secondary | ICD-10-CM

## 2019-02-20 DIAGNOSIS — Z20828 Contact with and (suspected) exposure to other viral communicable diseases: Secondary | ICD-10-CM | POA: Diagnosis present

## 2019-02-20 LAB — COVID-19 OUTPATIENT: COVID-19 OUTPATIENT: NEGATIVE

## 2019-02-20 NOTE — Progress Notes (Signed)
Acute Care Clinic Note    Subjective  Priscilla Williams 48 year old English-speaking female who presents for evaluation in acute care clinic.    HPI  Date of symptom onset:    Date of dyspnea onset:       - 74F with obesity, migraines   - Presenting for nasal congestion and sinus discomfort L>R x 1 week   - Noticed tenderness over L sinus x few days, mild   - Experiencing clear rhinorrhea   - No purulent nasal discharge   - No fevers, chills, ear pain, sore throat, cough, SOB, chest pain, abd pain, diarrhea, anosmia   - No recent sick contacts   - No recent travel   - Has tried OTC nasal spray without relief   - Was sent rx for intranasal steroid but has not yet picked this up  - Remote history of nasal polyps     ROS  No abdominal pain, nausea, vomiting, diarrhea. No lower extremity swelling. No loss of smell. No new rashes or skin changes.     Medical risks for COVID complications:  Obesity    Per chart review prior to visit:  Social History    Tobacco Use      Smoking status: Never Smoker      Smokeless tobacco: Never Used    Social History    Social History Narrative      Lives with husband and 4 children.  Work for city [employer], works in parking.  Used to Nurse, mental health.  Likes to spend time with family and friends, go to [employer], local youth organizations.       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  More pasta, carbs.  Hard to lose weight.  Work offers free [employer] - would like to find time.            3, 19, 17, 11 y Lovena Le, [place], [name])        [place] [place] [employer] [place] work    Patient Active Problem List:     Migraine     Irritable bowel syndrome     Obesity (BMI 30.0-39.9)     Nasal polyps     Osteoarthritis of cervical spine with myelopathy      Hepatic steatosis     Asymptomatic microscopic hematuria     Brain aneurysm    Review of Patient's Allergies indicates:  No Known Allergies    Objective  Pulse 80    Temp 96.7 F (35.9 C) (Temporal)    SpO2 97%        Most Recent O2 Sat Reading(s)  02/20/19 : 97%  04/28/18 : 98%  09/17/17 : 100%    Gen: No respiratory distress  HEENT: NCAT, EOMI, conjunctiva clear, auditory canal normal, TMs grey with normal light reflex, hearing intact to voice, nasal septum midline, turbinates edematous bilaterally, no clearly visualized nasal obstruction, no purulent nasal discharge, teeth present with good dentition, moist mucous membranes, oropharynx clear. +Mild TTP over L maxillary sinus. No increased pain/pressure of sinuses with leaning forward.   CV: RRR, no M/R/G  Pulm: No rhonchi, no wheezing, no bibasilar rales  Skin: good skin turgor, not diaphoretic  Neuro: alert and oriented to time, person and place  Psych: normal affect. Normal thought content, speech, mood are noted.    Assessment/Plan    1. Nasal congestion  2. Tenderness over frontal sinus  3. Suspected COVID-19 virus infection  - Ddx includes viral vs bacterial rhinosinusitis, not clearly consistent with bacterial sinusitis given afebrile, no purulent discharge, only mild discomfort with palpation  - Discussed option to watch/wait and see if sx improve vs send rx for antibiotics for bacterial sinusitis to start taking if sx worsen  - Pt prefers to hold off on rx at this time but will call back if sx worsening, can send abx to pharmacy at that time   - Advised trial of intranasal steroid   - Rest, increased hydration, NSAIDs/acetaminophen prn  - Tested for COVID-19 today   - Call back if worsening or not improving for further evaluation    - COVID-19 OUTPATIENT    Disposition  Dispo to home: Home isolation instructions and symptom precaution printed in AVS and reviewed with patient.   F/u with PCP prn.     Laymond Purser, PA-C    CC to PCP: Lysle Dingwall, MD

## 2019-02-22 ENCOUNTER — Encounter (HOSPITAL_BASED_OUTPATIENT_CLINIC_OR_DEPARTMENT_OTHER): Payer: Self-pay | Admitting: Student in an Organized Health Care Education/Training Program

## 2019-03-15 ENCOUNTER — Other Ambulatory Visit (HOSPITAL_BASED_OUTPATIENT_CLINIC_OR_DEPARTMENT_OTHER): Payer: Self-pay | Admitting: Student in an Organized Health Care Education/Training Program

## 2019-03-15 DIAGNOSIS — K580 Irritable bowel syndrome with diarrhea: Secondary | ICD-10-CM

## 2019-03-15 NOTE — Progress Notes (Signed)
PER Patient (self), Priscilla Williams is a 48 year old female has requested a refill of doxepin.      Last Office Visit: 02/20/2019 with sandeep,n  Last Physical Exam: 09/12/2015    PAP SMEAR due on 01/22/2018  HPV SCREENING due on 01/22/2018    Other Med Adult:  Most Recent BP Reading(s)  04/28/18 : (!) 181/92        Cholesterol (mg/dL)   Date Value   07/31/2015 225     LOW DENSITY LIPOPROTEIN DIRECT (mg/dL)   Date Value   07/31/2015 125     HIGH DENSITY LIPOPROTEIN (mg/dL)   Date Value   07/31/2015 72     No results found for: TG      THYROID SCREEN TSH REFLEX FT4 (uIU/mL)   Date Value   07/23/2016 2.250         TSH (THYROID STIM HORMONE) (uIU/mL)   Date Value   07/31/2015 1.290       HEMOGLOBIN A1C (%)   Date Value   07/23/2016 5.2       No results found for: POCA1C      No results found for: INR    SODIUM (mmol/L)   Date Value   09/03/2017 139       POTASSIUM (mmol/L)   Date Value   09/03/2017 4.2           CREATININE (mg/dL)   Date Value   09/03/2017 0.6       Documented patient preferred pharmacies:    [id number] - [address], [place] - [address]  Phone: [phone] Fax: [phone]

## 2019-03-16 DIAGNOSIS — Z1159 Encounter for screening for other viral diseases: Secondary | ICD-10-CM | POA: Diagnosis not present

## 2019-03-19 ENCOUNTER — Other Ambulatory Visit (HOSPITAL_BASED_OUTPATIENT_CLINIC_OR_DEPARTMENT_OTHER): Payer: Self-pay | Admitting: Student in an Organized Health Care Education/Training Program

## 2019-03-19 DIAGNOSIS — K529 Noninfective gastroenteritis and colitis, unspecified: Secondary | ICD-10-CM | POA: Diagnosis not present

## 2019-03-19 DIAGNOSIS — K5790 Diverticulosis of intestine, part unspecified, without perforation or abscess without bleeding: Secondary | ICD-10-CM | POA: Diagnosis not present

## 2019-03-19 DIAGNOSIS — Z1211 Encounter for screening for malignant neoplasm of colon: Secondary | ICD-10-CM | POA: Diagnosis not present

## 2019-03-19 DIAGNOSIS — K580 Irritable bowel syndrome with diarrhea: Secondary | ICD-10-CM

## 2019-03-19 DIAGNOSIS — K5730 Diverticulosis of large intestine without perforation or abscess without bleeding: Secondary | ICD-10-CM | POA: Diagnosis not present

## 2019-03-19 NOTE — Progress Notes (Unsigned)
The patient has no doxepin left, if she needs a televisit to get the full script. Is it possible for her to get a bridge supply?

## 2019-03-22 ENCOUNTER — Telehealth (HOSPITAL_BASED_OUTPATIENT_CLINIC_OR_DEPARTMENT_OTHER): Payer: Self-pay | Admitting: Family Medicine

## 2019-03-22 ENCOUNTER — Ambulatory Visit: Payer: BC Managed Care – PPO | Attending: Family Medicine | Admitting: Family Medicine

## 2019-03-22 ENCOUNTER — Other Ambulatory Visit: Payer: Self-pay

## 2019-03-22 DIAGNOSIS — K580 Irritable bowel syndrome with diarrhea: Secondary | ICD-10-CM | POA: Insufficient documentation

## 2019-03-22 DIAGNOSIS — I10 Essential (primary) hypertension: Secondary | ICD-10-CM | POA: Insufficient documentation

## 2019-03-22 DIAGNOSIS — R42 Dizziness and giddiness: Secondary | ICD-10-CM | POA: Diagnosis not present

## 2019-03-22 MED ORDER — DOXEPIN HCL 10 MG PO CAPS
10.0000 mg | ORAL_CAPSULE | Freq: Every day | ORAL | 3 refills | Status: DC
Start: 2019-03-22 — End: 2019-04-30

## 2019-03-22 NOTE — Progress Notes (Signed)
CC: dizziness, nausea, palpitations, high blood pressure.     Priscilla Williams is a 48 year old female presenting for     Reports had colonoscopy on Friday 9/25 at [facility] (report shows benign result), and since then has had vertigo, mild nausea, occasional palpitations, anxiety.  C/o "full blown panic attack" Friday evening.  Ran out of doxepin a week ago (she uses this for her irritable bowel syndrome), and wonders if she's having a withdrawal reaction from it.    Has had elevated BP occasionally in the past, including 180/90 last November here, but has not been taking blood pressure meds.  Reports BP was 160-170 on Friday and they almost canceled her colonoscopy, but ended up completing it and telling her to follow up with her PCP.  Has checked it at home this weekend and today with husband's BP cuff, and has had BP 130-150/95-105, with heart rate 90-105.    Worries that she could have a heart attack or a stroke because she has a strong family history of early CAD, and a personal history of a brain aneurysm (followed by [facility] neurology for this and for her migraines).  No lateralizing weakness/numbness, no dysarthria/confusion/vision change.  No dyspnea or chest pressure.  Does report left axillary sharp pain since colonoscopy that is mild and does not seem to change with exertion.     ROS     Patient Active Problem List:     Migraine     Irritable bowel syndrome     Obesity (BMI 30.0-39.9)     Nasal polyps     Osteoarthritis of cervical spine with myelopathy     Hepatic steatosis     Asymptomatic microscopic hematuria     Brain aneurysm     Essential hypertension       Review of patient's family history indicates:  Problem: Heart      Relation: Father          Age of Onset: (Not Specified)  Problem: Heart      Relation: Brother          Age of Onset: (Not Specified)  Problem: Stroke      Relation: Father          Age of Onset: (Not Specified)  Problem: Diabetes      Relation: Maternal Grandmother          Age of  Onset: (Not Specified)  Problem: Cancer - Lung      Relation: Mother          Age of Onset: (Not Specified)  Problem: Cancer - Other      Relation: Mother          Age of Onset: (Not Specified)          Comment: bone  Problem: Cancer - Breast      Relation: Maternal Grandmother          Age of Onset: (Not Specified)          Comment: post menopausal  Problem: Cancer - Prostate      Relation: Maternal Grandmother          Age of Onset: (Not Specified)          Comment: post menopausal  Problem: Hypertension      Relation: Mother          Age of Onset: (Not Specified)  Problem: Hypertension      Relation: Father          Age  of Onset: (Not Specified)  Problem: Lipids      Relation: Father          Age of Onset: (Not Specified)  Problem: Thyroid      Relation: Sister          Age of Onset: (Not Specified)          Comment: 2 sisters 1 with hypothyroid, one hyperthyroid       [EXPIRED] Flunisolide 25 MCG/ACT (0.025%) SOLN nasal spray, 2 sprays by Each Nostril route 2 (two) times daily, Disp: 1 Bottle, Rfl: 0  butalbital-acetaminophen-caffeine (FIORICET, ESGIC) per tablet, Take 1 tablet by mouth every 6 (six) hours as needed for Pain, Disp: 12 tablet, Rfl: 0  Butalbital-APAP-Caffeine (FIORICET) 50-300-40 MG CAPS, Take 1 capsule by mouth, Disp: , Rfl:   [DISCONTINUED] doxepin (SINEQUAN) 10 MG capsule, TAKE 1 CAPSULE BY MOUTH ONCE DAILY, Disp: 90 capsule, Rfl: 3  norethindrone-ethinyl estradiol (LOESTRIN 1.5/30, 21,) 1.5-30 MG-MCG tablet, Take 1 tablet by mouth daily., Disp: , Rfl:     No current facility-administered medications on file prior to visit.        Review of Patient's Allergies indicates:  No Known Allergies     Social History    Tobacco Use      Smoking status: Never Smoker      Smokeless tobacco: Never Used    Alcohol use: Yes      Comment: 1 drink every 6 months, wine    Drug use: No    Social History    Social History Narrative      Lives with husband and 4 children.  Work for city [employer], works in  parking.  Used to Nurse, mental health.  Likes to spend time with family and friends, go to [employer], local youth organizations.       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  More pasta, carbs.  Hard to lose weight.  Work offers free [employer] - would like to find time.            3, 19, 17, 10 y Lovena Le, [place], [name])        [place] work         Exam - vitals deferred in setting of COVID pandemic  Speaking in complete sentences, breathing comfortably       Assessment/Plan:  1. Essential hypertension  Consistently elevated blood pressures and heart rate, needs in person eval urgently.  May be exacerbated by anxiety, but needs cardiac exam/ekg, possibly serum labs as well.  Reviewed red flag symptoms, and she agrees to call 911 or go to ED if worsening/new features, or if SBP>160 or DBP >110.  Referred urgently to [facility] (nausea does not seem c/w covid, and had neg test on 9/24, but per our guidelines should not be seen at [facility] with this.)    2. Irritable bowel syndrome with diarrhea  Refilled doxepin and asked her to start taking it today.  May have some antianxiety effect as well.  - doxepin (SINEQUAN) 10 MG capsule; Take 1 capsule by mouth daily  Dispense: 90 capsule; Refill: 3    3. Vertigo  Mild,  unclear precipitant.  Denies presyncope -- clearly describes "room moving when I close my eyes in bed".  Possibly doxepin will help some, but if worsens then she should be evaluated in person at once.        Screening for possible COVID:    1. In the past 14 days, has the patient had new or worsening (for chronic symptoms) fever, cough, shortness of breath, body aches, runny nose, sore throat, nausea, diarrhea, or lack of smell/taste? yes - nausea with vertigo,  had negative COVID test on 9/24  2. In the past 14 days, has the patient had COVID or suspected COVID?  No  Priscilla Williams  [id number]    Please schedule this patient for an in-person visit at [facility] ([facility]) -- required if positive COVID screen, for urgent appointments please call [phone]    If requesting a visit at a regional care clinic, I have screened the patient for COVID using COVIDSCREENING in my note and the patient has answered "No" to questions 1 and 2: N/A    Reason for visit: High blood pressure, palpitations, vertigo  Schedule with: Any provider  Time frame: Within 48 hours  If requested appointment not available within the requested time frame: Schedule with alternate provider or at alternate location per patient preference    ** Providers should route this request to their home clinic front desk **

## 2019-03-22 NOTE — Progress Notes (Signed)
Returned call to pt and reached voicemail. Left message on machine for call back.       Message from [place] sent at 03/22/2019 9:03 AM EDT     Summary: dizziness     Priscilla Williams [id number], 48 year old, female     Calls today: Sick    What are the symptoms high bp 170/121 (Friday) 150/105 (this morning), dizziness, nausea - FYI - pt is working until 1p and wanted an appt this afternoon, she is scheduled with [employer].   Person calling on behalf of patient: Patient (self)     Patient's language of care: English     Patient does not need an interpreter.     Patient's PCP: Lysle Dingwall, MD

## 2019-03-26 ENCOUNTER — Ambulatory Visit: Payer: BC Managed Care – PPO | Attending: Internal Medicine | Admitting: Family Medicine

## 2019-03-26 ENCOUNTER — Other Ambulatory Visit: Payer: Self-pay

## 2019-03-26 ENCOUNTER — Encounter (HOSPITAL_BASED_OUTPATIENT_CLINIC_OR_DEPARTMENT_OTHER): Payer: Self-pay | Admitting: Family Medicine

## 2019-03-26 VITALS — BP 146/96 | HR 83 | Temp 97.9°F

## 2019-03-26 DIAGNOSIS — Z13220 Encounter for screening for lipoid disorders: Secondary | ICD-10-CM

## 2019-03-26 DIAGNOSIS — I10 Essential (primary) hypertension: Secondary | ICD-10-CM | POA: Insufficient documentation

## 2019-03-26 DIAGNOSIS — Z131 Encounter for screening for diabetes mellitus: Secondary | ICD-10-CM | POA: Insufficient documentation

## 2019-03-26 DIAGNOSIS — F411 Generalized anxiety disorder: Secondary | ICD-10-CM | POA: Insufficient documentation

## 2019-03-26 DIAGNOSIS — R42 Dizziness and giddiness: Secondary | ICD-10-CM | POA: Diagnosis not present

## 2019-03-26 LAB — BASIC METABOLIC PANEL
ANION GAP: 4 mmol/L — ABNORMAL LOW (ref 5–15)
BUN (UREA NITROGEN): 14 mg/dL (ref 7–18)
CALCIUM: 9 mg/dL (ref 8.5–10.1)
CARBON DIOXIDE: 30 mmol/L (ref 21–32)
CHLORIDE: 106 mmol/L (ref 98–107)
CREATININE: 0.6 mg/dL (ref 0.4–1.2)
ESTIMATED GLOMERULAR FILT RATE: >60 mL/min (ref >60)
Glucose Random: 96 mg/dL (ref 74–160)
POTASSIUM: 4.5 mmol/L (ref 3.5–5.1)
SODIUM: 140 mmol/L (ref 136–145)

## 2019-03-26 LAB — LIPID PANEL
Cholesterol: 207 mg/dL (ref 0–239)
HIGH DENSITY LIPOPROTEIN: 75 mg/dL (ref 40–?)
LOW DENSITY LIPOPROTEIN DIRECT: 110 mg/dL (ref 0–189)
TRIGLYCERIDES: 83 mg/dL (ref 0–150)

## 2019-03-26 LAB — CBC, PLATELET & DIFFERENTIAL
ABSOLUTE BASO COUNT: 0 10*3/uL (ref 0.0–0.1)
ABSOLUTE EOSINOPHIL COUNT: 0.1 10*3/uL (ref 0.0–0.8)
ABSOLUTE IMM GRAN COUNT: 0.03 10*3/uL (ref 0.00–0.03)
ABSOLUTE LYMPH COUNT: 1.9 10*3/uL (ref 0.6–5.9)
ABSOLUTE MONO COUNT: 0.5 10*3/uL (ref 0.2–1.4)
ABSOLUTE NEUTROPHIL COUNT: 4.9 10*3/uL (ref 1.6–8.3)
ABSOLUTE NRBC COUNT: 0 10*3/uL (ref 0.0–0.0)
BASOPHIL %: 0.4 % (ref 0.0–1.2)
EOSINOPHIL %: 0.9 % (ref 0.0–7.0)
HEMATOCRIT: 42.8 % (ref 34.1–44.9)
HEMOGLOBIN: 13.6 g/dL (ref 11.2–15.7)
IMMATURE GRANULOCYTE %: 0.4 % (ref 0.0–0.4)
LYMPHOCYTE %: 25.8 % (ref 15.0–54.0)
MEAN CORP HGB CONC: 31.8 g/dL (ref 31.0–37.0)
MEAN CORPUSCULAR HGB: 27.3 pg (ref 26.0–34.0)
MEAN CORPUSCULAR VOL: 85.9 fl (ref 80.0–100.0)
MEAN PLATELET VOLUME: 11 fL (ref 8.7–12.5)
MONOCYTE %: 7 % (ref 4.0–13.0)
NEUTROPHIL %: 65.5 % (ref 40.0–75.0)
NRBC %: 0 % (ref 0.0–0.0)
PLATELET COUNT: 294 10*3/uL (ref 150–400)
RBC DISTRIBUTION WIDTH STD DEV: 42.3 fL (ref 35.1–46.3)
RED BLOOD CELL COUNT: 4.98 M/uL (ref 3.90–5.20)
WHITE BLOOD CELL COUNT: 7.5 10*3/uL (ref 4.0–11.0)

## 2019-03-26 LAB — THYROID SCREEN TSH REFLEX FT4: THYROID SCREEN TSH REFLEX FT4: 0.786 u[IU]/mL (ref 0.358–3.740)

## 2019-03-26 LAB — VITAMIN D,25 HYDROXY: VITAMIN D,25 HYDROXY: 19 ng/mL — CL (ref 30.0–100.0)

## 2019-03-26 LAB — BMP (EXT)
Anion Gap (EXT): 4 mmol/L — ABNORMAL LOW (ref 5–15)
BUN (EXT): 14 mg/dL (ref 7–18)
CO2 (EXT): 30 mmol/L (ref 21–32)
CalciumCalcium (EXT): 9 mg/dL (ref 8.5–10.1)
Chloride (EXT): 106 mmol/L (ref 98–107)
Creatinine (EXT): 0.6 mg/dL (ref 0.4–1.2)
Glucose (EXT): 96 mg/dL (ref 74–160)
Potassium (EXT): 4.5 mmol/L (ref 3.5–5.1)
Sodium (EXT): 140 mmol/L (ref 136–145)
eGFR - Creat CKD-EPI (EXT): >60 mL/min (ref >60)

## 2019-03-26 LAB — LIPID PROFILE (EXT)
Cholesterol (EXT): 207 mg/dL (ref 0–239)
HDL Cholesterol (EXT): 75 mg/dL (ref 40–?)
LDL Cholesterol (EXT): 110 mg/dL (ref 0–189)
Triglycerides (EXT): 83 mg/dL (ref 0–150)

## 2019-03-26 MED ORDER — LISINOPRIL 5 MG PO TABS
5.0000 mg | ORAL_TABLET | Freq: Every day | ORAL | 0 refills | Status: DC
Start: 2019-03-26 — End: 2019-04-09

## 2019-03-26 NOTE — Progress Notes (Signed)
Acute Care Clinic Note    Subjective  Priscilla Williams 48 year old English-speaking female who presents for evaluation in acute care clinic.    HPI  Date of symptom onset:    Date of dyspnea onset:       - 84F with h/p obesity, HTN, migraines    # Dizziness   - Had televisit on 9/28 for episodes of dizziness, nausea, palpitations, anxiety since her colonoscopy  - Intermittent dizziness for the past week, described feeling lightheaded and sometimes like something moving when her eyes were closed     - Dizziness not worsened with positional changes, with turning her head    - Occasionally feels associated palpitations   - Nausea improved over past few days, thinks nausea was related to her colonoscopy   - Sx last for a few minutes at a time   - Has had increased stress recently, job concerns   - No chest pain, HA, blurry vision, gait changes     - No exertional chest pain, dyspnea    - Had a panic attack last week which felt different from her episodes of dizziness/palpitations   - No fevers, cough, SOB, abd pain, V/D, anosmia   - No recent known sick contacts  - No head trauma/injury     # HTN   - Has never been prescribed a medication for her BP, was high at [facility] recently at colonoscopy appt to 160s/120s, also checks her BP at home with husband's cuff and usually 150s/90s    - Has been to emergency room in the past for hypertensive emergency   - Has had BP checked recently at work 154/96  - Has been seen in ED for hypertensive urgency   - Brother passed away at 65yo from ?cardiac disease  - Father with MI at 29      # Migraines  # L MCA aneurysm   - F/b neurology at [facility]  - No HA currently     ROS  No abdominal pain, vomiting, diarrhea. No lower extremity swelling. No loss of smell. No new rashes or skin changes.     Medical risks for COVID complications:  HTN, obestiy      Per chart review prior to visit:  Social History    Tobacco Use      Smoking status: Never Smoker      Smokeless tobacco: Never Used    Social  History    Social History Narrative      Lives with husband and 4 children.  Work for city [employer], works in parking.  Used to Nurse, mental health.  Likes to spend time with family and friends, go to [employer], local youth organizations.       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  More pasta, carbs.  Hard to lose weight.  Work offers free [employer] - would like to find time.            3, 19, 17, 11 y Lovena Le, [place], [name])        [place] [employer] [place] work    Patient Active Problem List:     Migraine  Irritable bowel syndrome     Obesity (BMI 30.0-39.9)     Nasal polyps     Osteoarthritis of cervical spine with myelopathy     Hepatic steatosis     Asymptomatic microscopic hematuria     Brain aneurysm     Essential hypertension    Review of Patient's Allergies indicates:  No Known Allergies    Objective  Pulse 83    Temp 97.9 F (36.6 C) (Temporal)    SpO2 96%      Most Recent O2 Sat Reading(s)  03/26/19 : 96%  02/20/19 : 97%  04/28/18 : 98%    General: Well-appearing in NAD.  Skin: Warm, dry.   HEENT: NCAT, EOMI, PERRL, conjunctiva clear, hearing intact to voice, moist mucous membranes.   Neck: Full ROM of neck, no thyromegaly, no anterior cervical, posterior cervical, submandibular, supraclavicular LAD.   CV: RRR, no M/R/G  Pulm: CTA bilaterally. No rhonchi, no wheezing, no bibasilar rales  Skin: good skin turgor, not diaphoretic  Extrem: Full ROM of all extremities, no cyanosis, clubbing.  Neuro: Awake, AAOx3 to person, place, time. CN II-XII grossly intact. Sensation to light touch intact in all extremities. Normal muscle tone and strength 5/5 in all extremities. Biceps, BR, patellar, Achilles reflexes 2+ bilaterally. Gait and balance normal. Romberg  negative. Cerebellar: FTN, rapid alternating movements intact without dysmetria. Tandem walking normal. Dix-Hallpike neg bilaterally.   Psych: Euthymic.     Assessment/Plan    1. Dizziness  - Neuro exam without focal abnormalities reassuring, less likely intracranial pathology   - BP slightly elevated today but not within range of hypertensive urgency/emergency     - Nl EKG per my read, await cardiology final read    - Check CBC, TSH, BMP today   - Counseled re: stress management, discussed sx not clearly consistent with BPPV but can try Epley maneuver to see if sx are improving    - Call back or present to ED if any new/worsening dizziness, HA, vision changes, chest pain, SOB   - EKG  - CBC, PLATELET & DIFFERENTIAL  - THYROID SCREEN TSH REFLEX FT4  - BASIC METABOLIC PANEL  - VITAMIN D,25 HYDROXY    2. Essential hypertension  - BP elevated with history of severe elevations in the past year, with BP as high as [id number] systolic, 182U diastolic at previous visits, not currently on antihypertensive  - Given consistently elevated BP, will initiate lisinopril 44m today, discussed goal BP <140/<90   [facility]   - Counseled extensively re: dietary and lifestyle recommendations for HTN including DASH diet, increased physical activity  - Precautions reviewed, discussed red flag sx including HA, dizziness, vision changes and when to call/present to ED  - lisinopril (ZESTRIL) 5 MG tablet; Take 1 tablet by mouth daily  Dispense: 30 tablet; Refill: 0    3. Screening for diabetes mellitus (DM)  - HEMOGLOBIN A1C    4. Screening cholesterol level  - LIPID PANEL    5. Anxiety state  - Denis SI/HI  - Counseled re: stress management  [facility]   Disposition  Follow up in 14 day(s) via respiratory clinic; chart routed to appropriate team/pool.     NLaymond Purser PA-C    CC to PCP: ALysle Dingwall MD

## 2019-03-26 NOTE — Patient Instructions (Signed)
Patient Education     Hypertension, Adult  High blood pressure (hypertension) is when the force of blood pumping through the arteries is too strong. The arteries are the blood vessels that carry blood from the heart throughout the body. Hypertension forces the heart to work harder to pump blood and may cause arteries to become narrow or stiff. Untreated or uncontrolled hypertension can cause a heart attack, heart failure, a stroke, kidney disease, and other problems.  A blood pressure reading consists of a higher number over a lower number. Ideally, your blood pressure should be below 120/80. The first ("top") number is called the systolic pressure. It is a measure of the pressure in your arteries as your heart beats. The second ("bottom") number is called the diastolic pressure. It is a measure of the pressure in your arteries as the heart relaxes.  What are the causes?  The exact cause of this condition is not known. There are some conditions that result in or are related to high blood pressure.  What increases the risk?  Some risk factors for high blood pressure are under your control. The following factors may make you more likely to develop this condition:   Smoking.   Having type 2 diabetes mellitus, high cholesterol, or both.   Not getting enough exercise or physical activity.   Being overweight.   Having too much fat, sugar, calories, or salt (sodium) in your diet.   Drinking too much alcohol.  Some risk factors for high blood pressure may be difficult or impossible to change. Some of these factors include:   Having chronic kidney disease.   Having a family history of high blood pressure.   Age. Risk increases with age.   Race. You may be at higher risk if you are African American.   Gender. Men are at higher risk than women before age 86. After age 40, women are at higher risk than men.   Having obstructive sleep apnea.   Stress.  What are the signs or symptoms?  High blood pressure may not  cause symptoms. Very high blood pressure (hypertensive crisis) may cause:   Headache.   Anxiety.   Shortness of breath.   Nosebleed.   Nausea and vomiting.   Vision changes.   Severe chest pain.   Seizures.  How is this diagnosed?  This condition is diagnosed by measuring your blood pressure while you are seated, with your arm resting on a flat surface, your legs uncrossed, and your feet flat on the floor. The cuff of the blood pressure monitor will be placed directly against the skin of your upper arm at the level of your heart. It should be measured at least twice using the same arm. Certain conditions can cause a difference in blood pressure between your right and left arms.  Certain factors can cause blood pressure readings to be lower or higher than normal for a short period of time:   When your blood pressure is higher when you are in a health care provider's office than when you are at home, this is called white coat hypertension. Most people with this condition do not need medicines.   When your blood pressure is higher at home than when you are in a health care provider's office, this is called masked hypertension. Most people with this condition may need medicines to control blood pressure.  If you have a high blood pressure reading during one visit or you have normal blood pressure with other risk factors,  you may be asked to:   Return on a different day to have your blood pressure checked again.   Monitor your blood pressure at home for 1 week or longer.  If you are diagnosed with hypertension, you may have other blood or imaging tests to help your health care provider understand your overall risk for other conditions.  How is this treated?  This condition is treated by making healthy lifestyle changes, such as eating healthy foods, exercising more, and reducing your alcohol intake. Your health care provider may prescribe medicine if lifestyle changes are not enough to get your blood pressure  under control, and if:   Your systolic blood pressure is above 130.   Your diastolic blood pressure is above 80.  Your personal target blood pressure may vary depending on your medical conditions, your age, and other factors.  Follow these instructions at home:  Eating and drinking     Eat a diet that is high in fiber and potassium, and low in sodium, added sugar, and fat. An example eating plan is called the DASH (Dietary Approaches to Stop Hypertension) diet. To eat this way:  ? Eat plenty of fresh fruits and vegetables. Try to fill one half of your plate at each meal with fruits and vegetables.  ? Eat whole grains, such as whole-wheat pasta, brown rice, or whole-grain bread. Fill about one fourth of your plate with whole grains.  ? Eat or drink low-fat dairy products, such as skim milk or low-fat yogurt.  ? Avoid fatty cuts of meat, processed or cured meats, and poultry with skin. Fill about one fourth of your plate with lean proteins, such as fish, chicken without skin, beans, eggs, or tofu.  ? Avoid pre-made and processed foods. These tend to be higher in sodium, added sugar, and fat.   Reduce your daily sodium intake. Most people with hypertension should eat less than 1,500 mg of sodium a day.   Do not drink alcohol if:  ? Your health care provider tells you not to drink.  ? You are pregnant, may be pregnant, or are planning to become pregnant.   If you drink alcohol:  ? Limit how much you use to:   0-1 drink a day for women.   0-2 drinks a day for men.  ? Be aware of how much alcohol is in your drink. In the [place] one drink equals one 12 oz bottle of beer (355 mL), one 5 oz glass of wine (148 mL), or one 1 oz glass of hard liquor (44 mL).  Lifestyle     Work with your health care provider to maintain a healthy body weight or to lose weight. Ask what an ideal weight is for you.   Get at least 30 minutes of exercise most days of the week. Activities may include walking, swimming, or  biking.   Include exercise to strengthen your muscles (resistance exercise), such as Pilates or lifting weights, as part of your weekly exercise routine. Try to do these types of exercises for 30 minutes at least 3 days a week.   Do not use any products that contain nicotine or tobacco, such as cigarettes, e-cigarettes, and chewing tobacco. If you need help quitting, ask your health care provider.   Monitor your blood pressure at home as told by your health care provider.   Keep all follow-up visits as told by your health care provider. This is important.  Medicines   Take over-the-counter and prescription medicines only  as told by your health care provider. Follow directions carefully. Blood pressure medicines must be taken as prescribed.   Do not skip doses of blood pressure medicine. Doing this puts you at risk for problems and can make the medicine less effective.   Ask your health care provider about side effects or reactions to medicines that you should watch for.  Contact a health care provider if you:   Think you are having a reaction to a medicine you are taking.   Have headaches that keep coming back (recurring).   Feel dizzy.   Have swelling in your ankles.   Have trouble with your vision.  Get help right away if you:   Develop a severe headache or confusion.   Have unusual weakness or numbness.   Feel faint.   Have severe pain in your chest or abdomen.   Vomit repeatedly.   Have trouble breathing.  Summary   Hypertension is when the force of blood pumping through your arteries is too strong. If this condition is not controlled, it may put you at risk for serious complications.   Your personal target blood pressure may vary depending on your medical conditions, your age, and other factors. For most people, a normal blood pressure is less than 120/80.   Hypertension is treated with lifestyle changes, medicines, or a combination of both. Lifestyle changes include losing weight, eating a  healthy, low-sodium diet, exercising more, and limiting alcohol.  This information is not intended to replace advice given to you by your health care provider. Make sure you discuss any questions you have with your health care provider.  Document Released: 06/10/2005 Document Revised: 02/18/2018 Document Reviewed: 02/18/2018  Elsevier Patient Education  2020 [employer].         Patient Education      Index Spanish   DASH Diet     ________________________________________________________________________  KEY POINTS   The DASH diet may help lower or prevent high blood pressure.   The DASH diet is low in saturated and trans fat, cholesterol, and total fat. It is rich in fruits, vegetables, and low-fat dairy foods.   It helps you choose fewer servings of red meat, sweets, and drinks that contain sugar. It also shows you ways to cut back on the amount of salt in your diet.  ________________________________________________________________________  What is the DASH diet?  DASH stands for dietary approaches to stop hypertension. Hypertension, or high blood pressure, is affected by what you eat. The DASH diet is low in saturated and trans fat, cholesterol, and total fat. It is rich in fruits, vegetables, and low-fat dairy foods. It helps you choose fewer servings of red meat, sweets, and drinks that contain sugar. It also shows you ways to cut back on the amount of salt in your diet.  Following the DASH diet and reducing the amount of salt or sodium in your diet may help lower your blood pressure. It may also help prevent high blood pressure. Some people also need to take medicine to control their blood pressure.  What is hypertension?  Blood pressure is the force of blood against artery walls as the heart pumps blood through the body. Blood pressure can be unhealthy if it is over 120/80. The higher your blood pressure, the greater the health risks. High blood pressure is a problem in many ways.   Your heart must  work harder to pump blood through your body. The added workload on the heart causes thickening of the heart  muscle. Over time, the thickening damages the heart muscle so that it can no longer pump normally. This can lead to a disease called heart failure.   The higher pressure in your arteries may cause them to weaken and bleed, resulting in a stroke.   As you get older, blood vessels may get hard and stiff. Fatty deposits called plaque buildup in blood vessels and make them narrower. The narrowing decreases the amount of blood flow to the body. Small pieces of plaque may break off from the wall of a blood vessel and completely block a smaller blood vessel. This can cause a stroke or a heart attack. Your kidneys and eyes may also be damaged. High blood pressure speeds up this process.  How do I get started?  Your healthcare provider or a dietitian can tell you how many calories a day you need. Most adults need somewhere between 1600 and 2800 calories a day. Check product nutrition labels for serving sizes and the number of calories per serving.  You dont need to buy special foods and there are no hard-to-follow recipes. Start by seeing how DASH compares with what you eat now.  Make changes gradually. Here are some suggestions that might help:   If you now eat 1 or 2 servings of vegetables a day, add a serving at lunch and another at dinner.   Add vegetables into soups, stews, and sauces.   If you have not been eating fruit regularly, or only drink fruit juice, add a serving of fruit to your meals or have it as a snack. Use fruits canned in their own juice or eat more fresh fruits such as apples and bananas.   Drink milk or water with lunch or dinner instead of soda, sugar-sweetened tea, or alcohol. Choose low-fat (1%) or fat-free dairy products so that you eat fewer calories and less fat and cholesterol.   Read food labels on margarines and salad dressings to choose products lowest in fat and sodium.   If  you eat a lot of meat, slowly cut back at each meal. Limit meat to 2 servings per day, with each serving being about 3 to 4 ounces, which is the size of a deck of cards or the palm of your hand.   Have 2 or more meals each week that do not include meat. Increase servings of vegetables, rice, whole grain pasta, and beans in all meals. Try casseroles, pasta, and stir-fry dishes that have less meat and more vegetables, grains, and beans.   Try these snack ideas: unsalted pretzels or nuts mixed with raisins or cranberries, graham crackers, low-fat and fat-free yogurt or frozen yogurt, popcorn with no added salt or butter, or raw vegetables.   Choose whole-grain foods to get more minerals and fiber. For example, choose whole-wheat bread, whole-grain cereals, or brown rice. Although whole grains are a healthy choice, large portions can lead to weight gain. A portion of grain is 1/2 to 1 cup. A cup of food is about the same size as your fist.  The DASH diet can also help you reduce the salt (sodium) in your diet. Try to have no more than 2300 milligrams (mg) of sodium per day. An even lower level of sodium, 1,500 mg, can further reduce blood pressure. Three ways to reduce sodium are:   Eat food products with reduced-sodium or no salt added. Use fresh, frozen, or no-salt-added canned vegetables.   Use less salt when you cook and do not add salt to  your food at the table. Use spices and seasonings such as pepper, garlic, onion, sage, or rosemary to replace salt.   Read food labels. Look for foods that contain less than 5% of the daily value of sodium.  If you need to lose weight as well as lower your blood pressure, replace high-calorie foods with more fruits and vegetables. The DASH eating plan contains many lower-calorie foods such as fruits and vegetables. Here are some ways to cut calories:   Eat a medium apple instead of 4 cookies. You'll save 80 calories.   Eat 1/4 cup of dried apricots instead of 4 ounces of  potato chips. You'll save 500 calories.   Have a hamburger that weighs 3 ounces instead of a quarter pound. Add a 1/2 cup serving of carrots and a 1/2 cup serving of spinach. You'll save more than 200 calories.   Instead of 5 ounces of chicken, have a stir fry with 2 ounces of chicken and 1 and 1/2 cups of raw vegetables. Use just a small amount of vegetable oil. You'll save 50 calories.   Have a 1/2 cup serving of low-fat frozen yogurt instead of a 1-and-1/2-ounce chocolate bar. You'll save about 110 calories.   Use low-fat or fat-free salad dressings.   Eat smaller portions. Cut back slowly.   Use food labels to compare fat and calorie content in packaged foods. Items marked low-fat or fat-free may be lower in fat but not lower in calories than their regular versions.   Try to avoid foods with refined grain products such as white rice and white flour. Eat whole grains instead. Also try to avoid foods with added sugars such as sucrose, glucose, dextrose, high-fructose corn syrup, corn sweetener, honey, and brown sugar. Don't drink a lot of juice or soda. Drink water or club soda instead.  For more information, contact:   Your Guide to Lowering your Blood Pressure with DASH  [email or web address]  Developed by [employer].  Adult Advisor 2019.4 published by [employer].  Last modified: 2017-04-10  Last reviewed: 2017-04-30  This content is reviewed periodically and is subject to change as new health information becomes available. The information is intended to inform and educate and is not a replacement for medical evaluation, advice, diagnosis or treatment by a healthcare professional.  References   Adult Advisor 2019.4 Index     2018 [employer] and/or one of its subsidiaries

## 2019-03-27 ENCOUNTER — Encounter (HOSPITAL_BASED_OUTPATIENT_CLINIC_OR_DEPARTMENT_OTHER): Payer: Self-pay | Admitting: Family Medicine

## 2019-03-27 ENCOUNTER — Other Ambulatory Visit (HOSPITAL_BASED_OUTPATIENT_CLINIC_OR_DEPARTMENT_OTHER): Payer: Self-pay | Admitting: Family Medicine

## 2019-03-27 LAB — EKG

## 2019-03-27 MED ORDER — CHOLECALCIFEROL 25 MCG (1000 UT) PO TABS
1000.0000 [IU] | ORAL_TABLET | Freq: Every day | ORAL | 2 refills | Status: DC
Start: 2019-03-27 — End: 2019-05-14

## 2019-03-29 LAB — HEMOGLOBIN A1C
ESTIMATED AVERAGE GLUCOSE: 97 (ref 74–160)
HEMOGLOBIN A1C: 5 % (ref 4.0–5.6)

## 2019-03-30 ENCOUNTER — Encounter (HOSPITAL_BASED_OUTPATIENT_CLINIC_OR_DEPARTMENT_OTHER): Payer: Self-pay | Admitting: Family Medicine

## 2019-04-01 ENCOUNTER — Other Ambulatory Visit (HOSPITAL_BASED_OUTPATIENT_CLINIC_OR_DEPARTMENT_OTHER): Payer: Self-pay | Admitting: Physician Assistant

## 2019-04-02 NOTE — Progress Notes (Signed)
PER Pharmacy, Priscilla Williams is a 48 year old female has requested a refill of Flunisolide Nasal Spray.      Last Office Visit: 03-26-2019 with Laymond Purser  Last Physical Exam: 09-12-2015    PAP SMEAR due on 01/22/2018  HPV SCREENING due on 01/22/2018    Other Med Adult:  Most Recent BP Reading(s)  03/26/19 : (!) 146/96        Cholesterol (mg/dL)   Date Value   03/26/2019 207     LOW DENSITY LIPOPROTEIN DIRECT (mg/dL)   Date Value   03/26/2019 110     HIGH DENSITY LIPOPROTEIN (mg/dL)   Date Value   03/26/2019 75     TRIGLYCERIDES (mg/dL)   Date Value   03/26/2019 83         THYROID SCREEN TSH REFLEX FT4 (uIU/mL)   Date Value   03/26/2019 0.786         TSH (THYROID STIM HORMONE) (uIU/mL)   Date Value   07/31/2015 1.290       HEMOGLOBIN A1C (%)   Date Value   03/26/2019 5.0       No results found for: POCA1C      No results found for: INR    SODIUM (mmol/L)   Date Value   03/26/2019 140       POTASSIUM (mmol/L)   Date Value   03/26/2019 4.5           CREATININE (mg/dL)   Date Value   03/26/2019 0.6       Documented patient preferred pharmacies:    [id number] - [address], [place] - [place]  Phone: [phone] Fax: [phone]

## 2019-04-05 ENCOUNTER — Other Ambulatory Visit (HOSPITAL_BASED_OUTPATIENT_CLINIC_OR_DEPARTMENT_OTHER): Payer: Self-pay | Admitting: Family Medicine

## 2019-04-05 DIAGNOSIS — G43709 Chronic migraine without aura, not intractable, without status migrainosus: Secondary | ICD-10-CM

## 2019-04-06 NOTE — Progress Notes (Unsigned)
Please reach out to PT for an appointment if PT needs more refills, thanks!

## 2019-04-07 ENCOUNTER — Telehealth (HOSPITAL_BASED_OUTPATIENT_CLINIC_OR_DEPARTMENT_OTHER): Payer: Self-pay

## 2019-04-07 NOTE — Progress Notes (Signed)
I called the patient and Left a voicemail to return call    If patient calls back, please: Book the appointment per the following guidance: telephone visit for rx refill in [facility]

## 2019-04-09 ENCOUNTER — Telehealth (HOSPITAL_BASED_OUTPATIENT_CLINIC_OR_DEPARTMENT_OTHER): Payer: Self-pay

## 2019-04-09 ENCOUNTER — Other Ambulatory Visit: Payer: Self-pay

## 2019-04-09 ENCOUNTER — Ambulatory Visit: Payer: BC Managed Care – PPO | Attending: Internal Medicine | Admitting: Family Medicine

## 2019-04-09 VITALS — BP 136/88 | HR 91 | Temp 97.3°F

## 2019-04-09 DIAGNOSIS — Z20828 Contact with and (suspected) exposure to other viral communicable diseases: Secondary | ICD-10-CM | POA: Diagnosis not present

## 2019-04-09 DIAGNOSIS — Z20822 Contact with and (suspected) exposure to covid-19: Secondary | ICD-10-CM

## 2019-04-09 DIAGNOSIS — I10 Essential (primary) hypertension: Secondary | ICD-10-CM | POA: Diagnosis present

## 2019-04-09 LAB — COVID-19 OUTPATIENT: COVID-19 OUTPATIENT: NEGATIVE

## 2019-04-09 MED ORDER — LISINOPRIL 10 MG PO TABS
10.0000 mg | ORAL_TABLET | Freq: Every day | ORAL | 11 refills | Status: DC
Start: 2019-04-09 — End: 2019-04-30

## 2019-04-09 NOTE — Telephone Encounter (Signed)
Neha Sandeep, PA-C  P [facility] Front Desk Pool              Hi- pt needs televisit with PCP team for f/u migraines and med refill. Thx Neha      I called the patient and Left a voicemail to return call    If patient calls back, please: Book the appointment per the following guidance: telephone visit w/ PCP or team

## 2019-04-09 NOTE — Progress Notes (Signed)
Acute Care Clinic Note    Subjective  Priscilla Williams 48 year old English-speaking female who presents for evaluation in acute care clinic.    HPI  Date of symptom onset:    Date of dyspnea onset:       - 62F with h/p obesity, HTN, migraines    # HTN   - Seen 2 weeks ago for multiple concerns including elevated BP readings recently, up to 532D systolic, had been to emergency room in the past for hypertensive emergency   - Started on lisinopril 62m daily   - Has been checking BP cuff at home  - No dizziness, blurry vision   - Occ headaches feels like a tension HA    - Has cut out salt from her diet for past few weeks   - States her BP at home has been in upper 130s-140s/90s most days      ROS  No abdominal pain, vomiting, diarrhea. No lower extremity swelling. No loss of smell. No new rashes or skin changes.     Medical risks for COVID complications:  HTN, obestiy      Per chart review prior to visit:  Social History    Tobacco Use      Smoking status: Never Smoker      Smokeless tobacco: Never Used    Social History    Social History Narrative      Lives with husband and 4 children.  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  More pasta, carbs.  Hard to lose weight.  Work offers free [employer]- would like to find time.            3, 19, 17, 11 y ([place]    Patient Active Problem List:     Migraine     Irritable bowel syndrome     Obesity (BMI 30.0-39.9)     Nasal polyps     Osteoarthritis of cervical spine with myelopathy     Hepatic steatosis      Asymptomatic microscopic hematuria     Brain aneurysm     Essential hypertension    Review of Patient's Allergies indicates:  No Known Allergies    Objective  BP 136/88    Pulse 91    Temp 97.3 F (36.3 C) (Temporal)    SpO2 98%      Most Recent O2 Sat Reading(s)  04/09/19 : 98%  03/26/19 : 96%  02/20/19 : 97%    General: Well-appearing in NAD.  Skin: Warm, dry.   HEENT: NCAT, EOMI, conjunctiva clear, hearing intact to voice, moist mucous membranes.   CV: RRR, no M/R/G  Pulm: CTA bilaterally. No rhonchi, no wheezing, no bibasilar rales  Skin: good skin turgor, not diaphoretic  Extrem: Full ROM of all extremities, no cyanosis, clubbing.  Neuro: Awake, alert.    Psych: Euthymic.  Assessment/Plan    1. Essential hypertension  - BP borderline today at 136/88, and has had high readings at home. Increase lisinopril to 43m daily and advised to cont to monitor BP at home, call back if persistently high  - Cont dietary modification with low Na diet and increased exercise, stress management   - Precautions reviewed including when to call back vs present to ED  - lisinopril (ZESTRIL) 10 MG tablet; Take 1 tablet by mouth daily  Dispense: 30 tablet; Refill: 11    2. Suspected COVID-19 virus infection  - Possible sick contact at work, pt denies sx currently, tested for COVID-19 infection    - COVID-19 OUTPATIENT    Ryoma Nofziger, PA-C    CC to PCP: ALysle Dingwall MD

## 2019-04-10 ENCOUNTER — Telehealth (HOSPITAL_BASED_OUTPATIENT_CLINIC_OR_DEPARTMENT_OTHER): Payer: Self-pay | Admitting: Nurse Practitioner

## 2019-04-10 NOTE — Telephone Encounter (Signed)
PC from pt stating that she got a message that there was something "new" in [place] but is unable to see it.    Pt has a mild ST, cough and headache and denies that the sx's are worsening.    There was no evidence of coronavirus in your sample. It is unlikely that you currently have coronavirus.       If you feel sick, we still recommend staying home and away from others until you feel completely better. Even after you feel completely better, please continue to wear a mask outdoors, avoid large gatherings, and wash your hands often.     There are a few other things to know about this result:   A negative result does not necessarily clear you for work. Contact your employer to discuss their work clearance policies.   This result does not tell us whether you had coronavirus in the past. It does not mean that you have immunity to coronavirus.     We hope that you are feeling better. If you aren't, or if you have any questions or concerns, please do not hesitate to call us at [phone].

## 2019-04-16 ENCOUNTER — Other Ambulatory Visit: Payer: Self-pay

## 2019-04-23 ENCOUNTER — Ambulatory Visit: Payer: BC Managed Care – PPO | Attending: Family Medicine | Admitting: Family Medicine

## 2019-04-23 ENCOUNTER — Telehealth (HOSPITAL_BASED_OUTPATIENT_CLINIC_OR_DEPARTMENT_OTHER): Payer: Self-pay

## 2019-04-23 DIAGNOSIS — I10 Essential (primary) hypertension: Secondary | ICD-10-CM | POA: Diagnosis not present

## 2019-04-23 DIAGNOSIS — G43109 Migraine with aura, not intractable, without status migrainosus: Secondary | ICD-10-CM | POA: Diagnosis not present

## 2019-04-23 MED ORDER — BUTALBITAL-APAP-CAFFEINE 50-325-40 MG PO TABS
1.0000 | ORAL_TABLET | Freq: Four times a day (QID) | ORAL | 0 refills | Status: DC | PRN
Start: 2019-04-23 — End: 2020-01-21

## 2019-04-23 NOTE — Telephone Encounter (Signed)
I called the patient and Left a voicemail to return call    If patient calls back, please: Book the appointment per the following guidance:      Please schedule this patient for a televisit.     Reason for visit: HTN   Schedule with: Any provider from the patient's "team"   Urgency: In 2-4 weeks   If requested appointment not available within the requested time frame: Schedule with alternate provider or at alternate location per patient preference

## 2019-04-23 NOTE — Progress Notes (Signed)
Televisit       Subjective:  Migraines  - has been a little worse recently   - getting about 2-3 times per week   - can't find a pattern/trigger   - usually L sided, not sure exactly of quality but can difference between regular HA and migraine   - if can catch early and takes Fioricet will go away quicker, on the order of hours. Can last into the next day   - prefers not to have a daily medication   - tries to avoid taking anything if doesn't need     HTN  - Lisinopril 10 mg daily   - has home BP cuff   - BPs have been 130s-140s/80s-90s   - medication is going fine   - no CP, SOB, palpitations, edema   - HAs as above     ROS: per above    Objective:  Gen - NAD over the phone, speaking in full sentences, no dyspnea appreciated     A/P:  Priscilla Williams is a 48 year old with     1. Migraine with aura and without status migrainosus, not intractable  Long-standing h/o migraines treated episodically with Fioricet. Has had increased HA frequency recently, not sure of trigger. Consider HTN vs stress of current situation. Declines prophylaxis; doesn't want to be on daily meds. Fioricet works well as abortive. Has tried other meds in the past without success. Only needed 12 tabs in past year. Reviewed PMP and appropriate. Discussed careful use of this medication as it is a controlled substance, but given limited use and efficacy seems reasonable to cont for now. If needing increased quantity, should follow-up with neuro at [facility], who has followed with in the past.      2. Essential hypertension  Sounds largely to be at goal with some readings just above goal on lisinopril 10 mg daily. May need to increase dose further, but asymptomatic aside from possible HAs, which don't seem to closely correlate with elevations in BP. Will plan for home monitoring x 2-4 more weeks and follow-up televisit. Hold on repeat BMP until decide if need to change dose again to minimize blood draws/clinic visits.          Roxy Cedar, MD,  04/23/2019

## 2019-04-27 ENCOUNTER — Other Ambulatory Visit: Payer: Self-pay

## 2019-04-30 ENCOUNTER — Other Ambulatory Visit (HOSPITAL_BASED_OUTPATIENT_CLINIC_OR_DEPARTMENT_OTHER): Payer: Self-pay | Admitting: Family Medicine

## 2019-04-30 DIAGNOSIS — K580 Irritable bowel syndrome with diarrhea: Secondary | ICD-10-CM

## 2019-04-30 DIAGNOSIS — I10 Essential (primary) hypertension: Secondary | ICD-10-CM

## 2019-04-30 NOTE — Telephone Encounter (Signed)
PER Pharmacy, Priscilla Williams is a 48 year old female has requested a refill of doxepin and lisinopril.      Last Office Visit: 04/09/2019 with sandeep  Last Physical Exam: 09/12/2015    PAP SMEAR due on 01/22/2018  HPV SCREENING due on 01/22/2018    Other Med Pedi:  Most Recent BP Reading(s)  04/09/19 : 136/88      Most Recent Weight Reading(s)  04/28/18 : 89.4 kg (197 lb)      Most Recent Height Reading(s)  No data found for Ht

## 2019-05-01 ENCOUNTER — Ambulatory Visit (HOSPITAL_BASED_OUTPATIENT_CLINIC_OR_DEPARTMENT_OTHER): Payer: BC Managed Care – PPO | Admitting: Student in an Organized Health Care Education/Training Program

## 2019-05-03 MED ORDER — LISINOPRIL 10 MG PO TABS
10.0000 mg | ORAL_TABLET | Freq: Every day | ORAL | 0 refills | Status: DC
Start: 2019-05-03 — End: 2019-05-14

## 2019-05-03 MED ORDER — DOXEPIN HCL 10 MG PO CAPS
10.0000 mg | ORAL_CAPSULE | Freq: Every day | ORAL | 0 refills | Status: DC
Start: 2019-05-03 — End: 2019-08-05

## 2019-05-14 ENCOUNTER — Ambulatory Visit: Payer: BC Managed Care – PPO | Attending: Family Medicine | Admitting: Family Medicine

## 2019-05-14 DIAGNOSIS — I10 Essential (primary) hypertension: Secondary | ICD-10-CM

## 2019-05-14 DIAGNOSIS — E559 Vitamin D deficiency, unspecified: Secondary | ICD-10-CM

## 2019-05-14 MED ORDER — LISINOPRIL 20 MG PO TABS
20.0000 mg | ORAL_TABLET | Freq: Every day | ORAL | 0 refills | Status: DC
Start: 2019-05-14 — End: 2019-10-01

## 2019-05-14 MED ORDER — CHOLECALCIFEROL 25 MCG (1000 UT) PO TABS
1000.0000 [IU] | ORAL_TABLET | Freq: Every day | ORAL | 3 refills | Status: DC
Start: 2019-05-14 — End: 2019-07-02

## 2019-05-14 NOTE — Progress Notes (Signed)
Televisit       Subjective:  HTN   - taking lisinopril 10 mg daily   - has been tolerating without SEs   - no lightheadedness/dizziness  - was having dizziness in the past with elevated pressures  - not since BPs have been lower  - checking every couple of days  - largely 130s-140s/80s-90s  - highest 155/94  - lowest in the past few weeks 130s/low 80s   - tries to take BP when relaxed before bed, not at busy times during the day      ROS: per above    Objective:  Gen - NAD over the phone, speaking in full sentences, no dyspnea appreciated     A/P:  Priscilla Williams is a 48 year old with     1. Essential hypertension  Not consistently at goal yet. Given young age and h/o aneurysm, would likely benefit from tighter control. No SEs from the medication. Will increase dose to 20 mg daily. Will need BMP check in a couple weeks. Will cont checks at home and call with any concerning symptoms or pressures.    - lisinopril (ZESTRIL) 20 MG tablet; Take 1 tablet by mouth daily  Dispense: 90 tablet; Refill: 0  - BASIC METABOLIC PANEL; Future    2. Vitamin D deficiency  - cholecalciferol (VITAMIN D3) 1000 UNIT tablet; Take 1 tablet by mouth daily  Dispense: 90 tablet; Refill: [place], MD, 05/14/2019

## 2019-05-19 ENCOUNTER — Telehealth (HOSPITAL_BASED_OUTPATIENT_CLINIC_OR_DEPARTMENT_OTHER): Payer: Self-pay | Admitting: Student in an Organized Health Care Education/Training Program

## 2019-05-19 NOTE — Telephone Encounter (Signed)
-----   Message from Paulino Door, MD sent at 05/14/2019  1:21 PM EST -----  Regarding: Labs needed  Priscilla Williams  [id number]    Please schedule for lab and/or vitals visit as detailed below.    Screening for possible COVID:    In the past 14 days, has the patient had new or worsening (for chronic symptoms) fever, cough, shortness of breath, body aches, runny nose, sore throat, nausea, diarrhea, or lack of smell/taste? No   In the past 14 days, has the patient had COVID or suspected COVID?  No      Labs ordered: Yes  Non-urgent vitals needed: Blood pressure  Specific timeframe, otherwise considered non-urgent:in 2 weeks - not sooner please; starting new medication that needs monitoring   Chart to be cc'd to the following provider for vitals review: Roberta Dennison     Copy/paste the above and:    If labs and vitals required:    [facility]: Route to P [facility] LAB ORDERS POOL  [place]: Route to P [place] LAB ORDERS POOL    If labs only required:    [facility]: Route to P [facility] FRONT END  [facility]: Route to P [facility] FRONT END  [facility]: Route to P [facility] FRONT END  [facility]: Route to P [facility] LAB ORDERS POOL  [place]: Route to P [facility] [place] POOL  [place] labs: Give patient number to call to schedule -- [phone]    If symptoms of COVID or COVID within the past 14 days (yes to questions 1 or 2):     [facility]: Route to Primary Care Lab Requests

## 2019-05-19 NOTE — Telephone Encounter (Signed)
Left voice mail asking patient to give [place] a call back to schedule lab visit   .Kenna Gilbert, [place], 05/19/2019

## 2019-05-26 ENCOUNTER — Telehealth (HOSPITAL_BASED_OUTPATIENT_CLINIC_OR_DEPARTMENT_OTHER): Payer: Self-pay

## 2019-05-26 NOTE — Telephone Encounter (Signed)
Paulino Door, MD  P [facility] Lab Orders Pool    Priscilla Williams   [id number]     Please schedule for lab and/or vitals visit as detailed below.     Screening for possible COVID:     In the past 14 days, has the patient had new or worsening (for chronic symptoms) fever, cough, shortness of breath, body aches, runny nose, sore throat, nausea, diarrhea, or lack of smell/taste? No   In the past 14 days, has the patient had COVID or suspected COVID? No       Labs ordered: Yes   Non-urgent vitals needed: Blood pressure   Specific timeframe, otherwise considered non-urgent:in 2 weeks - not sooner please; starting new medication that needs monitoring   Chart to be cc'd to the following provider for vitals review: Roxy Cedar     Copy/paste the above and:     If labs and vitals required:      Outreach : left a message and  call back number

## 2019-07-02 ENCOUNTER — Other Ambulatory Visit (HOSPITAL_BASED_OUTPATIENT_CLINIC_OR_DEPARTMENT_OTHER): Payer: Self-pay | Admitting: Family Medicine

## 2019-07-02 DIAGNOSIS — E559 Vitamin D deficiency, unspecified: Secondary | ICD-10-CM

## 2019-07-02 MED ORDER — CHOLECALCIFEROL 25 MCG (1000 UT) PO TABS
1000.0000 [IU] | ORAL_TABLET | Freq: Every day | ORAL | 3 refills | Status: DC
Start: 2019-07-02 — End: 2020-06-25

## 2019-07-02 NOTE — Telephone Encounter (Signed)
PER Patient (self), Priscilla Williams is a 49 year old female has requested a refill of cholecalciferol (VITAMIN D3) 1000 UNIT tablet      Last tel. Visit: 05/14/2019 with dennison,R  Last Physical Exam: 09/12/2015    PAP SMEAR due on 01/22/2018  HPV SCREENING due on 01/22/2018    Other Med Adult:  Most Recent BP Reading(s)  04/09/19 : 136/88        Cholesterol (mg/dL)   Date Value   03/26/2019 207     LOW DENSITY LIPOPROTEIN DIRECT (mg/dL)   Date Value   03/26/2019 110     HIGH DENSITY LIPOPROTEIN (mg/dL)   Date Value   03/26/2019 75     TRIGLYCERIDES (mg/dL)   Date Value   03/26/2019 83         THYROID SCREEN TSH REFLEX FT4 (uIU/mL)   Date Value   03/26/2019 0.786         TSH (THYROID STIM HORMONE) (uIU/mL)   Date Value   07/31/2015 1.290       HEMOGLOBIN A1C (%)   Date Value   03/26/2019 5.0       No results found for: POCA1C      No results found for: INR    SODIUM (mmol/L)   Date Value   03/26/2019 140       POTASSIUM (mmol/L)   Date Value   03/26/2019 4.5           CREATININE (mg/dL)   Date Value   03/26/2019 0.6       Documented patient preferred pharmacies:    [id number] [place] Phone: [phone]: [phone]

## 2019-08-05 ENCOUNTER — Other Ambulatory Visit (HOSPITAL_BASED_OUTPATIENT_CLINIC_OR_DEPARTMENT_OTHER): Payer: Self-pay | Admitting: Student in an Organized Health Care Education/Training Program

## 2019-08-05 DIAGNOSIS — K580 Irritable bowel syndrome with diarrhea: Secondary | ICD-10-CM

## 2019-08-05 NOTE — Telephone Encounter (Signed)
PER Patient (self), Priscilla Williams is a 49 year old female has requested a refill of doxepin (SINEQUAN) 10 MG capsule.      Last Office Visit: 04/09/2019 with Elberta Leatherwood  Last Physical Exam: 09/12/2015      Other Med Adult:  Most Recent BP Reading(s)  04/09/19 : 136/88        Cholesterol (mg/dL)   Date Value   03/26/2019 207     LOW DENSITY LIPOPROTEIN DIRECT (mg/dL)   Date Value   03/26/2019 110     HIGH DENSITY LIPOPROTEIN (mg/dL)   Date Value   03/26/2019 75     TRIGLYCERIDES (mg/dL)   Date Value   03/26/2019 83         THYROID SCREEN TSH REFLEX FT4 (uIU/mL)   Date Value   03/26/2019 0.786         TSH (THYROID STIM HORMONE) (uIU/mL)   Date Value   07/31/2015 1.290       HEMOGLOBIN A1C (%)   Date Value   03/26/2019 5.0       No results found for: POCA1C      No results found for: INR    SODIUM (mmol/L)   Date Value   03/26/2019 140       POTASSIUM (mmol/L)   Date Value   03/26/2019 4.5           CREATININE (mg/dL)   Date Value   03/26/2019 0.6       Documented patient preferred pharmacies:        [facility] [address] [place] - [place]  Phone: [phone] Fax: [phone]

## 2019-08-07 MED ORDER — DOXEPIN HCL 10 MG PO CAPS
10.0000 mg | ORAL_CAPSULE | Freq: Every day | ORAL | 0 refills | Status: DC
Start: 2019-08-07 — End: 2019-10-16

## 2019-08-09 ENCOUNTER — Other Ambulatory Visit (HOSPITAL_BASED_OUTPATIENT_CLINIC_OR_DEPARTMENT_OTHER): Payer: Self-pay | Admitting: Student in an Organized Health Care Education/Training Program

## 2019-08-09 DIAGNOSIS — K580 Irritable bowel syndrome with diarrhea: Secondary | ICD-10-CM

## 2019-10-01 ENCOUNTER — Other Ambulatory Visit (HOSPITAL_BASED_OUTPATIENT_CLINIC_OR_DEPARTMENT_OTHER): Payer: Self-pay | Admitting: Student in an Organized Health Care Education/Training Program

## 2019-10-01 DIAGNOSIS — I10 Essential (primary) hypertension: Secondary | ICD-10-CM

## 2019-10-01 NOTE — Telephone Encounter (Signed)
PER Pharmacy, Priscilla Williams is a 49 year old female has requested a refill of     LISINOPRIL .      Last Office Visit: 04/09/2019   with Elberta Leatherwood   Last Physical Exam: 09/12/15    PAP SMEAR due on 01/22/2018  HPV SCREENING due on 01/22/2018    HTN Med:    Most Recent BP Reading(s)  04/09/19 : 136/88  03/26/19 : (!) 146/96  04/28/18 : (!) 181/92      Documented patient preferred pharmacies:    [facility] [address] [place]  Phone: [phone] Fax: [phone]

## 2019-10-02 ENCOUNTER — Encounter (HOSPITAL_BASED_OUTPATIENT_CLINIC_OR_DEPARTMENT_OTHER): Payer: Self-pay | Admitting: Student in an Organized Health Care Education/Training Program

## 2019-10-02 MED ORDER — LISINOPRIL 20 MG PO TABS
20.0000 mg | ORAL_TABLET | Freq: Every day | ORAL | 3 refills | Status: DC
Start: 2019-10-02 — End: 2020-10-11

## 2019-10-06 ENCOUNTER — Ambulatory Visit: Payer: BC Managed Care – HMO | Attending: Family Medicine

## 2019-10-06 DIAGNOSIS — I10 Essential (primary) hypertension: Secondary | ICD-10-CM | POA: Diagnosis not present

## 2019-10-06 LAB — BASIC METABOLIC PANEL
ANION GAP: 3 mmol/L — CL (ref 5–15)
BUN (UREA NITROGEN): 13 mg/dL (ref 7–18)
CALCIUM: 9.1 mg/dL (ref 8.5–10.1)
CARBON DIOXIDE: 29 mmol/L (ref 21–32)
CHLORIDE: 106 mmol/L (ref 98–107)
CREATININE: 0.6 mg/dL (ref 0.4–1.2)
ESTIMATED GLOMERULAR FILT RATE: >60 mL/min (ref >60)
Glucose Random: 89 mg/dL (ref 74–160)
POTASSIUM: 4.7 mmol/L (ref 3.5–5.1)
SODIUM: 138 mmol/L (ref 136–145)

## 2019-10-06 LAB — BMP (EXT)
Anion Gap (EXT): 3 mmol/L — CL (ref 5–15)
BUN (EXT): 13 mg/dL (ref 7–18)
CO2 (EXT): 29 mmol/L (ref 21–32)
CalciumCalcium (EXT): 9.1 mg/dL (ref 8.5–10.1)
Chloride (EXT): 106 mmol/L (ref 98–107)
Creatinine (EXT): 0.6 mg/dL (ref 0.4–1.2)
Glucose (EXT): 89 mg/dL (ref 74–160)
Potassium (EXT): 4.7 mmol/L (ref 3.5–5.1)
Sodium (EXT): 138 mmol/L (ref 136–145)
eGFR - Creat CKD-EPI (EXT): >60 mL/min (ref >60)

## 2019-10-06 NOTE — Progress Notes (Signed)
Labs drawn.  Priscilla Williams, [place], 10/06/2019

## 2019-10-12 ENCOUNTER — Ambulatory Visit (HOSPITAL_BASED_OUTPATIENT_CLINIC_OR_DEPARTMENT_OTHER): Payer: Self-pay | Admitting: Registered Nurse

## 2019-10-12 NOTE — Telephone Encounter (Signed)
Regarding: back pain  ----- Message from Adron Bene sent at 10/12/2019 10:29 AM EDT -----  Priscilla Williams [id number], 49 year old, female    Calls today:  Sick    What are the symptoms back pain  How long has patient been sick? Last Wednesday   What has pt. tried at home n/a  Person calling on behalf of patient: Patient (self)    :    Patient's language of care: English    Patient does not need an interpreter.    Patient's PCP: Lysle Dingwall, MD

## 2019-10-12 NOTE — Telephone Encounter (Signed)
Return call to patient.  Patient reports 1 week of lower back pain "sore". Unsure of trigger, reports she was actve around the house that day.  Using tylenol only with minimal relief.     Denies any other coinciding symptoms.     Advised:  - use tylenol and Advil  - try ice or heat.   - sleep with pillow under knees   - call if symptoms persist or worsen although implementing above suggestions    States understanding, agrees with plan.   Hillery Jacks, RN, 10/12/2019 [phone]

## 2019-10-16 ENCOUNTER — Encounter (HOSPITAL_BASED_OUTPATIENT_CLINIC_OR_DEPARTMENT_OTHER): Payer: Self-pay | Admitting: Student in an Organized Health Care Education/Training Program

## 2019-10-16 DIAGNOSIS — K580 Irritable bowel syndrome with diarrhea: Secondary | ICD-10-CM

## 2019-10-17 NOTE — Telephone Encounter (Signed)
PER Patient (self), Priscilla Williams is a 49 year old female has requested a refill of Doxepin.      Last Office Visit: 05-14-2019 with Roxy Cedar  Last Physical Exam: 09-12-2015    PAP SMEAR due on 01/22/2018  HPV SCREENING due on 01/22/2018    Other Med Adult:  Most Recent BP Reading(s)  04/09/19 : 136/88        Cholesterol (mg/dL)   Date Value   03/26/2019 207     LOW DENSITY LIPOPROTEIN DIRECT (mg/dL)   Date Value   03/26/2019 110     HIGH DENSITY LIPOPROTEIN (mg/dL)   Date Value   03/26/2019 75     TRIGLYCERIDES (mg/dL)   Date Value   03/26/2019 83         THYROID SCREEN TSH REFLEX FT4 (uIU/mL)   Date Value   03/26/2019 0.786         TSH (THYROID STIM HORMONE) (uIU/mL)   Date Value   07/31/2015 1.290       HEMOGLOBIN A1C (%)   Date Value   03/26/2019 5.0       No results found for: POCA1C      No results found for: INR    SODIUM (mmol/L)   Date Value   10/06/2019 138       POTASSIUM (mmol/L)   Date Value   10/06/2019 4.7           CREATININE (mg/dL)   Date Value   10/06/2019 0.6       Documented patient preferred pharmacies:    [facility] [address], [place] - [place]  Phone: [phone] Fax: [phone]

## 2019-10-18 MED ORDER — DOXEPIN HCL 10 MG PO CAPS
10.0000 mg | ORAL_CAPSULE | Freq: Every day | ORAL | 0 refills | Status: DC
Start: 2019-10-18 — End: 2020-02-03

## 2019-10-22 ENCOUNTER — Ambulatory Visit (HOSPITAL_BASED_OUTPATIENT_CLINIC_OR_DEPARTMENT_OTHER): Payer: Self-pay | Admitting: Registered Nurse

## 2019-10-22 NOTE — Telephone Encounter (Signed)
Regarding: back pain  ----- Message from Adron Bene sent at 10/22/2019 12:08 PM EDT -----  Priscilla Williams [id number], 49 year old, female    Calls today:  Sick    What are the symptoms back pain  How long has patient been sick? Three weeks  What has pt. tried at home n/a  Person calling on behalf of patient: Patient (self)        Patient's language of care: English    Patient does not need an interpreter.    Patient's PCP: Lysle Dingwall, MD

## 2019-10-22 NOTE — Telephone Encounter (Signed)
Called back and spoke with patient.    Patient stated she has been having lower back pain greater on the L side for about a month. She has been taking Tylenol and Aleve with little relief. Stated her L toes are numb on and off. Able to ambulate well. Stated she had neck/cervical surgery with hardware placed and did not know if it could be related.    Scheduled a tele visit on 5/6 at 1:40pm.    Informed patient if she is having new or worsening symptoms she should go to the ED for evaluation.

## 2019-10-28 ENCOUNTER — Ambulatory Visit (HOSPITAL_BASED_OUTPATIENT_CLINIC_OR_DEPARTMENT_OTHER): Payer: BC Managed Care – HMO | Admitting: Family Medicine

## 2019-11-04 ENCOUNTER — Other Ambulatory Visit (HOSPITAL_BASED_OUTPATIENT_CLINIC_OR_DEPARTMENT_OTHER): Payer: Self-pay | Admitting: Student in an Organized Health Care Education/Training Program

## 2019-11-04 DIAGNOSIS — K580 Irritable bowel syndrome with diarrhea: Secondary | ICD-10-CM

## 2019-11-17 ENCOUNTER — Telehealth (HOSPITAL_BASED_OUTPATIENT_CLINIC_OR_DEPARTMENT_OTHER): Payer: Self-pay | Admitting: Student in an Organized Health Care Education/Training Program

## 2019-11-17 DIAGNOSIS — R109 Unspecified abdominal pain: Secondary | ICD-10-CM

## 2019-11-17 NOTE — Telephone Encounter (Signed)
Message routed to PCP and referral coordinator.

## 2019-11-17 NOTE — Telephone Encounter (Signed)
Appointment scheduled for: Gastroenterology    Specialty Location:   [facility]                                                   Specialist's name: Dr Waverly Ferrari          Specialist's NPI#: [id number]                           Specialty Phone Number: [phone]     Specialty Fax Number: [phone]      Reason for appointment: Stomach issues pt having endoscopy    Day of appt:                                     Date of appt: 5.27.21    Time of appt:      Patient Notification:  Phone call - Spoke with patient      If you have any questions concerning the referral authorization please call [phone]    If you have any questions concerning your appointment please call the specialty phone number above.

## 2019-11-17 NOTE — Telephone Encounter (Signed)
-----   Message from [facility] sent at 11/17/2019  2:03 PM EDT -----  Regarding: referral needed - endoscopy being done soon  Contact: [place] [id number], 49 year old, female    Calls today:  Clinical Questions ([place])    Name of person calling Zhane  Specific nature of request pt states she been reaching out regarding a referral she needs. States will be having an endoscopy tomorrow and still doesn't have the referral. Please call and thank you  Return phone number [phone]  Person calling on behalf of patient: Patient (self)      Patient's language of care: English    Patient does not need an interpreter.    Patient's PCP: Lysle Dingwall, MD

## 2019-11-18 DIAGNOSIS — Z800 Family history of malignant neoplasm of digestive organs: Secondary | ICD-10-CM | POA: Diagnosis not present

## 2019-11-18 DIAGNOSIS — K2970 Gastritis, unspecified, without bleeding: Secondary | ICD-10-CM | POA: Diagnosis not present

## 2019-11-18 DIAGNOSIS — K219 Gastro-esophageal reflux disease without esophagitis: Secondary | ICD-10-CM | POA: Diagnosis not present

## 2019-11-18 NOTE — Telephone Encounter (Signed)
Please sign this order asap  Thank you

## 2020-01-20 ENCOUNTER — Other Ambulatory Visit (HOSPITAL_BASED_OUTPATIENT_CLINIC_OR_DEPARTMENT_OTHER): Payer: Self-pay | Admitting: Student in an Organized Health Care Education/Training Program

## 2020-01-20 DIAGNOSIS — G43109 Migraine with aura, not intractable, without status migrainosus: Secondary | ICD-10-CM

## 2020-01-20 NOTE — Telephone Encounter (Signed)
PER Patient (self), Priscilla Williams is a 49 year old female has requested a refill of      -  Fioricet       Last Office Visit: 05/14/2019 with Lucile Shutters  Last Physical Exam: 09/12/2015     PAP SMEAR due on 01/22/2018  HPV SCREENING due on 01/22/2018     Other Med Adult:  Most Recent BP Reading(s)  04/09/19 : 136/88        Cholesterol (mg/dL)   Date Value   03/26/2019 207     LOW DENSITY LIPOPROTEIN DIRECT (mg/dL)   Date Value   03/26/2019 110     HIGH DENSITY LIPOPROTEIN (mg/dL)   Date Value   03/26/2019 75     TRIGLYCERIDES (mg/dL)   Date Value   03/26/2019 83         THYROID SCREEN TSH REFLEX FT4 (uIU/mL)   Date Value   03/26/2019 0.786         TSH (THYROID STIM HORMONE) (uIU/mL)   Date Value   07/31/2015 1.290       HEMOGLOBIN A1C (%)   Date Value   03/26/2019 5.0       No results found for: POCA1C      No results found for: INR    SODIUM (mmol/L)   Date Value   10/06/2019 138       POTASSIUM (mmol/L)   Date Value   10/06/2019 4.7           CREATININE (mg/dL)   Date Value   10/06/2019 0.6        Documented patient preferred pharmacies:      [id number] [place] Phone: [phone]: [phone]

## 2020-01-21 ENCOUNTER — Telehealth (HOSPITAL_BASED_OUTPATIENT_CLINIC_OR_DEPARTMENT_OTHER): Payer: Self-pay | Admitting: Registered Nurse

## 2020-01-21 ENCOUNTER — Ambulatory Visit: Payer: BC Managed Care – HMO | Attending: Family Medicine | Admitting: Nurse Practitioner

## 2020-01-21 DIAGNOSIS — M62838 Other muscle spasm: Secondary | ICD-10-CM | POA: Diagnosis not present

## 2020-01-21 DIAGNOSIS — G43109 Migraine with aura, not intractable, without status migrainosus: Secondary | ICD-10-CM | POA: Diagnosis not present

## 2020-01-21 DIAGNOSIS — G43009 Migraine without aura, not intractable, without status migrainosus: Secondary | ICD-10-CM | POA: Diagnosis not present

## 2020-01-21 DIAGNOSIS — M47816 Spondylosis without myelopathy or radiculopathy, lumbar region: Secondary | ICD-10-CM | POA: Diagnosis not present

## 2020-01-21 MED ORDER — BUTALBITAL-APAP-CAFFEINE 50-325-40 MG PO TABS
1.0000 | ORAL_TABLET | Freq: Four times a day (QID) | ORAL | 0 refills | Status: DC | PRN
Start: 2020-01-21 — End: 2020-07-06

## 2020-01-21 NOTE — Progress Notes (Signed)
CC: Migraine medication     Priscilla Williams is a 49 year old female presenting for       Migraines  Long history of migraine headaches  No set trigger lots of stress recently with buying new home and moving  Uses Fioricet sparingly, gets 12 per year  Will take at onset of migraine which are typically left sided and will typically abort symptoms  Requesting refill of medication  Sees Neuro at [facility]  Declines daily prophylaxis for migraines  Has tried other meds and doesn't find relief like Fioricet  Quality of migraines has not changed  Does not like to take medication so avoids unless absolutely negative  No migraine currently, meds last refilled on October 2020  Denies dizziness, numbness, tingling, nausea, vomiting     ROS: As per HPI, all other systems reviewed and negative     Patient Active Problem List:     Migraine     Irritable bowel syndrome     Obesity (BMI 30.0-39.9)     Nasal polyps     Osteoarthritis of cervical spine with myelopathy     Hepatic steatosis     Asymptomatic microscopic hematuria     Brain aneurysm     Essential hypertension       Review of patient's family history indicates:  Problem: Heart      Relation: Brother          Age of Onset: (Not Specified)  Problem: Heart      Relation: Father          Age of Onset: (Not Specified)  Problem: Heart      Relation: Brother          Age of Onset: (Not Specified)  Problem: Stroke      Relation: Father          Age of Onset: (Not Specified)  Problem: Diabetes      Relation: Maternal Grandmother          Age of Onset: (Not Specified)  Problem: Cancer - Lung      Relation: Mother          Age of Onset: (Not Specified)  Problem: Cancer - Other      Relation: Mother          Age of Onset: (Not Specified)          Comment: bone  Problem: Cancer - Breast      Relation: Maternal Grandmother          Age of Onset: (Not Specified)          Comment: post menopausal  Problem: Cancer - Prostate      Relation: Maternal Grandmother          Age of Onset: (Not  Specified)          Comment: post menopausal  Problem: Hypertension      Relation: Mother          Age of Onset: (Not Specified)  Problem: Hypertension      Relation: Father          Age of Onset: (Not Specified)  Problem: Lipids      Relation: Father          Age of Onset: (Not Specified)  Problem: Thyroid      Relation: Sister          Age of Onset: (Not Specified)          Comment: 2 sisters 1 with  hypothyroid, one hyperthyroid       doxepin (SINEQUAN) 10 MG capsule, Take 1 capsule by mouth daily, Disp: 90 capsule, Rfl: 0  lisinopril (ZESTRIL) 20 MG tablet, Take 1 tablet by mouth daily, Disp: 90 tablet, Rfl: 3  cholecalciferol (VITAMIN D3) 1000 UNIT tablet, Take 1 tablet by mouth daily, Disp: 90 tablet, Rfl: 3  butalbital-acetaminophen-caffeine (FIORICET) per tablet, Take 1 tablet by mouth every 6 (six) hours as needed for Pain, Disp: 12 tablet, Rfl: 0    No current facility-administered medications on file prior to visit.       Review of Patient's Allergies indicates:  No Known Allergies     Social History    Tobacco Use      Smoking status: Never Smoker      Smokeless tobacco: Never Used    Alcohol use: Yes      Comment: 1 drink every 6 months, wine    Drug use: No         Exam - vitals deferred in setting of COVID pandemic  Speaking in complete sentences, breathing comfortably       Assessment/Plan:    (G43.109) Migraine with aura and without status migrainosus, not intractable  Comment: 49 year old female with longstanding history of migraine headaches calls today for a refill of Fioricet. Migraines unchanged in characteristics, Declines prophylactic medication, uses Fioricet sparingly, prescribed 12 tablets which have lasted 9 months, reviewed PDMP and appropriate for refill. Discussed if needing medication more frequently to consider alternative therapy versus daily prophylaxis  Plan: butalbital-acetaminophen-caffeine (FIORICET)         per tablet

## 2020-01-21 NOTE — Telephone Encounter (Signed)
-----   Message from Jules Schick sent at 01/21/2020 10:40 AM EDT -----  Regarding: issues with medication refill  Contact: [name] [id number], 49 year old, female    Calls today:  Clinical Questions (NON-SICK CLINICAL QUESTIONS ONLY)    Name of person calling Priscilla Williams  Specific nature of request Pt is calling with a concern regarding her medication refill request. States that she's very angry about the fact that she needs to see the doctor before getting more refills, and that no one told her she needed to do so until she called the pharmacy directly. Writer offered to schedule televisit for med refill but pt became very angry and said she would like to speak with PCP directly. Please advise, thanks  Return phone number [phone]  Person calling on behalf of patient: Patient (self)    CALL BACK NUMBER: [phone]    Patient's language of care: English    Patient does not need an interpreter.    Patient's PCP: Lysle Dingwall, MD

## 2020-01-21 NOTE — Telephone Encounter (Signed)
Called patient back.    Patient stated she takes Fioricet for her migraines as needed not on a daily basis and every time she try's to get a refill she is told she needs to see a doctor but then there's no appointment for months and she ends up in the emergency room. Patient stated she does not abuse the medication and only takes it as needed.     Reviewing her chart there's a note near the medication order that the patient needs to see a doctor before any refills. Patient now aware.    Patient is asking why since she is only given 12 tabs a year.     Advised patient I would forward this information to the provider.    A tele visit was also booked for the patient to talk to a provider today as well at 1:40 pm.    Advised patient to call back with any other questions or concerns.

## 2020-02-03 ENCOUNTER — Other Ambulatory Visit (HOSPITAL_BASED_OUTPATIENT_CLINIC_OR_DEPARTMENT_OTHER): Payer: Self-pay | Admitting: Student in an Organized Health Care Education/Training Program

## 2020-02-03 DIAGNOSIS — K580 Irritable bowel syndrome with diarrhea: Secondary | ICD-10-CM

## 2020-02-03 NOTE — Telephone Encounter (Signed)
PER Pharmacy, Priscilla Williams is a 49 year old female has requested a refill of doxepin (SINEQUAN) 10 MG capsule.      Last Office Visit: 04/09/2019 with Elberta Leatherwood  Last Physical Exam: 09/12/2015      Other Med Adult:  Most Recent BP Reading(s)  04/09/19 : 136/88        Cholesterol (mg/dL)   Date Value   03/26/2019 207     LOW DENSITY LIPOPROTEIN DIRECT (mg/dL)   Date Value   03/26/2019 110     HIGH DENSITY LIPOPROTEIN (mg/dL)   Date Value   03/26/2019 75     TRIGLYCERIDES (mg/dL)   Date Value   03/26/2019 83         THYROID SCREEN TSH REFLEX FT4 (uIU/mL)   Date Value   03/26/2019 0.786         TSH (THYROID STIM HORMONE) (uIU/mL)   Date Value   07/31/2015 1.290       HEMOGLOBIN A1C (%)   Date Value   03/26/2019 5.0       No results found for: POCA1C      No results found for: INR    SODIUM (mmol/L)   Date Value   10/06/2019 138       POTASSIUM (mmol/L)   Date Value   10/06/2019 4.7           CREATININE (mg/dL)   Date Value   10/06/2019 0.6       Documented patient preferred pharmacies:    [facility] [place], [place]  Phone: [phone] Fax: [phone]

## 2020-03-03 DIAGNOSIS — M4726 Other spondylosis with radiculopathy, lumbar region: Secondary | ICD-10-CM | POA: Diagnosis not present

## 2020-03-03 DIAGNOSIS — I6521 Occlusion and stenosis of right carotid artery: Secondary | ICD-10-CM | POA: Diagnosis not present

## 2020-03-03 DIAGNOSIS — M4696 Unspecified inflammatory spondylopathy, lumbar region: Secondary | ICD-10-CM | POA: Diagnosis not present

## 2020-03-03 DIAGNOSIS — M5117 Intervertebral disc disorders with radiculopathy, lumbosacral region: Secondary | ICD-10-CM | POA: Diagnosis not present

## 2020-03-03 DIAGNOSIS — M9983 Other biomechanical lesions of lumbar region: Secondary | ICD-10-CM | POA: Diagnosis not present

## 2020-03-17 DIAGNOSIS — M5412 Radiculopathy, cervical region: Secondary | ICD-10-CM | POA: Diagnosis not present

## 2020-03-31 DIAGNOSIS — R252 Cramp and spasm: Secondary | ICD-10-CM | POA: Diagnosis not present

## 2020-03-31 DIAGNOSIS — M47812 Spondylosis without myelopathy or radiculopathy, cervical region: Secondary | ICD-10-CM | POA: Diagnosis not present

## 2020-03-31 DIAGNOSIS — M47816 Spondylosis without myelopathy or radiculopathy, lumbar region: Secondary | ICD-10-CM | POA: Diagnosis not present

## 2020-03-31 DIAGNOSIS — G43909 Migraine, unspecified, not intractable, without status migrainosus: Secondary | ICD-10-CM | POA: Diagnosis not present

## 2020-04-04 ENCOUNTER — Telehealth (HOSPITAL_BASED_OUTPATIENT_CLINIC_OR_DEPARTMENT_OTHER): Payer: Self-pay | Admitting: Student in an Organized Health Care Education/Training Program

## 2020-04-04 DIAGNOSIS — G43009 Migraine without aura, not intractable, without status migrainosus: Secondary | ICD-10-CM

## 2020-04-04 NOTE — Telephone Encounter (Signed)
Appointment scheduled for: Neurology    Specialty Location:   [facility]                                                   Specialist's name: Lendell Caprice          Specialist's NPI#: [id number]                           Specialty Phone Number: [phone]     Specialty Fax Number: [phone]    Reason for appointment: Q03.474    Day of appt:                                     Date of appt: 03/31/2020    Time of appt:    Patient Notification:  Incoming fax      If you have any questions concerning the referral authorization please call [phone]    If you have any questions concerning your appointment please call the specialty phone number above.

## 2020-04-13 ENCOUNTER — Other Ambulatory Visit: Payer: Self-pay

## 2020-05-01 ENCOUNTER — Encounter (HOSPITAL_BASED_OUTPATIENT_CLINIC_OR_DEPARTMENT_OTHER): Payer: Self-pay | Admitting: Physician Assistant

## 2020-05-01 ENCOUNTER — Ambulatory Visit: Payer: BC Managed Care – HMO | Attending: Family Medicine | Admitting: Physician Assistant

## 2020-05-01 ENCOUNTER — Other Ambulatory Visit: Payer: Self-pay

## 2020-05-01 VITALS — BP 132/80 | HR 72 | Temp 97.4°F | Ht 60.0 in | Wt 217.0 lb

## 2020-05-01 DIAGNOSIS — R1011 Right upper quadrant pain: Secondary | ICD-10-CM

## 2020-05-01 DIAGNOSIS — K760 Fatty (change of) liver, not elsewhere classified: Secondary | ICD-10-CM | POA: Insufficient documentation

## 2020-05-01 DIAGNOSIS — R930 Abnormal findings on diagnostic imaging of skull and head, not elsewhere classified: Secondary | ICD-10-CM

## 2020-05-01 DIAGNOSIS — R932 Abnormal findings on diagnostic imaging of liver and biliary tract: Secondary | ICD-10-CM

## 2020-05-01 DIAGNOSIS — R911 Solitary pulmonary nodule: Secondary | ICD-10-CM | POA: Diagnosis not present

## 2020-05-01 DIAGNOSIS — I10 Essential (primary) hypertension: Secondary | ICD-10-CM | POA: Diagnosis not present

## 2020-05-01 DIAGNOSIS — IMO0001 Reserved for inherently not codable concepts without codable children: Secondary | ICD-10-CM

## 2020-05-01 NOTE — Progress Notes (Signed)
Priscilla Williams is a 49 year old female here for: Other (Gallbladder attack ,MRI and CT results) and Imm/Inj (Pt declined vaccines)    Patient Active Problem List:     Migraine     Irritable bowel syndrome     Obesity (BMI 30.0-39.9)     Nasal polyps     Osteoarthritis of cervical spine with myelopathy     Hepatic steatosis     Asymptomatic microscopic hematuria     Brain aneurysm     Essential hypertension    HPI:    #Sinus problem   -sometimes feels mild pain over left maxillary sinus, a/w dizziness   -had CTA Head and CTA neck at [facility] 03/06/2020 and was told left maxillary sinuses were full  -migraine from times to times  -not a/w nasal congestion, rhinorrhea     #lung nodule   -6 mm nodule in left apex noted in 03/06/20 CTA Head and neck at [facility], persistent since 04/28/2018     #abdominal pain, RUQ  -x 2 weeks  -pain woke up her in from sleep 2 weeks ago--was a/w vomiting and diarrhea  -radiates to back   -pain has improved but still has soreness in RUQ abdomen    -pain is constant in nature   -has been cautious with diet and has noticed some improvement in pain   -no jaundice       REVIEW OF SYSTEMS:  As per HPI. No fevers or weight loss. No unusual headaches. No dyspnea or chest pain. No sadness or anxiety that interferes with daily activities.    PE:  BP 132/80    Pulse 72    Temp 97.4 F (36.3 C) (Temporal)    Ht 5' (1.524 m)    Wt 98.4 kg (217 lb)    SpO2 100%    BMI 42.38 kg/m   GENERAL: well appearing, BMI noted, NAD, pleasant, cooperative   EYES: EOMI, Conjunctiva Clear  ENT: normal TMs, pharynx, dentition normal, nares patent without abnormalities.   NECK: normal, supple, no masses or abnormal lymph nodes or abnormal lymph nodes.   LUNGS: clear to auscultation, no wheezing or ronchi.   HEART: regular rate and rhythm and S1, S2 normal , no murmurs or gallops;   ABD: mild discomfort in RUQ with deep palpation, negative Murphy's; soft, non-tender, no organomegaly or masses, not distended.    MS: normal, symmetric, no edema  SKIN: no rash or lesions  NEURO: normal; strength and muscle bulk are symmetric.  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  Stable 6 mm ground-glass nodule at the left  apex.  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     Assessment & Plan:  (R10.11) Right upper quadrant abdominal pain  (primary encounter diagnosis)  (K76.0) Hepatic steatosis  Comment: 26 female with hx of obesity, hepatic steatosis with isolated episode of RUQ pain associated vomiting and diarrhea. Pain has improvement significantly but patient is still experiencing constant soreness in the area. DDx biliary colic, hepatitis, dyspepsia, deferred back pain. No signs or symptoms of cholecystitis.   Plan:   -discussed low fat diet   -discussed ED warnings   -US ABDOMEN LIMITED  -consider gen-surg referral pending [place] results     (R93.0) Abnormal CT scan,  sinus  Comment: intermittent left maxillary pain in the setting of abnormal CT scan of left maxillary sinus 2 years ago.   Plan: REFERRAL TO ENT ( INT)    (R91.1) Lung nodule < 6cm on CT  Comment: noted in recent CTA Head and neck at [facility] 2 months ago, persistent since 04/2018.   Plan: CT CHEST WO CONTRAST    (I10) Essential hypertension  Comment: well controlled   Plan: continue current regimen as previously prescribed       HM: patient deferred vaccines today, she said she has a GYN for pap   Next visit: PRN    The patient verbalized understanding of the above issues and agrees with the treatment plan.  The patient is given an AVS.

## 2020-05-01 NOTE — Patient Instructions (Signed)
Specialties Phone Numbers     [place] [facility] [phone]  Surgical Specialties [phone]          Audiology [phone]  Physiatry [phone]   [place] [phone]  Plastic Surgery [phone]   Cardiology [phone]  Podiatry [phone]   Dermatology [phone] option 3  Pulmonology [phone]   Endocrinology [phone]  Rheumatology [phone]   ENT/Allergy [phone]  Sports Medicine [phone]   Gastroenterology [phone]  [facility] Clinic [phone]   Infectious Diseases [phone]  Thoracic Surgery [phone]   Nephrology [phone]  Travel Clinic (18+) [phone]   Neurology/EMG [phone]  Urology [phone]   Occupational Health [phone]  Vascular Surgery [phone]   Orthopedics [phone]  [place] ([place]) [phone]          [facility] (Ophthalmology/Optometry)  [place] (Gyn/OB/Midwives)   Karoline Caldwell [phone]  [place] [phone]   [place]  Dala Dock [phone]      [place] [phone]          Radiology [phone]  Cards/Pulm Testing [phone]           PT/OT/Speech     [place]  [place]         External Preferred  Pre-Registration     [facility] [phone]  [facility] [phone]   [facility] [place]      Scheduling Numbers       [facility] [phone]   [facility] [phone]

## 2020-05-01 NOTE — Procedures (Deleted)
Priscilla Stakes, MD - 01/29/2019  Formatting of this note might be different from the original.  EXAM DESCRIPTION:  MAMMO SCREENING TOMOSYNTHESIS BILATERAL    CLINICAL HISTORY:  49 y/o   F   Encounter for screening mammogram for  malignant neoplasm of breast Routine Mammogram Screening.    COMPARISON:  Studies dating back to 2015    TECHNIQUE:  CC and MLO views with tomosynthesis of both breasts are  submitted.    FINDINGS:  The breast pa renchyma is composed of scattered  fibroglandular densities.  There are no suspicious microcalcifications, spiculated masses or  areas of unexplained architectural distortion within either breast.  No skin thickening or nipple retraction seen. No abnormally enlarged  lymph nodes identified. No new or suspicious mammographic findings.  Findings are confirmed with 3D tomosynthesis views.  The CAD R2 ImageChecker version 2.4 has been appli ed to this  examination.  MIPS Measure 225: Patient's information was entered into a reminder  system with a target date for the next mammogram.    IMPRESSION:  1. No mammographic evidence of malignancy.  If there are no new  physical findings, routine guidelines regarding screening mammography  is recommended.  BI-RADS 2: Benign Finding  ResultCode: BEN  Follow-up Code: 63YR  Additional Code: WN4

## 2020-05-01 NOTE — Procedures (Deleted)
Kennis Carina, MD - 07/20/2016  Formatting of this note might be different from the original.  RADIOLOGY REPORT                                       Name: Webb Laws Phy: Nicki Guadalajara MD                                                         DOB: 01/07/1971 Sex: F                                                         Acct: [id number] Location: [facility]                                                          Exam Date: 06/30/2013 Status: S                                                         Unit No: [id number]                                                         Dictated by: DARM-BC                                    Campus: [facility]    EXAM#               [place]  [id number]           DIGITAL CAD SCREEN MAMMO COMBO          NEGATIVE             EXAM D ESCRIPTION:  DIGITAL CAD SCREEN MAMMO COMBO                                                 CLINICAL HISTORY:                                                            SCREENING   family history of breast cancer.                                   COMPARISON:                                                                  Mammograms dating back to 2007.                                                 TECHNIQUE:                                                                   CC and MLO with tomosynthesis and C-view reconstruction views of both        breasts are submitted.                                                         FINDINGS:                                                                    The breast parenchyma is composed of scattered fibroglandular                densities.                                                                          There are no suspicious microcalcifications, spiculated masses or  areas of distortion.                                                                The CAD R2 ImageChecker version 2.4 has been applied to this                 examination.                                                                         IMPRESSION:                                                                   BIRADS category 1, negative, no suspicious findings. One year                mammographic follow up is recommended.                                                 BI-RADS 1: Negative Mammogram                                                      ResultCode: NEG                                                                Follow-up Code: 77YR                                                            Additional Code: Bi1                                                                                      ** REPORT SIGNED IN OTHER VENDOR SYSTEM  06/30/2013 **  Reported By: Kennis Carina MD

## 2020-05-07 ENCOUNTER — Encounter (HOSPITAL_BASED_OUTPATIENT_CLINIC_OR_DEPARTMENT_OTHER): Payer: Self-pay

## 2020-05-09 ENCOUNTER — Other Ambulatory Visit (HOSPITAL_BASED_OUTPATIENT_CLINIC_OR_DEPARTMENT_OTHER): Payer: Self-pay | Admitting: Student in an Organized Health Care Education/Training Program

## 2020-05-09 DIAGNOSIS — K580 Irritable bowel syndrome with diarrhea: Secondary | ICD-10-CM

## 2020-05-09 DIAGNOSIS — M79609 Pain in unspecified limb: Secondary | ICD-10-CM | POA: Diagnosis not present

## 2020-05-09 NOTE — Telephone Encounter (Signed)
PER Pharmacy, Priscilla Williams is a 49 year old female has requested a refill of Doxepin.        Last Office Visit: 04/09/19 with Elberta Leatherwood  Last Physical Exam: 09/12/15     PAP SMEAR due on 01/22/2018  HPV SCREENING due on 01/22/2018     Other Med Adult:  Most Recent BP Reading(s)  05/01/20 : 132/80        Cholesterol (mg/dL)   Date Value   03/26/2019 207     LOW DENSITY LIPOPROTEIN DIRECT (mg/dL)   Date Value   03/26/2019 110     HIGH DENSITY LIPOPROTEIN (mg/dL)   Date Value   03/26/2019 75     TRIGLYCERIDES (mg/dL)   Date Value   03/26/2019 83         THYROID SCREEN TSH REFLEX FT4 (uIU/mL)   Date Value   03/26/2019 0.786         TSH (THYROID STIM HORMONE) (uIU/mL)   Date Value   07/31/2015 1.290       HEMOGLOBIN A1C (%)   Date Value   03/26/2019 5.0       No results found for: POCA1C      No results found for: INR    SODIUM (mmol/L)   Date Value   10/06/2019 138       POTASSIUM (mmol/L)   Date Value   10/06/2019 4.7           CREATININE (mg/dL)   Date Value   10/06/2019 0.6        Documented patient preferred pharmacies:    [facility] - [place], [place]  Phone: [phone] Fax: [phone]

## 2020-06-05 ENCOUNTER — Telehealth (HOSPITAL_BASED_OUTPATIENT_CLINIC_OR_DEPARTMENT_OTHER): Payer: Self-pay | Admitting: Student in an Organized Health Care Education/Training Program

## 2020-06-05 DIAGNOSIS — M79609 Pain in unspecified limb: Secondary | ICD-10-CM

## 2020-06-05 NOTE — Telephone Encounter (Signed)
Appointment scheduled for: Neurosurgery    Specialty Location:   [facility]                                                   Specialist's name: Dr Lovett Calender          Specialist's NPI#: [id number]                             Specialty Phone Number: [phone]     Specialty Fax Number: [phone]    Reason for appointment: A16.553                                        Date of appt: 11.16.21       Patient Notification:  Incoming fax      If you have any questions concerning the referral authorization please call [phone]    If you have any questions concerning your appointment please call the specialty phone number above.

## 2020-06-06 NOTE — Telephone Encounter (Signed)
Please sign order for Neurosurgery  Thank you

## 2020-06-07 ENCOUNTER — Ambulatory Visit: Payer: BC Managed Care – HMO | Attending: Otolaryngology | Admitting: Otolaryngology

## 2020-06-07 ENCOUNTER — Encounter (HOSPITAL_BASED_OUTPATIENT_CLINIC_OR_DEPARTMENT_OTHER): Payer: Self-pay | Admitting: Otolaryngology

## 2020-06-07 ENCOUNTER — Other Ambulatory Visit: Payer: Self-pay

## 2020-06-07 DIAGNOSIS — J341 Cyst and mucocele of nose and nasal sinus: Secondary | ICD-10-CM | POA: Diagnosis present

## 2020-06-07 DIAGNOSIS — R519 Headache, unspecified: Secondary | ICD-10-CM | POA: Insufficient documentation

## 2020-06-07 NOTE — Progress Notes (Signed)
HPI: 49 year old female Priscilla Williams complains of left maxillary sinusitis  Had some dizziness, headache and sometimes pressure in the left cheek for months  Had CTA done at [facility] for dizziness which has since gone away  I reviewed and independently interpreted the CTA head recently performed which shows near complete opacification of left maxillary sinus, small mucous retention cyst vs polyp in right max sinus  No nasal blockage  No nasal discharge  No hx allergies  Pt states she was told in the past she must have a nasal polyp by her PCP and was given a spray but has also seen an ENT long ago who did a scope and saw one then      In the past has blown her nose and that led to drainage from the left nostril which was surprising in quantity  She wakes up with headaches on left side of her face and head at least twice a week  Also has hx migraine              Past Medical History:  No date: Anemia  No date: Headache  No date: IBS (irritable bowel syndrome)  03/22/2010: Nasal polyps  No date: Obesity    Review of Patient's Allergies indicates:  No Known Allergies    doxepin (SINEQUAN) 10 MG capsule, TAKE ONE CAPSULE BY MOUTH DAILY, Disp: 90 capsule, Rfl: 0  butalbital-acetaminophen-caffeine (FIORICET) per tablet, Take 1 tablet by mouth every 6 (six) hours as needed for Pain, Disp: 12 tablet, Rfl: 0  lisinopril (ZESTRIL) 20 MG tablet, Take 1 tablet by mouth daily, Disp: 90 tablet, Rfl: 3  cholecalciferol (VITAMIN D3) 1000 UNIT tablet, Take 1 tablet by mouth daily, Disp: 90 tablet, Rfl: 3    No current facility-administered medications on file prior to visit.      Social and family history reviewed either in EPIC or with patient and are not contributory unless specifically noted in HPI.          Physical Exam  There were no vitals filed for this visit.    EXAM  General: Well-developed, well-nourished female oriented to person, time, and place.     Voice Quality & Communication:   Speech clear and intelligible, normal  voice without hoarseness, no breathiness or stridor    Head and Face:  NCAT, Face symmetric, normal & symmetric facial strength, no significant skin lesions or scars       Ears:  Grossly normal hearing    Nose:  Dorsum essentially midline with no skin lesions. Anterior rhinoscopy: Septum midline, turbinates wnl, minimal mucosal edema    Oral Cavity:  Lips, tongue, floor of mouth, gingival and buccal mucosa without lesions or masses, tongue midline and mobile. No apparent dental caries.     Oropharynx:  Palate mobile, uvula midline, no tonsillar hypertrophy or exudates, posterior pharynx clear     Preprocedure diagnosis: facial pain. Headaches, opacification of left max sinus on CTA head  Unable to see with mirror exam.    NASAL ENDOSCOPY      Verbal consent was obtained. The nose was decongested and anesthetized with a mixture of Neo-Synephrine and 4% xylocaine.  A flexible fiberoptic scope was passed.      Findings:   Nose: Uncinate process/turbinates-no edema or erythema.  No purulence. Left max ostium is visible with glistening polyp vs mucous retention cyst in sinus cavity-grayish-appears more cystic than solid  Nasopharynx: No masses or exudates. Eustachian tube orifices patent.  Postprocedure diagnosis: left maxillary sinus mucous retention cyst, facial pain/headaches          A/P: 49 yo with left maxillary sinus near complete opacification-appears to be mucous retention cyst (although cannot r/o polyp). She has left sided cheek/facial pressure and left sided headaches twice a week. Also with hx migraine. CTA head at [facility] was reviewed showing near complete left max opac and small polyp vs MRC in right max sinus. She has no right sided symptoms. Will check CT sinuses and televisit f/u to ensure only left max sinus needs to be addressed. Given she is symptomatic I recommend left maxillary antrostomy with tissue removal endoscopically-she signed consent after r/b/a discussed.

## 2020-06-10 DIAGNOSIS — Z20828 Contact with and (suspected) exposure to other viral communicable diseases: Secondary | ICD-10-CM | POA: Diagnosis not present

## 2020-06-12 DIAGNOSIS — Z20828 Contact with and (suspected) exposure to other viral communicable diseases: Secondary | ICD-10-CM | POA: Diagnosis not present

## 2020-06-23 DIAGNOSIS — Z7182 Exercise counseling: Secondary | ICD-10-CM | POA: Diagnosis not present

## 2020-06-25 ENCOUNTER — Other Ambulatory Visit (HOSPITAL_BASED_OUTPATIENT_CLINIC_OR_DEPARTMENT_OTHER): Payer: Self-pay | Admitting: Family Medicine

## 2020-06-25 DIAGNOSIS — E559 Vitamin D deficiency, unspecified: Secondary | ICD-10-CM

## 2020-06-25 NOTE — Telephone Encounter (Signed)
PER Pharmacy, Priscilla Williams is a 50 year old female has requested a refill of vitamin d.      Last Office Visit: 05/01/2020 with sonnly ribourg  Last Physical Exam: 09/12/15      Other Med Adult:  Most Recent BP Reading(s)  05/01/20 : 132/80        Cholesterol (mg/dL)   Date Value   03/26/2019 207     LOW DENSITY LIPOPROTEIN DIRECT (mg/dL)   Date Value   03/26/2019 110     HIGH DENSITY LIPOPROTEIN (mg/dL)   Date Value   03/26/2019 75     TRIGLYCERIDES (mg/dL)   Date Value   03/26/2019 83         THYROID SCREEN TSH REFLEX FT4 (uIU/mL)   Date Value   03/26/2019 0.786         TSH (THYROID STIM HORMONE) (uIU/mL)   Date Value   07/31/2015 1.290       HEMOGLOBIN A1C (%)   Date Value   03/26/2019 5.0       No results found for: POCA1C      No results found for: INR    SODIUM (mmol/L)   Date Value   10/06/2019 138       POTASSIUM (mmol/L)   Date Value   10/06/2019 4.7           CREATININE (mg/dL)   Date Value   10/06/2019 0.6       Documented patient preferred pharmacies:    [id number] [place] [address] Phone: [phone]: [phone]

## 2020-06-26 ENCOUNTER — Ambulatory Visit
Admission: RE | Admit: 2020-06-26 | Discharge: 2020-06-26 | Disposition: A | Discharge status: Home / Self Care | Payer: BC Managed Care – HMO | Attending: Physician Assistant | Admitting: Physician Assistant

## 2020-06-26 ENCOUNTER — Encounter (HOSPITAL_BASED_OUTPATIENT_CLINIC_OR_DEPARTMENT_OTHER): Payer: Self-pay | Admitting: Physician Assistant

## 2020-06-26 ENCOUNTER — Telehealth (HOSPITAL_BASED_OUTPATIENT_CLINIC_OR_DEPARTMENT_OTHER): Payer: Self-pay | Admitting: Physician Assistant

## 2020-06-26 ENCOUNTER — Other Ambulatory Visit (HOSPITAL_BASED_OUTPATIENT_CLINIC_OR_DEPARTMENT_OTHER): Payer: Self-pay | Admitting: Physician Assistant

## 2020-06-26 ENCOUNTER — Ambulatory Visit (HOSPITAL_BASED_OUTPATIENT_CLINIC_OR_DEPARTMENT_OTHER)
Admission: RE | Admit: 2020-06-26 | Discharge: 2020-06-26 | Disposition: A | Discharge status: Home / Self Care | Payer: BC Managed Care – HMO | Source: Ambulatory Visit

## 2020-06-26 ENCOUNTER — Other Ambulatory Visit: Payer: Self-pay

## 2020-06-26 DIAGNOSIS — K760 Fatty (change of) liver, not elsewhere classified: Secondary | ICD-10-CM | POA: Insufficient documentation

## 2020-06-26 DIAGNOSIS — R911 Solitary pulmonary nodule: Secondary | ICD-10-CM

## 2020-06-26 DIAGNOSIS — I2510 Atherosclerotic heart disease of native coronary artery without angina pectoris: Secondary | ICD-10-CM | POA: Diagnosis not present

## 2020-06-26 DIAGNOSIS — R1011 Right upper quadrant pain: Secondary | ICD-10-CM | POA: Insufficient documentation

## 2020-06-26 DIAGNOSIS — R079 Chest pain, unspecified: Secondary | ICD-10-CM | POA: Diagnosis not present

## 2020-06-26 DIAGNOSIS — IMO0001 Reserved for inherently not codable concepts without codable children: Secondary | ICD-10-CM

## 2020-06-26 DIAGNOSIS — R55 Syncope and collapse: Secondary | ICD-10-CM | POA: Diagnosis not present

## 2020-06-26 NOTE — Telephone Encounter (Signed)
Called patient to discuss imaging results. No answer. Will send message via mychart.

## 2020-06-26 NOTE — Addendum Note (Signed)
Addended by: Rivka Spring on: 06/26/2020 10:10 AM     Modules accepted: Orders

## 2020-06-26 NOTE — Progress Notes (Signed)
Message sent to [facility]

## 2020-06-30 ENCOUNTER — Telehealth (HOSPITAL_BASED_OUTPATIENT_CLINIC_OR_DEPARTMENT_OTHER): Payer: Self-pay | Admitting: Ambulatory Care

## 2020-06-30 NOTE — Telephone Encounter (Signed)
-----   Message from [place] Degree sent at 06/30/2020  1:26 PM EST -----  Regarding: Urgent/Requesting Referral For MRI  Contact: [place] [phone], 50 year old, female    Calls today:  Clinical Questions ([place])    Name of person calling [place]    Specific nature of request Pt is requesting to please send Referral for MRI to Department of Radiology at [facility] [place], [place], [place], [place] [address] as to this is where pt would like to have the MRI.      Return phone number [phone]  Person calling on behalf of patient: Patient (self)      Patient's language of care: English    Patient does not need an interpreter.    Patient's PCP: Lysle Dingwall, MD

## 2020-07-06 ENCOUNTER — Encounter (HOSPITAL_BASED_OUTPATIENT_CLINIC_OR_DEPARTMENT_OTHER): Payer: Self-pay | Admitting: Student in an Organized Health Care Education/Training Program

## 2020-07-06 ENCOUNTER — Encounter (HOSPITAL_BASED_OUTPATIENT_CLINIC_OR_DEPARTMENT_OTHER): Payer: Self-pay | Admitting: Nurse Practitioner

## 2020-07-06 DIAGNOSIS — G43109 Migraine with aura, not intractable, without status migrainosus: Secondary | ICD-10-CM

## 2020-07-06 NOTE — Telephone Encounter (Signed)
PER Patient (self), Priscilla Williams is a 50 year old female has requested a refill of fioricet.      Last Office Visit: [id number] with Anthony Sar  Last Physical Exam: [id number]      Other Med Adult:  Most Recent BP Reading(s)  05/01/20 : 132/80        Cholesterol (mg/dL)   Date Value   03/26/2019 207     LOW DENSITY LIPOPROTEIN DIRECT (mg/dL)   Date Value   03/26/2019 110     HIGH DENSITY LIPOPROTEIN (mg/dL)   Date Value   03/26/2019 75     TRIGLYCERIDES (mg/dL)   Date Value   03/26/2019 83         THYROID SCREEN TSH REFLEX FT4 (uIU/mL)   Date Value   03/26/2019 0.786         TSH (THYROID STIM HORMONE) (uIU/mL)   Date Value   07/31/2015 1.290       HEMOGLOBIN A1C (%)   Date Value   03/26/2019 5.0       No results found for: POCA1C      No results found for: INR    SODIUM (mmol/L)   Date Value   10/06/2019 138       POTASSIUM (mmol/L)   Date Value   10/06/2019 4.7           CREATININE (mg/dL)   Date Value   10/06/2019 0.6       Documented patient preferred pharmacies:    [facility] - [place], [place]  Phone: [phone] Fax: [phone]

## 2020-07-06 NOTE — Telephone Encounter (Signed)
Duplicate request please refer to today's Mychart message  Thank you

## 2020-07-07 ENCOUNTER — Other Ambulatory Visit: Payer: Self-pay

## 2020-07-07 ENCOUNTER — Ambulatory Visit
Admission: RE | Admit: 2020-07-07 | Discharge: 2020-07-07 | Disposition: A | Discharge status: Home / Self Care | Payer: BC Managed Care – HMO | Attending: Otolaryngology | Admitting: Otolaryngology

## 2020-07-07 DIAGNOSIS — J3489 Other specified disorders of nose and nasal sinuses: Secondary | ICD-10-CM | POA: Insufficient documentation

## 2020-07-07 DIAGNOSIS — J341 Cyst and mucocele of nose and nasal sinus: Secondary | ICD-10-CM

## 2020-07-09 MED ORDER — BUTALBITAL-APAP-CAFFEINE 50-325-40 MG PO TABS
1.0000 | ORAL_TABLET | Freq: Four times a day (QID) | ORAL | 0 refills | Status: DC | PRN
Start: 2020-07-09 — End: 2022-03-14

## 2020-07-11 ENCOUNTER — Telehealth (HOSPITAL_BASED_OUTPATIENT_CLINIC_OR_DEPARTMENT_OTHER): Payer: Self-pay

## 2020-07-11 NOTE — Telephone Encounter (Signed)
Left message on pts voice mail for her to call me to discuss scheduling his surgery with Dr. Marvetta Gibbons.

## 2020-07-14 ENCOUNTER — Telehealth (HOSPITAL_BASED_OUTPATIENT_CLINIC_OR_DEPARTMENT_OTHER): Payer: Self-pay | Admitting: Student in an Organized Health Care Education/Training Program

## 2020-07-14 NOTE — Telephone Encounter (Signed)
Called the pt to inform her that her MRI has been aprroved. I faxed it to [facility] and confirmed they had the order. I gave the the number and told her can call them to schedule her appt.

## 2020-07-28 ENCOUNTER — Ambulatory Visit: Payer: BC Managed Care – HMO | Attending: Otolaryngology | Admitting: Otolaryngology

## 2020-07-28 ENCOUNTER — Encounter (HOSPITAL_BASED_OUTPATIENT_CLINIC_OR_DEPARTMENT_OTHER): Payer: Self-pay | Admitting: Otolaryngology

## 2020-07-28 DIAGNOSIS — J341 Cyst and mucocele of nose and nasal sinus: Secondary | ICD-10-CM | POA: Diagnosis not present

## 2020-07-28 DIAGNOSIS — R519 Headache, unspecified: Secondary | ICD-10-CM | POA: Diagnosis not present

## 2020-07-28 NOTE — Progress Notes (Signed)
TELEVISIT    HPI: 50 year old female Priscilla Williams complains of left facial pain  Televisit to discuss results of recent CT  I reviewed and independently interpreted the CT sinuses recently performed which shows, near complete left maxillary sinus opacification. Small mucous retention cyst vs polyp in right max sinus floor. Sinuses otherwise clear.               Past Medical History:  No date: Anemia  No date: Headache  No date: IBS (irritable bowel syndrome)  03/22/2010: Nasal polyps  No date: Obesity    Review of Patient's Allergies indicates:  No Known Allergies    D3-1000 25 MCG (1000 UT) tablet, TAKE 1 TABLET BY MOUTH EVERY DAY, Disp: 90 tablet, Rfl: 0  doxepin (SINEQUAN) 10 MG capsule, TAKE ONE CAPSULE BY MOUTH DAILY, Disp: 90 capsule, Rfl: 0  lisinopril (ZESTRIL) 20 MG tablet, Take 1 tablet by mouth daily, Disp: 90 tablet, Rfl: 3    No current facility-administered medications on file prior to visit.      Social and family history reviewed either in EPIC or with patient and are not contributory unless specifically noted in HPI.        No exam.                A/P: Left facial pain, left large maxillary sinus mucous retention cyst. Reviewed/interpreted CT sinuses today. Proceed with left maxillary antrostomy with tissue removal

## 2020-07-29 DIAGNOSIS — K760 Fatty (change of) liver, not elsewhere classified: Secondary | ICD-10-CM | POA: Diagnosis not present

## 2020-07-29 DIAGNOSIS — M9981 Other biomechanical lesions of cervical region: Secondary | ICD-10-CM | POA: Diagnosis not present

## 2020-07-29 DIAGNOSIS — M47812 Spondylosis without myelopathy or radiculopathy, cervical region: Secondary | ICD-10-CM | POA: Diagnosis not present

## 2020-08-01 ENCOUNTER — Telehealth (HOSPITAL_BASED_OUTPATIENT_CLINIC_OR_DEPARTMENT_OTHER): Payer: Self-pay

## 2020-08-01 ENCOUNTER — Telehealth (HOSPITAL_BASED_OUTPATIENT_CLINIC_OR_DEPARTMENT_OTHER): Payer: Self-pay | Admitting: Student in an Organized Health Care Education/Training Program

## 2020-08-01 NOTE — Telephone Encounter (Signed)
TC to [name] regarding MRI abdomen results from [facility]. Results are as followings:   IMPRESSION:         1.NoMRevidenceofsteatosis.   2.3.7cmFNHinhepaticsegmentVIII     Recommend she follows up with her GI specialist at [facility] as she is still having RUQ pain with these findings. She is in agreement. All questions answered.   Lysle Dingwall, MD

## 2020-08-01 NOTE — Telephone Encounter (Signed)
Unable to reach pt by phone. Left a message on voice mail for the pt to return my call so that we can discuss scheduling her surgery.

## 2020-08-07 ENCOUNTER — Encounter (HOSPITAL_BASED_OUTPATIENT_CLINIC_OR_DEPARTMENT_OTHER): Payer: Self-pay | Admitting: Student in an Organized Health Care Education/Training Program

## 2020-08-07 DIAGNOSIS — K580 Irritable bowel syndrome with diarrhea: Secondary | ICD-10-CM

## 2020-08-08 MED ORDER — DOXEPIN HCL 10 MG PO CAPS
10.0000 mg | ORAL_CAPSULE | Freq: Every day | ORAL | 0 refills | Status: DC
Start: 2020-08-08 — End: 2020-11-03

## 2020-08-08 NOTE — Telephone Encounter (Signed)
PER Patient (self), Priscilla Williams is a 50 year old female has requested a refill of     -  DOXEPIN       Last Office Visit: 05/01/2020 with Jhonnie Garner  Last Physical Exam: 09/12/2015    PAP SMEAR due on 01/22/2018  HPV SCREENING due on 01/22/2018    Other Med Adult:  Most Recent BP Reading(s)  05/01/20 : 132/80        Cholesterol (mg/dL)   Date Value   03/26/2019 207     LOW DENSITY LIPOPROTEIN DIRECT (mg/dL)   Date Value   03/26/2019 110     HIGH DENSITY LIPOPROTEIN (mg/dL)   Date Value   03/26/2019 75     TRIGLYCERIDES (mg/dL)   Date Value   03/26/2019 83         THYROID SCREEN TSH REFLEX FT4 (uIU/mL)   Date Value   03/26/2019 0.786         TSH (THYROID STIM HORMONE) (uIU/mL)   Date Value   07/31/2015 1.290       HEMOGLOBIN A1C (%)   Date Value   03/26/2019 5.0       No results found for: POCA1C      No results found for: INR    SODIUM (mmol/L)   Date Value   10/06/2019 138       POTASSIUM (mmol/L)   Date Value   10/06/2019 4.7           CREATININE (mg/dL)   Date Value   10/06/2019 0.6       Documented patient preferred pharmacies:    [facility] - [place], [place]  Phone: [phone] Fax: [phone]

## 2020-08-09 ENCOUNTER — Encounter (HOSPITAL_BASED_OUTPATIENT_CLINIC_OR_DEPARTMENT_OTHER): Payer: Self-pay | Admitting: Student in an Organized Health Care Education/Training Program

## 2020-08-09 DIAGNOSIS — D2271 Melanocytic nevi of right lower limb, including hip: Secondary | ICD-10-CM | POA: Diagnosis not present

## 2020-08-09 DIAGNOSIS — D2371 Other benign neoplasm of skin of right lower limb, including hip: Secondary | ICD-10-CM | POA: Diagnosis not present

## 2020-08-09 DIAGNOSIS — D225 Melanocytic nevi of trunk: Secondary | ICD-10-CM | POA: Diagnosis not present

## 2020-08-09 DIAGNOSIS — D2239 Melanocytic nevi of other parts of face: Secondary | ICD-10-CM | POA: Diagnosis not present

## 2020-08-18 ENCOUNTER — Telehealth (HOSPITAL_BASED_OUTPATIENT_CLINIC_OR_DEPARTMENT_OTHER): Payer: Self-pay | Admitting: Student in an Organized Health Care Education/Training Program

## 2020-08-18 DIAGNOSIS — Z1283 Encounter for screening for malignant neoplasm of skin: Secondary | ICD-10-CM

## 2020-08-18 NOTE — Telephone Encounter (Signed)
Appointment scheduled for:    Specialty Location:   Other:                                                    Specialist's name: Joycelyn Das          Specialist's NPI#: [id number]                           Specialty Phone Number: [phone]     Specialty Fax Number: [phone]    Reason for appointment: mole/skin check    Day of appt:                                     Date of appt: 08/09/2020    Time of appt:    Patient Notification:  Voicemail on referral line      If you have any questions concerning the referral authorization please call [phone].    If you have any questions concerning your appointment please call the specialty phone number above.

## 2020-08-19 ENCOUNTER — Encounter (HOSPITAL_BASED_OUTPATIENT_CLINIC_OR_DEPARTMENT_OTHER): Payer: Self-pay | Admitting: Student in an Organized Health Care Education/Training Program

## 2020-08-25 ENCOUNTER — Telehealth (HOSPITAL_BASED_OUTPATIENT_CLINIC_OR_DEPARTMENT_OTHER): Payer: Self-pay | Admitting: Student in an Organized Health Care Education/Training Program

## 2020-08-25 DIAGNOSIS — G8929 Other chronic pain: Secondary | ICD-10-CM

## 2020-08-25 NOTE — Telephone Encounter (Signed)
Appointment scheduled for: Neurosurgery     Specialty Location:  [facility]                            Specialist's name: Dr Birder Robson    Specialist's NPI#: [id number]                 Specialty Phone Number: [phone]   Specialty Fax Number: [phone]     Reason for appointment: M54.9                     Date of appt: 3.23.22     Patient Notification: Incoming fax

## 2020-09-04 NOTE — Progress Notes (Signed)
ERROR

## 2020-09-13 DIAGNOSIS — K7689 Other specified diseases of liver: Secondary | ICD-10-CM | POA: Diagnosis not present

## 2020-09-13 DIAGNOSIS — R1011 Right upper quadrant pain: Secondary | ICD-10-CM | POA: Diagnosis not present

## 2020-09-13 DIAGNOSIS — M5000 Cervical disc disorder with myelopathy, unspecified cervical region: Secondary | ICD-10-CM | POA: Diagnosis not present

## 2020-09-13 DIAGNOSIS — M5033 Other cervical disc degeneration, cervicothoracic region: Secondary | ICD-10-CM | POA: Diagnosis not present

## 2020-09-20 ENCOUNTER — Encounter (HOSPITAL_BASED_OUTPATIENT_CLINIC_OR_DEPARTMENT_OTHER): Payer: Self-pay | Admitting: Student in an Organized Health Care Education/Training Program

## 2020-09-20 ENCOUNTER — Other Ambulatory Visit (HOSPITAL_BASED_OUTPATIENT_CLINIC_OR_DEPARTMENT_OTHER): Payer: Self-pay | Admitting: Student in an Organized Health Care Education/Training Program

## 2020-09-21 ENCOUNTER — Other Ambulatory Visit (HOSPITAL_BASED_OUTPATIENT_CLINIC_OR_DEPARTMENT_OTHER): Payer: Self-pay | Admitting: Student in an Organized Health Care Education/Training Program

## 2020-09-21 DIAGNOSIS — E559 Vitamin D deficiency, unspecified: Secondary | ICD-10-CM

## 2020-09-21 NOTE — Telephone Encounter (Signed)
PER Pharmacy, [name] is a 50 year old female has requested a refill of     -  VITAMIN D       Last Office Visit: 05/01/2020 with Jhonnie Garner  Last Physical Exam: 09/12/2015    PAP SMEAR due on 01/22/2018  HPV SCREENING due on 01/22/2018    Other Med Adult:  Most Recent BP Reading(s)  05/01/20 : 132/80        Cholesterol (mg/dL)   Date Value   03/26/2019 207     LOW DENSITY LIPOPROTEIN DIRECT (mg/dL)   Date Value   03/26/2019 110     HIGH DENSITY LIPOPROTEIN (mg/dL)   Date Value   03/26/2019 75     TRIGLYCERIDES (mg/dL)   Date Value   03/26/2019 83         THYROID SCREEN TSH REFLEX FT4 (uIU/mL)   Date Value   03/26/2019 0.786         TSH (THYROID STIM HORMONE) (uIU/mL)   Date Value   07/31/2015 1.290       HEMOGLOBIN A1C (%)   Date Value   03/26/2019 5.0       No results found for: POCA1C      No results found for: INR    SODIUM (mmol/L)   Date Value   10/06/2019 138       POTASSIUM (mmol/L)   Date Value   10/06/2019 4.7           CREATININE (mg/dL)   Date Value   10/06/2019 0.6       Documented patient preferred pharmacies:      [id number] [place] Phone: [phone]: [phone]

## 2020-09-23 ENCOUNTER — Encounter (HOSPITAL_BASED_OUTPATIENT_CLINIC_OR_DEPARTMENT_OTHER): Payer: Self-pay | Admitting: Student in an Organized Health Care Education/Training Program

## 2020-09-23 DIAGNOSIS — E559 Vitamin D deficiency, unspecified: Secondary | ICD-10-CM

## 2020-09-24 ENCOUNTER — Encounter (HOSPITAL_BASED_OUTPATIENT_CLINIC_OR_DEPARTMENT_OTHER): Payer: Self-pay | Admitting: Student in an Organized Health Care Education/Training Program

## 2020-10-11 ENCOUNTER — Encounter (HOSPITAL_BASED_OUTPATIENT_CLINIC_OR_DEPARTMENT_OTHER): Payer: Self-pay | Admitting: Student in an Organized Health Care Education/Training Program

## 2020-10-11 DIAGNOSIS — I10 Essential (primary) hypertension: Secondary | ICD-10-CM

## 2020-10-12 MED ORDER — LISINOPRIL 20 MG PO TABS
20.0000 mg | ORAL_TABLET | Freq: Every day | ORAL | 0 refills | Status: DC
Start: 2020-10-12 — End: 2020-11-03

## 2020-10-12 NOTE — Telephone Encounter (Signed)
PER Patient (self), Priscilla Williams is a 50 year old female has requested a refill of Lisinopril.    This refill request failed protocol because    Normal serum potassium in past 12 months    eGFR > 55 in past year     Patient needs updated labs. Sending 30 day 0 refill per protocol orders. Front desk to schedule appt.    Last Office Visit: 05/01/20 with Ribourg, S  Last Physical Exam: NA    PAP SMEAR due on 01/22/2018  HPV SCREENING due on 01/22/2018    HTN Med:    Most Recent BP Reading(s)  05/01/20 : 132/80  04/09/19 : 136/88  03/26/19 : (!) 146/96      Documented patient preferred pharmacies:    [place], [place]  Phone: [phone] Fax: [phone]    [id number] [place] [address] Phone: [phone]: [phone]

## 2020-10-20 DIAGNOSIS — Z01411 Encounter for gynecological examination (general) (routine) with abnormal findings: Secondary | ICD-10-CM | POA: Diagnosis not present

## 2020-10-20 DIAGNOSIS — Z803 Family history of malignant neoplasm of breast: Secondary | ICD-10-CM | POA: Diagnosis not present

## 2020-10-20 DIAGNOSIS — N944 Primary dysmenorrhea: Secondary | ICD-10-CM | POA: Diagnosis not present

## 2020-10-20 DIAGNOSIS — N924 Excessive bleeding in the premenopausal period: Secondary | ICD-10-CM | POA: Diagnosis not present

## 2020-10-20 DIAGNOSIS — Z01419 Encounter for gynecological examination (general) (routine) without abnormal findings: Secondary | ICD-10-CM | POA: Diagnosis not present

## 2020-10-22 HISTORY — PX: MAXILLARY ANTROSTOMY: SHX2003

## 2020-10-24 ENCOUNTER — Encounter (HOSPITAL_BASED_OUTPATIENT_CLINIC_OR_DEPARTMENT_OTHER): Payer: Self-pay

## 2020-10-24 ENCOUNTER — Encounter (HOSPITAL_BASED_OUTPATIENT_CLINIC_OR_DEPARTMENT_OTHER): Payer: Self-pay | Admitting: Otolaryngology

## 2020-10-24 ENCOUNTER — Encounter (HOSPITAL_BASED_OUTPATIENT_CLINIC_OR_DEPARTMENT_OTHER)

## 2020-10-24 ENCOUNTER — Ambulatory Visit
Admission: RE | Admit: 2020-10-24 | Discharge: 2020-10-24 | Disposition: A | Discharge status: Home / Self Care | Source: Home / Self Care | Attending: Emergency Medicine | Admitting: Emergency Medicine

## 2020-10-24 ENCOUNTER — Other Ambulatory Visit

## 2020-10-24 ENCOUNTER — Encounter

## 2020-10-24 ENCOUNTER — Ambulatory Visit (HOSPITAL_BASED_OUTPATIENT_CLINIC_OR_DEPARTMENT_OTHER)

## 2020-10-24 VITALS — BP 154/89 | HR 97 | Temp 98.2°F | Resp 16 | Ht 60.0 in | Wt 218.0 lb

## 2020-10-24 DIAGNOSIS — G8911 Acute pain due to trauma: Secondary | ICD-10-CM | POA: Diagnosis not present

## 2020-10-24 DIAGNOSIS — S99921A Unspecified injury of right foot, initial encounter: Secondary | ICD-10-CM | POA: Diagnosis not present

## 2020-10-24 DIAGNOSIS — M25571 Pain in right ankle and joints of right foot: Secondary | ICD-10-CM | POA: Diagnosis not present

## 2020-10-24 DIAGNOSIS — M79671 Pain in right foot: Secondary | ICD-10-CM | POA: Diagnosis not present

## 2020-10-24 NOTE — Surgery Pre-Op (Signed)
PAT Visit          Priscilla Williams is a 50 year old female received a preoperative screening.        Appointments for Next 30 Days 10/24/2020 - 11/23/2020              Date Visit Type Department Provider     10/26/2020 10:00 AM PRE-ADMISSION [facility] PHONE SCREEN    Patient Instructions:     On this date, you will receive a phone call from a [facility] nurse to discuss your upcoming procedure. You do not need to come to the hospital for this appointment               11/08/2020 11:00 AM PROCEDURE [facility] Rondel Jumbo, MD    Patient Instructions:                   Surgery Information     Future Procedures (Tomorrow to 10/24/2021)       Date Time Procedures Providers Location Status        11/02/2020 0810 ENDOSCOPIC SINUS SURGERY, USING COMPUTER-ASSISTED NAVIGATION - LeftMAXILLARY ANTROSTOMY - Left [place], Benjamine [facility] OR Scheduled Open case     Procedures: ENDOSCOPIC SINUS SURGERY, USING COMPUTER-ASSISTED NAVIGATION - LeftMAXILLARY ANTROSTOMY - Left                      Procedure(s):  ENDOSCOPIC SINUS SURGERY, USING COMPUTER-ASSISTED NAVIGATION  MAXILLARY ANTROSTOMY  11/02/2020        .     Language of care:English    Allergy History  Patient has no known allergies.      Past Medical History:  No date: Anemia  No date: Headache  No date: History of hypertension  No date: IBS (irritable bowel syndrome)  03/22/2010: Nasal polyps  No date: Obesity     has a past surgical history that includes Cervical fusion.         Alcohol, Tobacco and Drug History:  Social History    Tobacco Use      Smoking status: Never Smoker      Smokeless tobacco: Never Used    E-Cigarette/Vaping  E-Cigarette Use: Never User  Quit Date:   Nicotine:   Start Date:   THC:    Cartridges/Day:   CBD:   Other Substance:   Flavoring:       Alcohol use Yes   Comment: 1 drink every 6 months, wine       Drug use: No       Extended Emergency Contact  Information  Primary Emergency Contact: Waters,DAVID  Address: [place], [place] [address] [place] Phone: [phone]  Work Phone: [phone]  Relation: Husband  Secondary Emergency Contact: Lenk,DAVID  Address: [address]           [place], [place] [address]  Home Phone: [phone]  Work Phone: [phone]  Relation: Husband        Current Outpatient Medications   Medication Sig    lisinopril (ZESTRIL) 20 MG tablet Take 1 tablet by mouth daily    D3-1000 25 MCG (1000 UT) tablet TAKE 1 TABLET BY MOUTH EVERY DAY    doxepin (SINEQUAN) 10 MG capsule Take 1 capsule by mouth daily     No current facility-administered medications for this encounter.         (  Not in a hospital admission)      PAT Assessment    Airways WDL     Activity tolerance (How many flights of stairs): 2    Assistive Device: None           PAT Screening     Row Name 10/24/20 1456       Integumentary - Complete full head to toe skin assessment    Integumentary (WDL) WDL    Row Name 10/24/20 1455       STOP-Bang Questionaire     Do you snore loudly (loud enough to be heard through closed doors or   your bed-partner elbows you for snoring at night? 0    Do you often feel Tired, fatigued, or Sleepy during the daytime (such as   falling asleep when driving)? 0    Has anyone observed you stop breathing, or choking/gasping during your   sleep? 0    Do you have, or are being treated for, High Blood Pressure? 1    Height 5' (1.524 m)    Weight 98.9 kg (218 lb)    BMI (Calculated) 42.66    Neck size large? 0    Stop-Bang Score Low                       10/24/20  1455   Weight: 98.9 kg (218 lb)   Height: 5' (1.524 m)           Patient issues currently are  Pre op and alll questions answered and pt needs covid test so she will call office for appt in [place].   Medication instructions  And . AVS  Reviewed with  patient.         Gwendel Hanson, RN

## 2020-10-24 NOTE — Discharge Instructions (Signed)
SURGICAL DAY CARE PRE-OPERATIVE INSTRUCTIONS    Before your Surgery    COVID-19 Instructions:  Call surgeon office for covid test appt     Unvaccinated Patients  You need to get tested for COVID before your surgery. This will be at our testing site at [facility]   Please follow social distancing after your COVID test. You need to stay isolated as much as possible until your surgery. Please tell your surgeon if:  ? you get a fever or other symptoms of illness  ? you, or someone you live with, is exposed to a person with COVID or someone who is sick      s.    The Day of Surgery  You should have someone drop you off at the hospital at the right time. Please remember to wear a face mask.  When you get here, please go to the registration area of the hospital.     When to [facility] on Thursday, May  12th  at (Time):630 am      Currently, visitors cannot wait for you in the hospital during surgery. This is to protect all our patients. Please talk to [place] if you are a parent with a patient under 18 or a caregiver for a disabled patient.     You can not drive after surgery. You must have an adult pick you up and bring you home. Your ride should plan to pick you up outside the hospital.      We will call your ride to let them know when you can leave and where to meet you. They should call the recovery room (we will give you the number) when they arrive. We will bring you out to meet them.      We suggest you plan to have someone help you at home after your surgery.     Other Instructions:      The night before your surgery, please do not eat or drink anything after midnight. You can not even have water, gum or hard candy.       Do not smoke the morning of your surgery.      Please leave all valuables at home. This includes jewelry, watches, and money.      Please take off fingernail polish before coming to the hospital. Do not wear any face or lip make-up.      If you are having eye  surgery, do not wear any eye or face makeup. Avoid facial moisturizers and perfumes.      Please take out any hair extensions that you can remove.      Do not shave your surgical site.      Do not wear contact lenses.     Medicine:     Take the following medicine the night before surgery at bedtime: usual meds      Take the following medicine the morning of your surgery with only a sip of water: none

## 2020-10-24 NOTE — Discharge Instructions (Signed)
You were seen in the urgent care clinic today for evaluation of right foot pain after an injury.  Your x-rays today did not show any acute fracture or dislocation.  Your pain is likely secondary to sprain or soft tissue inflammation.  You should rest the foot, wear the hard soled shoe for support, elevate, ice in 20-minute increments and use Tylenol over-the-counter 1000 mg 3 times a day as needed for pain.

## 2020-10-24 NOTE — ED Provider Notes (Signed)
History  Chief Complaint   Patient presents with   ? RIGHT FOOT PAIN S/P FALL     Patient is a 50 year old female who presents to the urgent care clinic for evaluation.  Patient states that a few days ago she accidentally tripped and fell, states she believes that her right foot got caught and she fell forward.  Since then patient has had pain in the right foot, mostly along the plantar aspect diffusely.  Pain is worse with movement and weightbearing.  No ankle pain.  No numbness or tingling.  Patient denies continued pain elsewhere from the fall.          History reviewed. No pertinent past medical history.    History reviewed. No pertinent surgical history.    No family history on file.    Social History     Tobacco Use   ? Smoking status: Not on file   ? Smokeless tobacco: Not on file   Substance Use Topics   ? Alcohol use: Not on file   ? Drug use: Not on file       Review of Systems   Constitutional: Negative for chills and fever.       Physical Exam  ED Triage Vitals [10/24/20 1841]   Temp Pulse Resp BP   36.8 ?C (98.2 ?F) 97 16 (!) 154/89      SpO2 Temp Source Heart Rate Source Patient Position   98 % Oral Monitor --      BP Location FiO2 (%)     Left arm --         Physical Exam  Vitals reviewed.   Constitutional:       General: She is not in acute distress.     Appearance: She is not ill-appearing.   HENT:      Head: Normocephalic and atraumatic.   Eyes:      General: No scleral icterus.  Pulmonary:      Effort: No respiratory distress.   Musculoskeletal:      Comments: There is no obvious deformity of the right foot.  No bruising or swelling.  Patient has full active range of motion of the ankle without pain or difficulty.  No ankle tenderness.  There is diffuse tenderness of the plantar aspect of the foot, most notably over the arch of the foot and heel.  Mild swelling without erythema or warmth.  No palpable deformity.  DP pulse 2+.  Sensation intact to light touch distally.  Capillary refill less than 2  seconds.   Neurological:      Mental Status: She is alert.              No orders to display     Labs Reviewed - No data to display    Procedures  Procedures    UC Course  Diagnoses as of 10/24/20 2221   Injury of right foot, initial encounter       MDM  Number of Diagnoses or Management Options  Injury of right foot, initial encounter  Diagnosis management comments: She was interviewed and examined and the above x-rays were ordered by nursing triage and obtained.  X-rays are negative.  Right foot neurovascularly intact.  Suspect acute sprain versus strain versus other soft tissue injury or inflammation.  Plan will be for conservative management and outpatient follow-up with orthopedist or podiatrist.  Patient is provided with a postop shoe, advised to rest, elevate, ice in 20-minute increments, Tylenol as directed, as needed for  pain, follow with orthopedist or podiatrist and return sooner with any concerns or worsening symptoms.  Patient is in agreement with this plan.      Discharge Meds  ED Prescriptions     None          Home Meds  Prior to Admission medications    Medication Sig Start Date End Date Taking? Authorizing Provider   doxepin (SINEquan) 10 mg capsule Take 10 mg by mouth at bedtime. 08/08/20   Nurse [facility], RN   lisinopril 20 mg tablet Take 20 mg by mouth in the morning. 10/12/20   Nurse [facility], RN   Vitamin D3 25 mcg (1,000 unit) tablet Take 1,000 Units by mouth in the morning. 09/28/20   Nurse [facility], RN                          Luisa Hart, PA  10/24/20 2223

## 2020-10-25 ENCOUNTER — Telehealth (HOSPITAL_BASED_OUTPATIENT_CLINIC_OR_DEPARTMENT_OTHER): Payer: Self-pay

## 2020-10-25 NOTE — Telephone Encounter (Signed)
Left message on patients voice mail for the pt to call me to discuss her surgery details. Requested a call back.

## 2020-10-26 ENCOUNTER — Ambulatory Visit
Admission: RE | Admit: 2020-10-26 | Discharge: 2020-10-26 | Disposition: A | Discharge status: Home / Self Care | Payer: BC Managed Care – HMO

## 2020-10-26 HISTORY — DX: Personal history of other diseases of the circulatory system: Z86.79

## 2020-10-30 ENCOUNTER — Ambulatory Visit (HOSPITAL_BASED_OUTPATIENT_CLINIC_OR_DEPARTMENT_OTHER): Payer: BC Managed Care – HMO

## 2020-10-31 ENCOUNTER — Ambulatory Visit
Admission: RE | Admit: 2020-10-31 | Discharge: 2020-10-31 | Disposition: A | Discharge status: Home / Self Care | Payer: BC Managed Care – HMO | Attending: Family Medicine | Admitting: Family Medicine

## 2020-10-31 DIAGNOSIS — Z20822 Contact with and (suspected) exposure to covid-19: Secondary | ICD-10-CM | POA: Diagnosis present

## 2020-10-31 LAB — COVID-19 OUTPATIENT: COVID-19 OUTPATIENT: NEGATIVE

## 2020-11-01 ENCOUNTER — Telehealth (HOSPITAL_BASED_OUTPATIENT_CLINIC_OR_DEPARTMENT_OTHER): Payer: Self-pay

## 2020-11-01 NOTE — Telephone Encounter (Signed)
Left patient a message that her time of arrival for tomorrows surgery with Dr. Marvetta Gibbons has now been moved. New time of arrival is at 11am. Left detailed message.

## 2020-11-01 NOTE — Anesthesia Preprocedure Evaluation (Addendum)
Pre-Anesthetic Note  .      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     Current Outpatient Medications   Medication Sig    lisinopril (ZESTRIL) 20 MG tablet Take 1 tablet by mouth daily    D3-1000 25 MCG (1000 UT) tablet TAKE 1 TABLET BY MOUTH EVERY DAY    doxepin (SINEQUAN) 10 MG capsule Take 1 capsule by mouth daily         Allergies:   Review of Patient's Allergies indicates:  No Known Allergies    Smoking, Alcohol, Drugs:  Social History    Tobacco Use      Smoking status: Never Smoker      Smokeless tobacco: Never Used    Alcohol use: Yes      Comment: 1 drink every 6 months, wine      Drug use: No       PMHx:  Past Medical History:  No date: Anemia  No date: Headache  No date: History of hypertension  No date: IBS (irritable bowel syndrome)  03/22/2010: Nasal polyps  No date: Obesity    Vitals  There were no vitals taken for this visit.    Review of Systems     Patient summary reviewed and Nursing notes reviewed      Anesthetic History:   Positive IZT:IWPY           Cardiovascular: Positive for hypertension.   Physical Activity in METs greater than 4    Pulmonary: Negative for obstructive sleep apnea (Morbid obesity). Lung nodule           GU/Renal: Negative for GU/renal diseases.    Hepatic: Positive for other  liver disease (Hepatic steatosis). Fatty liver dz  Neurological: Positive for headaches (Migraine).        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 Pt denies any symptoms from it however states she does get HAs  "    1.Noacuteintracranialabnormality.  2.UnchangedbulbouscontouroftheleftMCAterminusmeasuring3.8x2.51m,  possiblyonthebasisofawide-neckaneurysm.Nonewaneurysmidentified.  3.MinimalatheromawithintherightICAophthalmicsegment.Remainderofthe  circle-of-Willisispatentwithoutevidenceofstenosisorocclusion.  4.Patentbilateralcervicalcarotidandvertebralarterieswithoutevidence  ofstenosis,occlusion,ordissection.  5.EnlargementofbilateralmaxillarymucousretentioncystssinceCT15  November2019withnearcompleteopacificationoftheleftmaxillarysinus.  6.Stable652mround-glassnoduleattheleftapex.PersistencesinceCT5  November2019raisesconcernforadenocarcinomain-situ.Follow-upCTchest  in2yearsisrecommendedaccordingtocurrentFleischnerguidelines.    "   Gastrointestinal:        IBS     Hematological: Positive for anemia.   Endocrine: Negative for endocrine diseases/disorders.    HEENT:     [facility], was  a fiberoptic intubation  Head:  Positive for neck pain (C-spine OA with cervical myelopathy).   Nose:  Positive for congestion (Nasal polyps) and sinus pressure (Maxillary sinus cyst).   Psychiatric: Negative for psychiatry diseases.    Constitutional: Negative for constitutional diseases.    Skin: Negative for skin diseases.    Other:BMI greater than 40 kg/m2      Physical Exam    General     Level of consciousness:  Alert and oriented (time, person, place)   no respiratory distress syndrome   Airway     Mallampati:  II    TM distance:  >3 FB     Mouth opening:  <3 FB    Neck ROM:  Mildly limited   Teeth  - normal exam  }   Heart   - normal exam     Lungs - normal exam               Pertinent Labs:   Lab Results   Component Value Date    NA 138 10/06/2019    K 4.7 10/06/2019    CREAT 0.6 10/06/2019    GLUCOSER 89 10/06/2019    WBC 7.5 03/26/2019    HCT 42.8 03/26/2019    PLTA 294 03/26/2019     EKG REPORT 03/26/2019   Test Reason : Dizziness    Blood Pressure : mmHG    Vent. Rate : 069 BPM   Atrial Rate : 069 BPM     P-R Int : 126 ms     QRS Dur : 094 ms      QT Int : 396 ms    P-R-T Axes : 003 001 008 degrees     QTc Int : 424 ms        Normal sinus rhythm    Normal ECG    When compared with ECG of 11-Apr-2014 12:14,    No significant change was found        Referred By: Berdine Addison      Confirmed NG:EXBMW Brooks         Anesthesia Plan    ASA Score:     ASA:  3    Airway:      Mallampati:  II    Mouth opening:  <3 FB    Neck ROM:  Mildly limited    TM distance:  >3 FB    Plan: general    Other information:     EKG Reviewed: : Yes      Full Stomach Precaution:: No      Post-Plan::  PACU    Anesthesia Assessment and Plan:        GA/ET. May need videolaryngoscopy. PONV prophy    Informed Consent:     Anesthetic plan and risks discussed with:  Patient   Patient Consented        Attending Anesthesiologist Statement:     Reassessed day of surgery: Yes        Assessment made, necessary equipment and appropriate plan in place.

## 2020-11-02 ENCOUNTER — Ambulatory Visit (HOSPITAL_BASED_OUTPATIENT_CLINIC_OR_DEPARTMENT_OTHER): Payer: BC Managed Care – HMO | Admitting: Anesthesiology

## 2020-11-02 ENCOUNTER — Encounter (HOSPITAL_BASED_OUTPATIENT_CLINIC_OR_DEPARTMENT_OTHER): Payer: Self-pay | Admitting: Otolaryngology

## 2020-11-02 ENCOUNTER — Telehealth (HOSPITAL_BASED_OUTPATIENT_CLINIC_OR_DEPARTMENT_OTHER): Payer: Self-pay

## 2020-11-02 ENCOUNTER — Ambulatory Visit (HOSPITAL_BASED_OUTPATIENT_CLINIC_OR_DEPARTMENT_OTHER)
Admission: RE | Disposition: A | Discharge status: Home / Self Care | Payer: Self-pay | Source: Ambulatory Visit | Attending: Otolaryngology

## 2020-11-02 ENCOUNTER — Ambulatory Visit
Admission: RE | Admit: 2020-11-02 | Discharge: 2020-11-02 | Disposition: A | Discharge status: Home / Self Care | Payer: BC Managed Care – HMO | Attending: Otolaryngology | Admitting: Otolaryngology

## 2020-11-02 DIAGNOSIS — J339 Nasal polyp, unspecified: Secondary | ICD-10-CM | POA: Diagnosis not present

## 2020-11-02 DIAGNOSIS — J320 Chronic maxillary sinusitis: Secondary | ICD-10-CM

## 2020-11-02 DIAGNOSIS — J341 Cyst and mucocele of nose and nasal sinus: Secondary | ICD-10-CM | POA: Diagnosis not present

## 2020-11-02 DIAGNOSIS — I10 Essential (primary) hypertension: Secondary | ICD-10-CM

## 2020-11-02 LAB — URINE PREGNANCY TEST (POINT OF CARE): HCG QUALITATIVE URINE: NEGATIVE

## 2020-11-02 SURGERY — SINUS SURGERY, ENDOSCOPIC, USING COMPUTER-ASSISTED NAVIGATION
Anesthesia: General | Site: Nose | Laterality: Left | Wound class: Class II/ Clean Contaminated

## 2020-11-02 MED ORDER — FENTANYL CITRATE 0.05 MG/ML IJ SOLN
INTRAMUSCULAR | Status: AC
Start: 2020-11-02 — End: 2020-11-02
  Filled 2020-11-02: qty 2

## 2020-11-02 MED ORDER — FENTANYL CITRATE 0.05 MG/ML IJ SOLN
25.0000 ug | INTRAMUSCULAR | Status: DC | PRN
Start: 2020-11-02 — End: 2020-11-02
  Administered 2020-11-02 (×3): 25 ug via INTRAVENOUS
  Filled 2020-11-02: qty 2

## 2020-11-02 MED ORDER — EPINEPHRINE HCL 1 MG/ML INJECTION
INTRAMUSCULAR | Status: AC
Start: 2020-11-02 — End: 2020-11-02
  Administered 2020-11-02: 1 mg
  Filled 2020-11-02: qty 30

## 2020-11-02 MED ORDER — PROPOFOL 200 MG/20 ML IV - AN
Freq: Once | INTRAVENOUS | Status: DC | PRN
Start: 2020-11-02 — End: 2020-11-02
  Administered 2020-11-02: 200 mg via INTRAVENOUS

## 2020-11-02 MED ORDER — ROCURONIUM BROMIDE 100 MG/10ML IV SOLN
INTRAVENOUS | Status: AC
Start: 2020-11-02 — End: 2020-11-02
  Filled 2020-11-02: qty 10

## 2020-11-02 MED ORDER — SUCCINYLCHOLINE CHLORIDE 200 MG/10ML IV SOSY
PREFILLED_SYRINGE | INTRAVENOUS | Status: AC
Start: 2020-11-02 — End: 2020-11-02
  Filled 2020-11-02: qty 10

## 2020-11-02 MED ORDER — FLUORESCEIN SODIUM 1 MG OP STRP
ORAL_STRIP | OPHTHALMIC | Status: AC
Start: 2020-11-02 — End: 2020-11-02
  Administered 2020-11-02: 1
  Filled 2020-11-02: qty 1

## 2020-11-02 MED ORDER — SCOPOLAMINE 1 MG/3DAYS TD PT72
1.0000 | MEDICATED_PATCH | TRANSDERMAL | Status: DC
Start: 2020-11-02 — End: 2020-11-02
  Administered 2020-11-02: 1 via TRANSDERMAL
  Filled 2020-11-02: qty 1

## 2020-11-02 MED ORDER — LIDOCAINE-EPINEPHRINE 1 %-1:200000 IJ SOLN
INTRAMUSCULAR | Status: DC
Start: 2020-11-02 — End: 2020-11-02
  Filled 2020-11-02: qty 30

## 2020-11-02 MED ORDER — LACTATED RINGERS IV SOLN
INTRAVENOUS | Status: DC
Start: 2020-11-02 — End: 2020-11-02
  Administered 2020-11-02: 1000 mL via INTRAVENOUS

## 2020-11-02 MED ORDER — PROPOFOL 200 MG/20ML IV EMUL
INTRAVENOUS | Status: AC
Start: 2020-11-02 — End: 2020-11-02
  Filled 2020-11-02: qty 40

## 2020-11-02 MED ORDER — ONDANSETRON HCL 4 MG/2ML IJ SOLN
Freq: Once | INTRAMUSCULAR | Status: DC | PRN
Start: 2020-11-02 — End: 2020-11-02
  Administered 2020-11-02: 4 mg via INTRAVENOUS

## 2020-11-02 MED ORDER — OXYCODONE HCL 5 MG PO TABS
5.00 mg | ORAL_TABLET | Freq: Three times a day (TID) | ORAL | 0 refills | Status: AC | PRN
Start: 2020-11-02 — End: 2020-11-09

## 2020-11-02 MED ORDER — DEXAMETHASONE SODIUM PHOSPHATE 4 MG/ML IJ SOLN
Freq: Once | INTRAMUSCULAR | Status: DC | PRN
Start: 2020-11-02 — End: 2020-11-02
  Administered 2020-11-02: 8 mg via INTRAVENOUS

## 2020-11-02 MED ORDER — PROPOFOL 500 MG/50 ML IV
INTRAVENOUS | Status: DC | PRN
Start: 2020-11-02 — End: 2020-11-02
  Administered 2020-11-02: 75 ug/kg/min via INTRAVENOUS
  Administered 2020-11-02: 100 ug/kg/min via INTRAVENOUS

## 2020-11-02 MED ORDER — LIDOCAINE HCL (PF) 2 % IJ SOLN
INTRAMUSCULAR | Status: AC
Start: 2020-11-02 — End: 2020-11-02
  Filled 2020-11-02: qty 5

## 2020-11-02 MED ORDER — LIDOCAINE HCL (PF) 2 % IJ SOLN
Freq: Once | INTRAMUSCULAR | Status: DC | PRN
Start: 2020-11-02 — End: 2020-11-02
  Administered 2020-11-02: 50 mg via INTRAVENOUS

## 2020-11-02 MED ORDER — SUCCINYLCHOLINE CHLORIDE 20 MG/ML IJ SOLN
Freq: Once | INTRAMUSCULAR | Status: DC | PRN
Start: 2020-11-02 — End: 2020-11-02
  Administered 2020-11-02: 100 mg via INTRAVENOUS

## 2020-11-02 MED ORDER — MIDAZOLAM HCL 2 MG/2 ML IJ SOLN
INTRAMUSCULAR | Status: AC
Start: 2020-11-02 — End: 2020-11-02
  Filled 2020-11-02: qty 2

## 2020-11-02 MED ORDER — OXYMETAZOLINE HCL 0.05 % NA SOLN
NASAL | Status: DC
Start: 2020-11-02 — End: 2020-11-02
  Filled 2020-11-02: qty 30

## 2020-11-02 MED ORDER — ONDANSETRON HCL 4 MG/2ML IJ SOLN
INTRAMUSCULAR | Status: AC
Start: 2020-11-02 — End: 2020-11-02
  Filled 2020-11-02: qty 2

## 2020-11-02 MED ORDER — MIDAZOLAM HCL 2 MG/2 ML IJ SOLN
Freq: Once | INTRAMUSCULAR | Status: DC | PRN
Start: 2020-11-02 — End: 2020-11-02
  Administered 2020-11-02: 2 mg via INTRAVENOUS

## 2020-11-02 MED ORDER — DEXAMETHASONE SODIUM PHOSPHATE 4 MG/ML IJ SOLN
INTRAMUSCULAR | Status: AC
Start: 2020-11-02 — End: 2020-11-02
  Filled 2020-11-02: qty 2

## 2020-11-02 MED FILL — OXYCODONE 5MG: 4 days supply | Qty: 12 | Fill #0 | Status: CP

## 2020-11-02 SURGICAL SUPPLY — 20 items
.9% NACL IRRIGATION 500ML (SOLUTION) ×3 IMPLANT
BASIC SINGLE BASIN TRAY (BASIN) ×3 IMPLANT
CHROMIC GUT SUT 4-0 CV23 30 (SUTURE) ×2
CRADLE ARM FOAM POSITIONER TLC (POSITION) ×3 IMPLANT
DOYLE II NASAL SEPTAL SPLINT (ENT) ×3 IMPLANT
DRESSING SPONGE 4X4 (DRESSING) ×3 IMPLANT
FRAZIER SUCTION 8FR (SUCTION) ×3 IMPLANT
MAGNETIC DRAPE LARGE 16"X20" (SURGEQUP) ×3 IMPLANT
PACK ENT (PACK) ×3 IMPLANT
SINGLE USE STERIL PADS ADHERSI (ENDOFLD) ×3 IMPLANT
SPECIMEN CONTAINER 4 OZ (SPECIMEN) ×6 IMPLANT
SPONGE 1X3 CTTND PTT 1 STNG (SURGSPG) ×1 IMPLANT
SPONGE 3X.5IN CTTND PTT 1 STNG (SURGSPG) ×3
SPONGE,8X4,XRAY,RFID (DRESSING) ×3 IMPLANT
SUCTION COAGULATOR 6IN 10FR (CAUTERY) ×3 IMPLANT
SUCTION TUBING 20' (SUCTION) ×3 IMPLANT
SURGEON BLADE #15 (SURGBLA) ×3 IMPLANT
SURGEON GLOVE PF/LF 6.5 STER (GLOVE) ×6 IMPLANT
SUTURE CHROMIC 4-0 CV23 30IN (SUTURE) ×1 IMPLANT
SYRINGE 30CC LL (SYRINGE) ×3 IMPLANT

## 2020-11-02 NOTE — Addendum Note (Signed)
Addendum  created 11/02/20 1533 by Bertrum Sol, MD    LDA properties accepted

## 2020-11-02 NOTE — Op Note (Addendum)
Date of Operation: 11/02/2020      PREOPERATIVE DIAGNOSIS:  Left maxillary sinus mass/chronic sinusitis.     POSTOPERATIVE DIAGNOSIS:  Left maxillary sinus mass/chronic sinusitis.     PROCEDURE:  CT-guided endoscopic left maxillary antrostomy with tissue   removal.     FINDINGS: Mucus retention cyst in the left maxillary sinus with extension into   the left nasal cavity with polypoid component.     ANESTHESIA:  General.     COMPLICATIONS:  None.     ESTIMATED BLOOD LOSS:  5 mL     CONDITION:  Stable to PACU.     INDICATIONS FOR PROCEDURE:  This is a 50 year old female with a history of   left facial pain and nasal congestion as well as near complete opacification   of the left maxillary sinus on CT imaging, who presented to undergo the above   procedure.  The risks, benefits and alternatives were discussed with the   patient.  All questions were answered and informed consent was obtained and   documented.     DESCRIPTION OF PROCEDURE:  The patient was brought into the operating room and   placed supine on the operating room table.  General anesthesia was induced   and the patient was orotracheally intubated.  The CT guidance system was set   up and utilized throughout the case.  The patient was prepped and draped in   the standard fashion.  Epinephrine-soaked pledgets were placed into the left   naris for decongestion.  These were removed and a 0-degree endoscope was used   to visualize the left nasal cavity.  The middle turbinate was medialized with   a Freer.  The uncinate process was identified and removed with a backbiter.  A   large mucous retention cyst was seen emanating from the left maxillary sinus   and partially filling the left posterior nasal cavity with a combination of   cystic material and polypoid material.  The polypoid cystic material in the   nasal cavity was removed first.  Thereafter, the mucous retention cyst in the   left maxillary sinus was grasped and removed.  There was no purulence    encountered.  There was no bleeding at the end of the case.  The patient was   awakened and extubated in the operating room, having tolerated the procedure   well and was transported to recovery in stable condition.                                                             Reviewed and Electronically Signed By: Rondel Jumbo, MD  Sig Date: 11/10/2020  Sig Time: 09:40:55  Dictated By: Rondel Jumbo, MD  Dict Date: 11/02/2020 Dict Time: 02:06 PM      Dictation Date and Time:  11/02/2020 14:06:08  Transcription Date and Time:  11/02/2020 14:12:00   eScription Dictation id: [id number]

## 2020-11-02 NOTE — Telephone Encounter (Signed)
I called the patient and Left a voicemail to return call    If patient calls back, please: Book the appointment per the following guidance: covid test needed prior to 05/18 ENT appointment.

## 2020-11-02 NOTE — Anesthesia Postprocedure Evaluation (Signed)
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])    Surgeons: Rondel Jumbo, MD Responsible Provider: Bertrum Sol, MD    Anesthesia Type: general ASA Status: 3            POST-OPERATIVE EVALUATION    Anesthesia Post Evaluation    Vitals signs in patient's normal range: Yes  Respiratory function stable; airway patent: Yes  Cardiovascular function stable: Yes  Hydration status stable: Yes  Mental status recovered; patient participates in evaluation and/or is at baseline: Yes  Pain control satisfactory: Yes  Nausea and vomiting control satisfactory: Yes    Procedure was labor & delivery no  PostOP disposition PACU  Anesthesia Observation no significant observation      Last vitals  Vitals Value Taken Time   BP 148/83 11/02/20 1418   Temp 98.2 11/02/20 1418   Pulse 92 11/02/20 1418   Resp 14 11/02/20 1418   SpO2 100 11/02/20 1418

## 2020-11-02 NOTE — H&P (Signed)
Copied from office note 12/21 with updates as below:    Progress Notes  Rondel Jumbo, MD (Physician)   Ent-Otolaryngology  Expand All Collapse All  HPI: 50 year old female Priscilla Williams complains of left maxillary sinusitis  Had some dizziness, headache and sometimes pressure in the left cheek for months  Had CTA done at [facility] for dizziness which has since gone away  I reviewed and independently interpreted the CTA head recently performed which shows near complete opacification of left maxillary sinus, small mucous retention cyst vs polyp in right max sinus  No nasal blockage  No nasal discharge  No hx allergies  Pt states she was told in the past she must have a nasal polyp by her PCP and was given a spray but has also seen an ENT long ago who did a scope and saw one then      In the past has blown her nose and that led to drainage from the left nostril which was surprising in quantity  She wakes up with headaches on left side of her face and head at least twice a week  Also has hx migraine                Past Medical History:     No date: Anemia  No date: Headache  No date: IBS (irritable bowel syndrome)  03/22/2010: Nasal polyps  No date: Obesity         Review of Patient's Allergies indicates:     No Known Allergies         doxepin (SINEQUAN) 10 MG capsule, TAKE ONE CAPSULE BY MOUTH DAILY, Disp: 90 capsule, Rfl: 0     butalbital-acetaminophen-caffeine (FIORICET) per tablet, Take 1 tablet by mouth every 6 (six) hours as needed for Pain, Disp: 12 tablet, Rfl: 0  lisinopril (ZESTRIL) 20 MG tablet, Take 1 tablet by mouth daily, Disp: 90 tablet, Rfl: 3  cholecalciferol (VITAMIN D3) 1000 UNIT tablet, Take 1 tablet by mouth daily, Disp: 90 tablet, Rfl: 3    No current facility-administered medications on file prior to visit.       Social and family history reviewed either in EPIC or with patient and are not contributory unless specifically noted in HPI.          Physical Exam  Vitals   There were  no vitals filed for this visit.       EXAM  General: Well-developed, well-nourished female oriented to person, time, and place.     Voice Quality & Communication:  Speech clear and intelligible, normal voice without hoarseness, no breathiness or stridor    Head and Face:  NCAT, Face symmetric, normal & symmetric facial strength, no significant skin lesions or scars      Ears:  Grossly normal hearing    Nose:  Dorsum essentially midline with no skin lesions. Anterior rhinoscopy: Septum midline, turbinates wnl, minimal mucosal edema    Oral Cavity:  Lips, tongue, floor of mouth, gingival and buccal mucosa without lesions or masses, tongue midline and mobile. No apparent dental caries.    Oropharynx:  Palate mobile, uvula midline, no tonsillar hypertrophy or exudates, posterior pharynx clear    Preprocedure diagnosis: facial pain. Headaches, opacification of left max sinus on CTA head  Unable to see with mirror exam.    NASAL ENDOSCOPY      Verbal consent was obtained. The nose was decongested and anesthetized with a mixture of Neo-Synephrine and 4% xylocaine.  A flexible fiberoptic  scope was passed.      Findings:   Nose: Uncinate process/turbinates-no edema or erythema.  No purulence. Left max ostium is visible with glistening polyp vs mucous retention cyst in sinus cavity-grayish-appears more cystic than solid  Nasopharynx: No masses or exudates. Eustachian tube orifices patent.      Postprocedure diagnosis: left maxillary sinus mucous retention cyst, facial pain/headaches          A/P: 50 yo with left maxillary sinus near complete opacification-appears to be mucous retention cyst (although cannot r/o polyp). She has left sided cheek/facial pressure and left sided headaches twice a week. Also with hx migraine. CTA head at [facility] was reviewed showing near complete left max opac and small polyp vs MRC in right max sinus. She has no right sided symptoms. Will check CT sinuses and televisit f/u to  ensure only left max sinus needs to be addressed. Given she is symptomatic I recommend left maxillary antrostomy with tissue removal endoscopically-she signed consent after r/b/a discussed.         Update 11/02/20: no change in health

## 2020-11-02 NOTE — Addendum Note (Signed)
Addendum  created 11/02/20 1541 by Bertrum Sol, MD    Flowsheet accepted

## 2020-11-02 NOTE — Discharge Instructions (Signed)
Sinus surgery, Care After    STARTING TOMORROW, RINSE YOUR NOSE GENTLY WITH SALINE 3 TIMES A DAY UNTIL FOLLOW UP. YOU CAN BUY A NETI POT OR NEIL MED SINUS RINSE KIT AT THE PHARMACY WHICH CONTAINS SALT PACKETS THAT YOU MIX WITH DISTILLED WATER. YOU CAN ALSO BOIL WATER AND BRING IT TO ROOM TEMPERATURE. DO NOT USE TAP WATER THAT HAS NOT BEEN TREATED.   NO HEAVY LIFTING, BENDING, STRAINING x 1 week.  TYLENOL FOR MILD PAIN. MAY USE PRESCRIPTION PAIN MED FOR BREAKTHROUGH.     This sheet gives you information about how to care for yourself after your procedure. Your health care provider may also give you more specific instructions. If you have problems or questions, contact your health care provider.  What can I expect after the procedure?  After the procedure, it is common to have:   A mild headache.   A stuffy nose.   A feeling of fullness in your ears.   Bloody fluid coming from your nose.  Follow these instructions at home:  Medicines       1. Take over-the-counter and prescription medicines only as told by your health care provider.  2. If you were prescribed an antibiotic medicine, use it as told by your health care provider. Do not stop using the antibiotic even if you start to feel better.  3. If you are taking prescription pain medicine, take actions to prevent or treat constipation. Your health care provider may recommend that you:  ? Drink enough fluid to keep your urine pale yellow.  ? Eat foods that are high in fiber, such as fresh fruits and vegetables, whole grains, and beans.  ? Limit foods that are high in fat and processed sugars, such as fried or sweet foods.  ? Take an over-the-counter or prescription medicine for constipation.  What to avoid        Avoid eating hot and spicy foods for several days after surgery or as told by your health care provider.   Do not blow your nose for 2 weeks after surgery or as told by your health care provider.   Avoid strenuous activities for 2 weeks. These include  activities such as running or playing sports. These activities can cause nosebleeds.   Avoid straining when having a bowel movement. Straining can cause a nosebleed.   Avoid very hot or steamy showers for several days after surgery or as told by your health care provider.   Do not lift anything that is heavier than 10 lb (4.5 kg), or the limit that you are told, until your health care provider says that it is safe.  General instructions        You may be asked to clean your nostrils with an over-the-counter saline nasal spray. This will help to clear the crusts and blood clots in your nose. Use it as told by your health care provider.   Raise (elevate) your head while you are lying down.   If you have nasal splints, follow your health care provider's instructions about removal.   If your nose was packed with gauze, follow your health care provider's instructions about removal.   Keep all follow-up visits as told by your health care provider. This is important.  Contact a health care provider if you:   Develop swelling or increased pain in your nose.   Have yellowish-white fluid (pus) coming from your nose.   Have a fever.   Have severe diarrhea.   Have  nausea that does not go away.   Cannot breathe through your nose.  Get help right away if you:   Are short of breath.   Feel dizzy or you faint.   Have vision changes.   Are bleeding heavily from your nose.   Are vomiting.   Have a severe headache or a stiff neck.  Summary   After the procedure, it is common to have a mild headache, stuffy nose, feeling of fullness in your ears, and bloody fluid coming from your nose.   Follow instructions from your health care provider about food and fluids that you should avoid.   Do not blow your nose for 2 weeks after the surgery.   Do not lift anything that is heavier than 10 lb (4.5 kg), or the limit that you are told, until your health care provider says that it is safe.  This information is not intended  to replace advice given to you by your health care provider. Make sure you discuss any questions you have with your health care provider.

## 2020-11-02 NOTE — Addendum Note (Signed)
Addendum  created 11/02/20 1421 by Bertrum Sol, MD    Order list changed, Pharmacy for encounter modified

## 2020-11-03 ENCOUNTER — Other Ambulatory Visit (HOSPITAL_BASED_OUTPATIENT_CLINIC_OR_DEPARTMENT_OTHER): Payer: Self-pay | Admitting: Student in an Organized Health Care Education/Training Program

## 2020-11-03 DIAGNOSIS — I10 Essential (primary) hypertension: Secondary | ICD-10-CM

## 2020-11-03 DIAGNOSIS — K580 Irritable bowel syndrome with diarrhea: Secondary | ICD-10-CM

## 2020-11-03 MED ORDER — LISINOPRIL 20 MG PO TABS
20.0000 mg | ORAL_TABLET | Freq: Every day | ORAL | 0 refills | Status: DC
Start: 2020-11-03 — End: 2020-12-07

## 2020-11-03 MED ORDER — DOXEPIN HCL 10 MG PO CAPS
10.0000 mg | ORAL_CAPSULE | Freq: Every day | ORAL | 0 refills | Status: DC
Start: 2020-11-03 — End: 2021-02-06

## 2020-11-03 NOTE — Surgery Post-Op (Signed)
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

## 2020-11-03 NOTE — Telephone Encounter (Signed)
Needs HTN F/U.  Message sent to [place].  Limited 30 day refill.  Prefer patient be seen prior to next refill.    Robbie Lis, MD MPP (PGY3)  Pager Number: [phone]  11/03/2020 4:08 PM

## 2020-11-03 NOTE — Telephone Encounter (Signed)
PER Pharmacy, Priscilla Williams is a 50 year old female has requested a refill of      -  Doxepin 10 mg       Last Office Visit: 07/28/20 with Rondel Jumbo  Last Physical Exam: 09/12/15     PAP SMEAR due on 01/22/2018  HPV SCREENING due on 01/22/2018     Other Med Adult:  Most Recent BP Reading(s)  11/02/20 : (!) 173/106        Cholesterol (mg/dL)   Date Value   03/26/2019 207     LOW DENSITY LIPOPROTEIN DIRECT (mg/dL)   Date Value   03/26/2019 110     HIGH DENSITY LIPOPROTEIN (mg/dL)   Date Value   03/26/2019 75     TRIGLYCERIDES (mg/dL)   Date Value   03/26/2019 83         THYROID SCREEN TSH REFLEX FT4 (uIU/mL)   Date Value   03/26/2019 0.786         TSH (THYROID STIM HORMONE) (uIU/mL)   Date Value   07/31/2015 1.290       HEMOGLOBIN A1C (%)   Date Value   03/26/2019 5.0       No results found for: POCA1C      No results found for: INR    SODIUM (mmol/L)   Date Value   10/06/2019 138       POTASSIUM (mmol/L)   Date Value   10/06/2019 4.7           CREATININE (mg/dL)   Date Value   10/06/2019 0.6        Documented patient preferred pharmacies:    [facility] [place] [place]  Phone: [phone] Fax: [phone]

## 2020-11-03 NOTE — Telephone Encounter (Signed)
PER Patient (self), Priscilla Williams is a 50 year old female has requested a refill of      -  Lisinopril 20 mg       Last Office Visit: 07/28/20 with Rondel Jumbo  Last Physical Exam: 09/12/15     PAP SMEAR due on 01/22/2018  HPV SCREENING due on 01/22/2018     Other Med Adult:  Most Recent BP Reading(s)  11/02/20 : (!) 173/106        Cholesterol (mg/dL)   Date Value   03/26/2019 207     LOW DENSITY LIPOPROTEIN DIRECT (mg/dL)   Date Value   03/26/2019 110     HIGH DENSITY LIPOPROTEIN (mg/dL)   Date Value   03/26/2019 75     TRIGLYCERIDES (mg/dL)   Date Value   03/26/2019 83         THYROID SCREEN TSH REFLEX FT4 (uIU/mL)   Date Value   03/26/2019 0.786         TSH (THYROID STIM HORMONE) (uIU/mL)   Date Value   07/31/2015 1.290       HEMOGLOBIN A1C (%)   Date Value   03/26/2019 5.0       No results found for: POCA1C      No results found for: INR    SODIUM (mmol/L)   Date Value   10/06/2019 138       POTASSIUM (mmol/L)   Date Value   10/06/2019 4.7           CREATININE (mg/dL)   Date Value   10/06/2019 0.6        Documented patient preferred pharmacies:    [facility] [place], [place]  Phone: [phone] Fax: [phone]

## 2020-11-06 ENCOUNTER — Ambulatory Visit (HOSPITAL_BASED_OUTPATIENT_CLINIC_OR_DEPARTMENT_OTHER): Payer: BC Managed Care – HMO

## 2020-11-08 ENCOUNTER — Ambulatory Visit: Payer: BC Managed Care – HMO | Attending: Otolaryngology | Admitting: Otolaryngology

## 2020-11-08 ENCOUNTER — Other Ambulatory Visit: Payer: Self-pay

## 2020-11-08 DIAGNOSIS — J341 Cyst and mucocele of nose and nasal sinus: Secondary | ICD-10-CM | POA: Diagnosis present

## 2020-11-08 LAB — SURGICAL PATH SPECIMEN ENT

## 2020-11-08 NOTE — Progress Notes (Signed)
Postop s/p left max antrostomy with polyp/mucous retention cyst removal  Path benign  No issues, can breathe better now    Preprocedure diagnosis: left maxillary polyp/mucous retention cyst  Unable to see with mirror exam.    NASAL ENDOSCOPY      Verbal consent was obtained. The nose was decongested and anesthetized with a mixture of Neo-Synephrine and 4% xylocaine.  A flexible fiberoptic scope was passed left side    Findings:   Nose: Mild edema. Left max ostium widely patent.  No polyps or masses. No purulence.  Nasopharynx: No masses or exudates. Eustachian tube orifices patent.    Postprocedure diagnosis:left max sinus mucous retentioncyst/nasal polyps s/p removal. Max ostium widely patent      A/P s/p left max antrostomy doing well  Path benign  Irrigate BID x 2 more weeks  May blow nose and resume normal activity  F/u Priscilla Williams after healing in 8 weeks if any issues or sooner prn

## 2020-11-08 NOTE — Addendum Note (Signed)
Addended by: Rondel Jumbo on: 11/08/2020 11:15 AM     Modules accepted: Orders

## 2020-12-07 ENCOUNTER — Encounter (HOSPITAL_BASED_OUTPATIENT_CLINIC_OR_DEPARTMENT_OTHER): Payer: Self-pay | Admitting: Student in an Organized Health Care Education/Training Program

## 2020-12-07 DIAGNOSIS — I10 Essential (primary) hypertension: Secondary | ICD-10-CM

## 2020-12-08 ENCOUNTER — Encounter (HOSPITAL_BASED_OUTPATIENT_CLINIC_OR_DEPARTMENT_OTHER): Payer: Self-pay | Admitting: Student in an Organized Health Care Education/Training Program

## 2020-12-08 MED ORDER — LISINOPRIL 20 MG PO TABS
20.0000 mg | ORAL_TABLET | Freq: Every day | ORAL | 0 refills | Status: DC
Start: 2020-12-08 — End: 2021-02-06

## 2020-12-08 NOTE — Telephone Encounter (Signed)
PER Patient (self), Priscilla Williams is a 50 year old female has requested a refill of Lisinopril.    This refill request failed protocol because      Normal serum potassium in past 12 months    Last clinic BP under 140/90. If clinic/home BP is not under control, pharmacy to make PC appt for patient     eGFR > 55 in past year    Medication not refilled in past 45 days (1.5 months)       Last Office Visit: 05/01/2020 with Ribourg, S  Last Physical Exam: 09/12/2015    PAP SMEAR due on 01/22/2018  HPV SCREENING due on 01/22/2018    HTN Med:    Most Recent BP Reading(s)  11/02/20 : (!) 173/106  05/01/20 : 132/80  04/09/19 : 136/88      Documented patient preferred pharmacies:    [place], [place]  Phone: [phone] Fax: [phone]

## 2020-12-08 NOTE — Telephone Encounter (Signed)
MyChart message sent to pt to request HTN F/U appt.  Limited refill for 60 days.    Robbie Lis, MD MPP (PGY3)  Pager Number: [phone]  12/08/2020 4:07 PM

## 2020-12-12 ENCOUNTER — Ambulatory Visit (HOSPITAL_BASED_OUTPATIENT_CLINIC_OR_DEPARTMENT_OTHER): Payer: Self-pay | Admitting: Student in an Organized Health Care Education/Training Program

## 2020-12-13 DIAGNOSIS — M4712 Other spondylosis with myelopathy, cervical region: Secondary | ICD-10-CM | POA: Diagnosis not present

## 2020-12-23 ENCOUNTER — Other Ambulatory Visit (HOSPITAL_BASED_OUTPATIENT_CLINIC_OR_DEPARTMENT_OTHER): Payer: Self-pay | Admitting: Student in an Organized Health Care Education/Training Program

## 2020-12-23 DIAGNOSIS — E559 Vitamin D deficiency, unspecified: Secondary | ICD-10-CM

## 2020-12-23 NOTE — Telephone Encounter (Signed)
PER Pharmacy, Priscilla Williams is a 50 year old female has requested a refill of      -  vitamin       Last Office Visit: 05/01/20 with ribourg s   Last Physical Exam: 09/12/15     PAP SMEAR due on 01/22/2018  HPV SCREENING due on 01/22/2018     Other Med Adult:  Most Recent BP Reading(s)  11/02/20 : (!) 173/106        Cholesterol (mg/dL)   Date Value   03/26/2019 207     LOW DENSITY LIPOPROTEIN DIRECT (mg/dL)   Date Value   03/26/2019 110     HIGH DENSITY LIPOPROTEIN (mg/dL)   Date Value   03/26/2019 75     TRIGLYCERIDES (mg/dL)   Date Value   03/26/2019 83         THYROID SCREEN TSH REFLEX FT4 (uIU/mL)   Date Value   03/26/2019 0.786         TSH (THYROID STIM HORMONE) (uIU/mL)   Date Value   07/31/2015 1.290       HEMOGLOBIN A1C (%)   Date Value   03/26/2019 5.0       No results found for: POCA1C      No results found for: INR    SODIUM (mmol/L)   Date Value   10/06/2019 138       POTASSIUM (mmol/L)   Date Value   10/06/2019 4.7           CREATININE (mg/dL)   Date Value   10/06/2019 0.6        Documented patient preferred pharmacies:    [facility] - [place], [place]  Phone: [phone] Fax: [phone]    [id number] [place] [address] Phone: [phone]: [phone]

## 2020-12-26 ENCOUNTER — Encounter (HOSPITAL_BASED_OUTPATIENT_CLINIC_OR_DEPARTMENT_OTHER): Payer: Self-pay | Admitting: Student in an Organized Health Care Education/Training Program

## 2020-12-26 DIAGNOSIS — E559 Vitamin D deficiency, unspecified: Secondary | ICD-10-CM

## 2020-12-26 NOTE — Telephone Encounter (Signed)
PER Patient (self), Priscilla Williams is a 50 year old female has requested a refill of      -  vitamin       Last Office Visit: 05/01/20 with ribourg s   Last Physical Exam: 09/12/15     PAP SMEAR due on 01/22/2018  HPV SCREENING due on 01/22/2018     Other Med Adult:  Most Recent BP Reading(s)  11/02/20 : (!) 173/106        Cholesterol (mg/dL)   Date Value   03/26/2019 207     LOW DENSITY LIPOPROTEIN DIRECT (mg/dL)   Date Value   03/26/2019 110     HIGH DENSITY LIPOPROTEIN (mg/dL)   Date Value   03/26/2019 75     TRIGLYCERIDES (mg/dL)   Date Value   03/26/2019 83         THYROID SCREEN TSH REFLEX FT4 (uIU/mL)   Date Value   03/26/2019 0.786         TSH (THYROID STIM HORMONE) (uIU/mL)   Date Value   07/31/2015 1.290       HEMOGLOBIN A1C (%)   Date Value   03/26/2019 5.0       No results found for: POCA1C      No results found for: INR    SODIUM (mmol/L)   Date Value   10/06/2019 138       POTASSIUM (mmol/L)   Date Value   10/06/2019 4.7           CREATININE (mg/dL)   Date Value   10/06/2019 0.6        Documented patient preferred pharmacies:    [facility] - [place], [place]  Phone: [phone] Fax: [phone]    [id number] [place] Phone: [phone]: [phone]

## 2021-01-16 ENCOUNTER — Ambulatory Visit (HOSPITAL_BASED_OUTPATIENT_CLINIC_OR_DEPARTMENT_OTHER): Payer: BC Managed Care – HMO | Admitting: Family Medicine

## 2021-01-31 DIAGNOSIS — N925 Other specified irregular menstruation: Secondary | ICD-10-CM | POA: Diagnosis not present

## 2021-02-06 ENCOUNTER — Encounter (HOSPITAL_BASED_OUTPATIENT_CLINIC_OR_DEPARTMENT_OTHER): Payer: Self-pay | Admitting: Student in an Organized Health Care Education/Training Program

## 2021-02-06 DIAGNOSIS — K580 Irritable bowel syndrome with diarrhea: Secondary | ICD-10-CM

## 2021-02-06 DIAGNOSIS — I10 Essential (primary) hypertension: Secondary | ICD-10-CM

## 2021-02-07 MED ORDER — DOXEPIN HCL 10 MG PO CAPS
10.0000 mg | ORAL_CAPSULE | Freq: Every day | ORAL | 3 refills | Status: DC
Start: 2021-02-07 — End: 2021-02-19

## 2021-02-07 MED ORDER — LISINOPRIL 20 MG PO TABS
20.0000 mg | ORAL_TABLET | Freq: Every day | ORAL | 1 refills | Status: DC
Start: 2021-02-07 — End: 2021-02-19

## 2021-02-07 NOTE — Telephone Encounter (Signed)
PER Patient (self), Priscilla Williams is a 50 year old female has requested a refill of lisinopril and doxepin.      Last Office Visit: 05/01/20 with Ribourg, S  Last Physical Exam: 09/12/15    PAP SMEAR due on 01/22/2018  HPV SCREENING due on 01/22/2018    Other Med Adult:  Most Recent BP Reading(s)  11/02/20 : (!) 173/106        Cholesterol (mg/dL)   Date Value   03/26/2019 207     LOW DENSITY LIPOPROTEIN DIRECT (mg/dL)   Date Value   03/26/2019 110     HIGH DENSITY LIPOPROTEIN (mg/dL)   Date Value   03/26/2019 75     TRIGLYCERIDES (mg/dL)   Date Value   03/26/2019 83         THYROID SCREEN TSH REFLEX FT4 (uIU/mL)   Date Value   03/26/2019 0.786         TSH (THYROID STIM HORMONE) (uIU/mL)   Date Value   07/31/2015 1.290       HEMOGLOBIN A1C (%)   Date Value   03/26/2019 5.0       No results found for: POCA1C      No results found for: INR    SODIUM (mmol/L)   Date Value   10/06/2019 138       POTASSIUM (mmol/L)   Date Value   10/06/2019 4.7           CREATININE (mg/dL)   Date Value   10/06/2019 0.6       Documented patient preferred pharmacies:    [place], [place]  Phone: [phone] Fax: [phone]    [id number] [place] [address] Phone: [phone]: [phone]

## 2021-02-08 DIAGNOSIS — D2371 Other benign neoplasm of skin of right lower limb, including hip: Secondary | ICD-10-CM | POA: Diagnosis not present

## 2021-02-08 DIAGNOSIS — D225 Melanocytic nevi of trunk: Secondary | ICD-10-CM | POA: Diagnosis not present

## 2021-02-08 DIAGNOSIS — D2239 Melanocytic nevi of other parts of face: Secondary | ICD-10-CM | POA: Diagnosis not present

## 2021-02-08 DIAGNOSIS — L814 Other melanin hyperpigmentation: Secondary | ICD-10-CM | POA: Diagnosis not present

## 2021-02-19 ENCOUNTER — Ambulatory Visit: Payer: BC Managed Care – HMO | Attending: Internal Medicine | Admitting: Family Medicine

## 2021-02-19 ENCOUNTER — Other Ambulatory Visit: Payer: Self-pay

## 2021-02-19 VITALS — BP 135/83 | HR 85 | Temp 96.6°F | Wt 222.6 lb

## 2021-02-19 DIAGNOSIS — K580 Irritable bowel syndrome with diarrhea: Secondary | ICD-10-CM | POA: Insufficient documentation

## 2021-02-19 DIAGNOSIS — K623 Rectal prolapse: Secondary | ICD-10-CM | POA: Insufficient documentation

## 2021-02-19 DIAGNOSIS — E559 Vitamin D deficiency, unspecified: Secondary | ICD-10-CM | POA: Diagnosis present

## 2021-02-19 DIAGNOSIS — I10 Essential (primary) hypertension: Secondary | ICD-10-CM | POA: Diagnosis present

## 2021-02-19 DIAGNOSIS — R55 Syncope and collapse: Secondary | ICD-10-CM | POA: Diagnosis present

## 2021-02-19 DIAGNOSIS — I671 Cerebral aneurysm, nonruptured: Secondary | ICD-10-CM | POA: Insufficient documentation

## 2021-02-19 DIAGNOSIS — Z8249 Family history of ischemic heart disease and other diseases of the circulatory system: Secondary | ICD-10-CM | POA: Diagnosis present

## 2021-02-19 LAB — BASIC METABOLIC PANEL
ANION GAP: 10 mmol/L (ref 10–22)
BUN (UREA NITROGEN): 13 mg/dL (ref 7–18)
CALCIUM: 9.4 mg/dl (ref 8.5–10.1)
CARBON DIOXIDE: 28 mmol/L (ref 21–32)
CHLORIDE: 100 mmol/L (ref 98–107)
CREATININE: 0.7 mg/dL (ref 0.4–1.2)
ESTIMATED GLOMERULAR FILT RATE: >60 mL/min (ref >60)
Glucose Random: 93 mg/dL (ref 74–160)
POTASSIUM: 4.3 mmol/L (ref 3.5–5.1)
SODIUM: 138 mmol/L (ref 136–145)

## 2021-02-19 LAB — LIPID PANEL
Cholesterol: 235 mg/dL (ref 0–239)
HIGH DENSITY LIPOPROTEIN: 89 mg/dL — ABNORMAL HIGH (ref 40–60)
LOW DENSITY LIPOPROTEIN DIRECT: 138 mg/dL (ref 0–189)
TRIGLYCERIDES: 94 mg/dL (ref 0–150)

## 2021-02-19 LAB — MICROALBUMIN RANDOM URINE
ALB/CREAT RATIO URINE RAN: 25 ug/mg (ref 0–30)
ALBUMIN URINE RANDOM: 2.2 mg/dL — ABNORMAL HIGH (ref 0.0–1.9)
CREATININE RANDOM URINE: 88 mg/dL (ref 28–217)

## 2021-02-19 LAB — VITAMIN D,25 HYDROXY: VITAMIN D,25 HYDROXY: 49 ng/mL (ref 30.0–100.0)

## 2021-02-19 MED ORDER — DOXEPIN HCL 10 MG PO CAPS
10.0000 mg | ORAL_CAPSULE | Freq: Every day | ORAL | 3 refills | Status: DC
Start: 2021-02-19 — End: 2022-02-16

## 2021-02-19 MED ORDER — LISINOPRIL 20 MG PO TABS
20.0000 mg | ORAL_TABLET | Freq: Every day | ORAL | 1 refills | Status: DC
Start: 2021-02-19 — End: 2021-04-12

## 2021-02-19 NOTE — Progress Notes (Signed)
[facility]  Office Visit Note     Subjective:   Priscilla Williams is a 50 year old female patient who presents today for: Patient presents with:  Follow Up: Blood pressure.      HTN  Has been having some episodes in the past few weeks of near syncope where she gets a weird feeling like she's gonna pass out and needs to rest. Feels some palpitations sometimes, not sure if when having symptoms.   Labile BPs with migraines, has previous had monocular vision loss with this.   Has a brain aneurysm found on CT in 2018, follows with Dr. Rochel Brome at [facility] for Neurology. Last CT stable in Sept 2021. Has not had recent in person visit. Will call to update about new symptoms  When has checked BP during these episode has been 150s/110s.   EKG in clinic today: Unchanged from prior. Normal sinus rhythm     Family Cardiac History  Significant family history of MI's Brother died at age 11, Paternal Grandmother died from MI at 57, Father died of MI at 46 but with multiple prior with CVAs(first at 38)    Needs referral to [facility] for rectal prolapse  Seen by OBGYN in clinic, requires specialist referral for surgery. Gave name of specialist.    Hx of Vitamin D deficiency  Would like to check Vitamin D level as she is still taking the supplement and would like to know if she should stop it.      No interpreter was required during this encounter.     ROS:  Review of systems conducted and pertinent review of systems as noted in HPI.    Patient Active Problem List:     Migraine     Irritable bowel syndrome     Morbid obesity with BMI of 40.0-44.9, adult (HCC)     Nasal polyps     Osteoarthritis of cervical spine with myelopathy     Hepatic steatosis     Asymptomatic microscopic hematuria     Brain aneurysm     Essential hypertension     Maxillary sinus cyst     Lung nodule     RUQ abdominal pain      MEDS:    Current Outpatient Medications:     doxepin (SINEQUAN) 10 MG capsule, Take 1 capsule by mouth in the morning., Disp: 90  capsule, Rfl: 3    lisinopril (ZESTRIL) 20 MG tablet, Take 1 tablet by mouth in the morning., Disp: 30 tablet, Rfl: 1    D3-1000 25 MCG (1000 UT) tablet, TAKE 1 TABLET BY MOUTH EVERY DAY, Disp: 90 tablet, Rfl: 0    Review of Patient's Allergies indicates:  No Known Allergies    Objective:   BP 135/83    Pulse 85    Temp 96.6 F (35.9 C) (Temporal)    Wt 101 kg (222 lb 9.6 oz)    SpO2 98%    BMI 42.06 kg/m   General: No acute distress, well appearing.  HEENT:  PERRL, EOMI, sclera non-injected.  Cardio: Regular rate and rhythm, S1, S2 normal. No murmur, rubs or gallops. No carotid bruits.  Pulm: Clear to auscultation bilaterally, no wheezing, rhonchi or crackles.  Ext: Warm, well-perfused. No lower extremity edema bilaterally.  Neuro: AOx3. CN grossly intact. Gait normal.     Assessment & Plan:    Priscilla Williams is a 49 year old female who presented to clinic today for Patient presents with:  Follow Up:  Blood pressure.     1. Essential hypertension  Given patient's reported family history of severe cardiac disease on her father side and report of intermittent palpitations will refer to cardiology.  We will get lipid panel, BMP, microalbumin today.  EKG normal sinus by my read.  Patient to continue on lisinopril 20 mg and continue home blood pressure monitoring.  Gave handout regarding home blood pressure monitoring.  - LIPID PANEL  - BASIC METABOLIC PANEL  - MICROALBUMIN RANDOM URINE  - REFERRAL TO CARDIOLOGY (INT)  - EKG    2. Vitamin D deficiency  Screening vitamin D to determine if normal level and patient may stop supplement.  - VITAMIN D,25 HYDROXY    3. Rectal prolapse  Patient with rectal prolapse seen by external OB/GYN and would like referral to urogynecologist and rectal specialist.  Referral placed.  - REFERRAL TO GYNECOLOGY (EXT)    4. Brain aneurysm  Last imaging showed stable aneurysm on CT in 2021.  Patient to reach out to neurologist at [facility] given new symptom for work-up.    5.  Pre-syncope  Patient experiencing presyncope with concern for multiple etiologies.  Given cardiac history and family patient reported palpitations could be cardiac in origin.  Referred to cardiology for work-up to include carotid dopplers.  EKG normal today but patient not experiencing symptoms or palpitations at this time.  Concern for CNS etiology given brain aneurysm. Patient to follow-up with neurologist given new symptom.  Patient on lisinopril and checking BMP today for potential metabolic cause.  Patient reports blood pressure typically high during these episodes but orthostatic hypotension could be related.  Discussed strict return precautions if symptoms worsening or fail to improve.  Discussed in the event of full syncope, patient to report to emergency room.  - EKG      Follow-up:   [ ]  1 month or as needed if symptoms fail to improve or worsen.    I have reviewed the past medical, family, and social history sections including the medications and allergies listed in the above medical record. I reconciled the patient's medication list and prepared and supplied needed refills. The patient indicates understanding of the above issues and agrees with the plan. The patient has been given an After Visit Summary sheet that lists all of their medications with directions, their allergies, orders placed during this encounter, immunization dates, and follow- up instructions.      Discussed with Dr. Rogers Blocker, MD.    Jodi Geralds Lang Snow, MD, 02/19/2021

## 2021-02-19 NOTE — Progress Notes (Signed)
PRECEPTOR NOTE:    I personally reviewed this case, along with the patients history and I personally interviewed and examined this patient, along with TESS Lang Snow, MD.     I agree with the assessment and plan, as documented in the visit note.    In brief:  50 year old F with relevant PMHx HTN and notable for CVD.  Past few weeks with new near-syncopal episodes. Has to sit. At least 5 occurrences. Some sitting, some while active.  Home BPs during these 150s/110s.  Labile BPs with migraines, has previous had monocular vision loss with this. Known brain aneurysm followed by neuro.  FHx: brother with MI at 89y/o, dad with 1st stroke at 22y/o and fatal MI in 56s, paternal aunt with MI in 64s.  Intermittent palpitations.  Needs updated lipids, BMP.    DDx of presyncopal episodes includes vascular (aortic stenosis e.g.), hypertensive lability, new onset sz (less likely) due to pressure from brain aneurysm. EKG unremarkable today. Needs carotid dopplers, neuro follow up.    Please see resident note for more details.    Esmond Harps, MD, 02/19/2021

## 2021-02-20 NOTE — Progress Notes (Signed)
Lipid panel with mildly elevated HDL. BMP normal. Vitamin D normal. May stop Vit D supplement. Microalbumin with slightly elevated albumin without meeting criteria for microalbuminuria.Patient has been notified of results.    Priscilla Bhat Lang Snow, MD, 02/20/2021

## 2021-02-21 DIAGNOSIS — Z8249 Family history of ischemic heart disease and other diseases of the circulatory system: Secondary | ICD-10-CM | POA: Insufficient documentation

## 2021-02-21 LAB — EKG

## 2021-02-28 ENCOUNTER — Encounter (HOSPITAL_BASED_OUTPATIENT_CLINIC_OR_DEPARTMENT_OTHER): Payer: Self-pay | Admitting: Family Medicine

## 2021-02-28 NOTE — Addendum Note (Signed)
Addended by: Danella Penton on: 02/28/2021 04:40 PM     Modules accepted: Orders

## 2021-03-20 ENCOUNTER — Other Ambulatory Visit (HOSPITAL_BASED_OUTPATIENT_CLINIC_OR_DEPARTMENT_OTHER): Payer: Self-pay | Admitting: Family Medicine

## 2021-03-20 DIAGNOSIS — E559 Vitamin D deficiency, unspecified: Secondary | ICD-10-CM

## 2021-03-20 NOTE — Telephone Encounter (Signed)
PER Pharmacy, Priscilla Williams is a 50 year old female has requested a refill of Vitamin D .      VITAMIN D,25 HYDROXY (ng/mL)   Date Value   02/19/2021 49   03/26/2019 19 (*L)   07/23/2016 6 (*L)         Last Office Visit: 02/19/2021 with PCP  Last Physical Exam: 09/12/2015    PAP SMEAR due on 01/22/2018  HPV SCREENING due on 01/22/2018    Other Med Adult:  Most Recent BP Reading(s)  02/19/21 : 135/83        Cholesterol (mg/dL)   Date Value   02/19/2021 235     LOW DENSITY LIPOPROTEIN DIRECT (mg/dL)   Date Value   02/19/2021 138     HIGH DENSITY LIPOPROTEIN (mg/dL)   Date Value   02/19/2021 89 (H)     TRIGLYCERIDES (mg/dL)   Date Value   02/19/2021 94         THYROID SCREEN TSH REFLEX FT4 (uIU/mL)   Date Value   03/26/2019 0.786         TSH (THYROID STIM HORMONE) (uIU/mL)   Date Value   07/31/2015 1.290       HEMOGLOBIN A1C (%)   Date Value   03/26/2019 5.0       No results found for: POCA1C      No results found for: INR    SODIUM (mmol/L)   Date Value   02/19/2021 138       POTASSIUM (mmol/L)   Date Value   02/19/2021 4.3           CREATININE (mg/dL)   Date Value   02/19/2021 0.7       Documented patient preferred pharmacies:        [id number] [place] [address] Phone: [phone]: [phone]

## 2021-03-24 ENCOUNTER — Encounter (HOSPITAL_BASED_OUTPATIENT_CLINIC_OR_DEPARTMENT_OTHER): Payer: Self-pay | Admitting: Otolaryngology

## 2021-03-30 DIAGNOSIS — R202 Paresthesia of skin: Secondary | ICD-10-CM | POA: Diagnosis not present

## 2021-03-30 DIAGNOSIS — R42 Dizziness and giddiness: Secondary | ICD-10-CM | POA: Diagnosis not present

## 2021-04-11 ENCOUNTER — Other Ambulatory Visit: Payer: Self-pay

## 2021-04-11 ENCOUNTER — Ambulatory Visit
Admission: RE | Admit: 2021-04-11 | Discharge: 2021-04-11 | Disposition: A | Discharge status: Home / Self Care | Payer: BC Managed Care – HMO | Attending: Family Medicine | Admitting: Family Medicine

## 2021-04-11 DIAGNOSIS — I6522 Occlusion and stenosis of left carotid artery: Secondary | ICD-10-CM | POA: Diagnosis present

## 2021-04-11 DIAGNOSIS — R55 Syncope and collapse: Secondary | ICD-10-CM

## 2021-04-12 ENCOUNTER — Encounter (HOSPITAL_BASED_OUTPATIENT_CLINIC_OR_DEPARTMENT_OTHER): Payer: Self-pay | Admitting: Family Medicine

## 2021-04-12 DIAGNOSIS — I10 Essential (primary) hypertension: Secondary | ICD-10-CM

## 2021-04-13 MED ORDER — LISINOPRIL 20 MG PO TABS
20.0000 mg | ORAL_TABLET | Freq: Every day | ORAL | 3 refills | Status: DC
Start: 2021-04-13 — End: 2022-04-05

## 2021-04-13 NOTE — Telephone Encounter (Signed)
This prescription refill request has passed the Rx Renewal Authorization protocol.  This medication has been approved and sent to the patient's preferred pharmacy.      PER Patient (self), Priscilla Williams is a 50 year old female has requested a refill of lisinopril.      Last Office Visit: 02/19/21 with t spargo  Last Physical Exam: 09/12/2015    PAP SMEAR due on 01/22/2018  HPV SCREENING due on 01/22/2018    Other Med Adult:  Most Recent BP Reading(s)  02/19/21 : 135/83        Cholesterol (mg/dL)   Date Value   02/19/2021 235     LOW DENSITY LIPOPROTEIN DIRECT (mg/dL)   Date Value   02/19/2021 138     HIGH DENSITY LIPOPROTEIN (mg/dL)   Date Value   02/19/2021 89 (H)     TRIGLYCERIDES (mg/dL)   Date Value   02/19/2021 94         THYROID SCREEN TSH REFLEX FT4 (uIU/mL)   Date Value   03/26/2019 0.786         TSH (THYROID STIM HORMONE) (uIU/mL)   Date Value   07/31/2015 1.290       HEMOGLOBIN A1C (%)   Date Value   03/26/2019 5.0       No results found for: POCA1C      No results found for: INR    SODIUM (mmol/L)   Date Value   02/19/2021 138       POTASSIUM (mmol/L)   Date Value   02/19/2021 4.3           CREATININE (mg/dL)   Date Value   02/19/2021 0.7       Documented patient preferred pharmacies:    [facility] - [place], [place]  Phone: [phone] Fax: [phone]

## 2021-04-17 DIAGNOSIS — N912 Amenorrhea, unspecified: Secondary | ICD-10-CM | POA: Diagnosis not present

## 2021-04-18 DIAGNOSIS — N912 Amenorrhea, unspecified: Secondary | ICD-10-CM | POA: Diagnosis not present

## 2021-05-01 DIAGNOSIS — J449 Chronic obstructive pulmonary disease, unspecified: Secondary | ICD-10-CM | POA: Diagnosis not present

## 2021-05-01 DIAGNOSIS — M50222 Other cervical disc displacement at C5-C6 level: Secondary | ICD-10-CM | POA: Diagnosis not present

## 2021-05-01 DIAGNOSIS — I2510 Atherosclerotic heart disease of native coronary artery without angina pectoris: Secondary | ICD-10-CM | POA: Diagnosis not present

## 2021-05-01 DIAGNOSIS — Z981 Arthrodesis status: Secondary | ICD-10-CM | POA: Diagnosis not present

## 2021-05-01 DIAGNOSIS — M4802 Spinal stenosis, cervical region: Secondary | ICD-10-CM | POA: Diagnosis not present

## 2021-05-01 DIAGNOSIS — I6521 Occlusion and stenosis of right carotid artery: Secondary | ICD-10-CM | POA: Diagnosis not present

## 2021-05-01 DIAGNOSIS — M4712 Other spondylosis with myelopathy, cervical region: Secondary | ICD-10-CM | POA: Diagnosis not present

## 2021-05-01 DIAGNOSIS — M50223 Other cervical disc displacement at C6-C7 level: Secondary | ICD-10-CM | POA: Diagnosis not present

## 2021-05-01 DIAGNOSIS — I10 Essential (primary) hypertension: Secondary | ICD-10-CM | POA: Diagnosis not present

## 2021-05-01 DIAGNOSIS — I639 Cerebral infarction, unspecified: Secondary | ICD-10-CM | POA: Diagnosis not present

## 2021-05-22 ENCOUNTER — Ambulatory Visit (HOSPITAL_BASED_OUTPATIENT_CLINIC_OR_DEPARTMENT_OTHER): Payer: Self-pay | Admitting: Otolaryngology

## 2021-06-19 ENCOUNTER — Other Ambulatory Visit (HOSPITAL_BASED_OUTPATIENT_CLINIC_OR_DEPARTMENT_OTHER): Payer: Self-pay | Admitting: Family Medicine

## 2021-06-19 DIAGNOSIS — E559 Vitamin D deficiency, unspecified: Secondary | ICD-10-CM

## 2021-06-20 NOTE — Telephone Encounter (Signed)
Per Pharmacy, Priscilla Williams is a 50 year old female has requested a refill of VITAMIN D.      Last Office Visit: 02/19/2021 with PCP  Last Physical Exam: 09/12/2015    PAP SMEAR due on 01/22/2018  HPV SCREENING due on 01/22/2018    Other Med Adult:  Most Recent BP Reading(s)  02/19/21 : 135/83        Cholesterol (mg/dL)   Date Value   02/19/2021 235     LOW DENSITY LIPOPROTEIN DIRECT (mg/dL)   Date Value   02/19/2021 138     HIGH DENSITY LIPOPROTEIN (mg/dL)   Date Value   02/19/2021 89 (H)     TRIGLYCERIDES (mg/dL)   Date Value   02/19/2021 94         THYROID SCREEN TSH REFLEX FT4 (uIU/mL)   Date Value   03/26/2019 0.786         TSH (THYROID STIM HORMONE) (uIU/mL)   Date Value   07/31/2015 1.290       HEMOGLOBIN A1C (%)   Date Value   03/26/2019 5.0       No results found for: POCA1C      No results found for: INR    SODIUM (mmol/L)   Date Value   02/19/2021 138       POTASSIUM (mmol/L)   Date Value   02/19/2021 4.3           CREATININE (mg/dL)   Date Value   02/19/2021 0.7       Documented patient preferred pharmacies:    [facility] - [place], [place]  Phone: [phone] Fax: [phone]    [id number] [place] [address] Phone: [phone]: [phone]

## 2021-08-31 ENCOUNTER — Encounter (HOSPITAL_BASED_OUTPATIENT_CLINIC_OR_DEPARTMENT_OTHER): Payer: Self-pay | Admitting: Family Medicine

## 2021-09-04 ENCOUNTER — Telehealth (HOSPITAL_BASED_OUTPATIENT_CLINIC_OR_DEPARTMENT_OTHER): Payer: Self-pay | Admitting: Family Medicine

## 2021-09-04 DIAGNOSIS — D229 Melanocytic nevi, unspecified: Secondary | ICD-10-CM

## 2021-09-04 NOTE — Telephone Encounter (Signed)
REFERRAL REQUEST- PROVIDER, PLEASE REVIEW AND SIGN ORDER IF APPROPRIATE     HOW IS REFERRAL BEING REQUESTED: [place] message    WHO IS REFERRAL BEING REQUESTED BY: Patient    REFERRED TO SPECIALTY: Dermatology    DIAGNOSIS/CHIEF COMPLAINT: Skin Check    HAVE YOU SEEN YOUR PCP FOR THIS ISSUE: YEs    HAVE YOU SEEN YOUR PCP WITHIN THE LAST YEAR: yes      Specialty Location:  Other:                             Specialist's name: Joycelyn Das     Specialist's NPI#: [id number]               Specialty Phone Number: [phone]   Specialty Fax Number: [phone]     Reason for appointment: mole/skin check                   Date of appt: 3.14.23

## 2021-09-07 ENCOUNTER — Encounter (HOSPITAL_BASED_OUTPATIENT_CLINIC_OR_DEPARTMENT_OTHER): Payer: Self-pay | Admitting: Family Medicine

## 2021-09-12 DIAGNOSIS — L820 Inflamed seborrheic keratosis: Secondary | ICD-10-CM | POA: Diagnosis not present

## 2021-09-12 DIAGNOSIS — D485 Neoplasm of uncertain behavior of skin: Secondary | ICD-10-CM | POA: Diagnosis not present

## 2021-09-13 ENCOUNTER — Telehealth (HOSPITAL_BASED_OUTPATIENT_CLINIC_OR_DEPARTMENT_OTHER): Payer: Self-pay | Admitting: Family Medicine

## 2021-09-13 NOTE — Telephone Encounter (Signed)
LVM to schedule appt per message:     Hello, Please have this patient schedule an in person appointment to discuss weight loss medications and lab tests. Best, TESS Lang Snow, MD

## 2021-09-17 ENCOUNTER — Telehealth (HOSPITAL_BASED_OUTPATIENT_CLINIC_OR_DEPARTMENT_OTHER): Payer: Self-pay | Admitting: Family Medicine

## 2021-09-17 NOTE — Telephone Encounter (Signed)
LVM to schedule appt per following message:     Hello,   Please have this patient schedule an in person appointment to discuss weight loss medications and lab tests.     Best,   TESS Lang Snow, MD

## 2021-10-19 ENCOUNTER — Encounter (HOSPITAL_BASED_OUTPATIENT_CLINIC_OR_DEPARTMENT_OTHER): Payer: Self-pay | Admitting: Family Medicine

## 2021-10-19 ENCOUNTER — Ambulatory Visit: Payer: BC Managed Care – HMO | Attending: Family Medicine | Admitting: Family Medicine

## 2021-10-19 DIAGNOSIS — R5383 Other fatigue: Secondary | ICD-10-CM | POA: Diagnosis not present

## 2021-10-19 DIAGNOSIS — Z6837 Body mass index (BMI) 37.0-37.9, adult: Secondary | ICD-10-CM

## 2021-10-19 DIAGNOSIS — E669 Obesity, unspecified: Secondary | ICD-10-CM | POA: Diagnosis not present

## 2021-10-19 MED ORDER — SEMAGLUTIDE-WEIGHT MANAGEMENT 0.25 MG/0.5ML SC SOAJ
0.2500 mg | SUBCUTANEOUS | 0 refills | Status: DC
Start: 2021-10-19 — End: 2021-11-16

## 2021-10-19 NOTE — Prior Authorization (Signed)
Message  Received: Today  Christy Gentles, MD  P Centralized Refill Pool  Prior authorization request for [facility]   Clinical Indication: weight loss, BMI 37   Previous medications tried: None   Diagnostic testing information: Clinical exam, weights

## 2021-10-19 NOTE — Progress Notes (Signed)
[facility]  Televisit Note     Subjective:   Priscilla Williams is a 51 year old female who presents today for:    Weight  - has been doing weight watches since Oct 22  - 228 to 196 lbs (BMI 37)  - aware it is slow going and a long process, feels like she has stalled out or plateaus  - she will plateau for a month then lost a few more lbs  - has been hovering around 196lbs for 2 months  - plans to continue weight watches to eat healthy, she feels she can make these changes sustainable for the rest of her life, "this is my time"  - treats herself in moderation, can avoid binging  - interested in medications from ads she's seen on TV  - 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 No office vitals could be obtained.    General: Speaking in full sentences.    HEMOGLOBIN A1C (%)   Date Value   03/26/2019 5.0   07/23/2016 5.2   09/12/2015 5.0     THYROID SCREEN TSH REFLEX FT4 (uIU/mL)   Date Value   03/26/2019 0.786   07/23/2016 2.250   01/19/2008 1.03     HEMOGLOBIN (g/dL)   Date Value   03/26/2019 13.6   09/03/2017 13.4   07/23/2016 13.8     Assessment & Plan:    1. BMI 37.0-37.9, adult  Patient has made great progress with lifestyle changes alone so far, however is noticing that as she is losing more weight she tends to plateau for 1-2 months which is frustrating despite her adherence to dietary and exercise regimen.  - Provided an overview of medication  options to assist with weight management, patient prefers to start a weekly injectable as below  - Start semaglutide-Weight Management (WEGOVY) 0.25 MG/0.5ML sc auto-injector; Inject 0.5 mLs under the skin once a week  Dispense: 4 each; Refill: 0  - Reviewed potential side effects  - Reviewed will see if PA is required, and initiate if needed  - Reviewed if she has questions on how to administer the med she can make an RN appt, pt feels confident she and husband (who is on Trulicity) can figure it out  - Follow up in 4 weeks    2. Other fatigue  Likely related to catabolic state during weight loss journey and patient is also concerned about her remote h/o IDA and family h/o thyroid disease. Will obtain labs as below to rule these out. Patient will walk into lab next Friday.  - CBC WITH PLATELET; Future  - THYROID SCREEN TSH REFLEX FT4; Future    1. The patient indicates understanding of these issues and agrees with the plan.  2. I discussed their medications with directions, orders placed  during this encounter, and follow- up instructions.  3.  I reconciled the patient's medication list and prepared and supplied needed refills.  4.  I have reviewed the past medical, family, and social history sections including the medications and allergies listed in the above medical record    I spent a total of 35 minutes on this visit on the date of service (total time includes all activities performed on the date of service)      Jerene Bears, MD        Next time:  - wegovy dosing, wt loss

## 2021-10-23 NOTE — Prior Authorization (Signed)
This needs to be done on the form only , pharmay is faxing the form

## 2021-10-24 NOTE — Prior Authorization (Signed)
Patient Insurance: Counselling psychologist ID: [id number]  BIN: [id number]  PCN: [id number]  Group: [id number]  Prior Authorization for: Mancel Parsons 0.25MG/0.5ML          Phone:    Filled out Prior Authorization Request form for: [facility] PA form  DX & ICD 10: Z68.37     Form does not require provider signature - already faxed form to insurance company for review.  Scanned form into Epic under Environmental health practitioner - PA for: [employer]

## 2021-10-25 NOTE — Prior Authorization (Signed)
Patient Insurance: Counselling psychologist ID: [id number]  BIN: [id number]  PCN: [id number]  Group: [id number]  Prior Authorization for: Mancel Parsons 0.25MG/0.5ML          Phone:    Filled out Prior Authorization Request form for: [facility] PA form  DX & ICD 10: Z68.37     Form does not require provider signature - already faxed form to insurance company for review.  Scanned form into Epic under Environmental health practitioner - PA for: [employer]            Phone:    Filled out Prior Authorization Request form for: [employer] PA form  DX & ICD 10: Z68.37     Form does not require provider signature - already faxed form to insurance company for review.  Scanned form into Epic under Environmental health practitioner - PA for: [email or web address]     Status:    Prior Authorization Status: Approved  Type: Pharmacy       Pharmacy:    PRIOR AUTHORIZATION has been approved for the following time frame: 1 month  Start Date: 10/24/21  End Date: 11/24/21  Copay: 15     The patient and their preferred pharmacy are aware of the approval.  Approval notice has been scanned into Environmental health practitioner.

## 2021-12-10 ENCOUNTER — Telehealth (HOSPITAL_BASED_OUTPATIENT_CLINIC_OR_DEPARTMENT_OTHER): Payer: Self-pay | Admitting: Family Medicine

## 2021-12-10 ENCOUNTER — Encounter (HOSPITAL_BASED_OUTPATIENT_CLINIC_OR_DEPARTMENT_OTHER): Payer: Self-pay | Admitting: Family Medicine

## 2021-12-10 DIAGNOSIS — Z6837 Body mass index (BMI) 37.0-37.9, adult: Secondary | ICD-10-CM

## 2021-12-10 MED ORDER — SEMAGLUTIDE-WEIGHT MANAGEMENT 0.5 MG/0.5ML SC SOAJ
0.5000 mg | SUBCUTANEOUS | 0 refills | Status: DC
Start: 2021-12-10 — End: 2022-01-07

## 2021-12-10 NOTE — Telephone Encounter (Signed)
Message from [facility]:   From: [name]   Sent: Mon Dec 10, 2021 3:38 PM   To: Centralized Refill Pool  Subject: Medication Renewal Request  Refills have been requested for the following medications:   Other - Wegovy next dose    Preferred pharmacy: [facility], [place]

## 2021-12-10 NOTE — Telephone Encounter (Signed)
PER Patient (self), Priscilla Williams is a 51 year old female has requested a refill of-  wegovy       Last Office Visit: 10/19/21 with cheung, j  Last Physical Exam: 09/12/15     PAP SMEAR due on 01/22/2018  HPV SCREENING due on 01/22/2018     Other Med Adult:  Most Recent BP Reading(s)  02/19/21 : 135/83        Cholesterol (mg/dL)   Date Value   02/19/2021 235     LOW DENSITY LIPOPROTEIN DIRECT (mg/dL)   Date Value   02/19/2021 138     HIGH DENSITY LIPOPROTEIN (mg/dL)   Date Value   02/19/2021 89 (H)     TRIGLYCERIDES (mg/dL)   Date Value   02/19/2021 94         THYROID SCREEN TSH REFLEX FT4 (uIU/mL)   Date Value   03/26/2019 0.786         TSH (THYROID STIM HORMONE) (uIU/mL)   Date Value   07/31/2015 1.290       HEMOGLOBIN A1C (%)   Date Value   03/26/2019 5.0       No results found for: POCA1C      No results found for: INR    SODIUM (mmol/L)   Date Value   02/19/2021 138       POTASSIUM (mmol/L)   Date Value   02/19/2021 4.3           CREATININE (mg/dL)   Date Value   02/19/2021 0.7        Documented patient preferred pharmacies:    [facility] - [place], [place]  Phone: [phone] Fax: [phone]

## 2021-12-10 NOTE — Telephone Encounter (Incomplete)
Priscilla Williams [id number], 51 year old, female         Calls today:  Clinical Questions ([place])    Name of person calling Patient  Specific nature of request pt calling to schedule Med F/U appt 4 weeks from 12/04/21/ no available appt/ please accommodate, thank you  Return phone number [phone]    Person calling on behalf of patient: Patient (self)    Patient's language of care: English    Patient does not need an interpreter.    Patient's PCP: Danella Penton, MD    Primary Care Home Site:  [facility]

## 2021-12-28 DIAGNOSIS — Z1231 Encounter for screening mammogram for malignant neoplasm of breast: Secondary | ICD-10-CM | POA: Diagnosis not present

## 2022-01-03 ENCOUNTER — Encounter (HOSPITAL_BASED_OUTPATIENT_CLINIC_OR_DEPARTMENT_OTHER): Payer: Self-pay | Admitting: Family Medicine

## 2022-01-03 ENCOUNTER — Ambulatory Visit (HOSPITAL_BASED_OUTPATIENT_CLINIC_OR_DEPARTMENT_OTHER): Payer: Self-pay | Admitting: Registered Nurse

## 2022-01-03 NOTE — Progress Notes (Signed)
Priscilla Williams is a 51 year old female patient of Spargo, Elsie Lincoln, MD who is scheduled for a telemedicine visit for bee sting.    SUBJECTIVE:    Bee sting on 7/7 on right second toe.      Pt reports at the time of the sting she had pain as well as some lightheadedness and nausea    That subsided and she states she then started having severe pain    The severe pain subsided and she is now having a lot of itching and redness.    The itching is extending up her foot and sometimes tingling and itching into her calf    Denies any vesicles, drainage, bleeding, only slight tenderness to palpation, denies any signs of infection    Pt has a hx of reactions to insect bites, some of which she has had to go to the hospital for due to breathing issues    Had no breathing issues this time    No sob, cp, n/v/f,     Picture in mychart reviewed          Patient Active Problem List:     Migraine     Irritable bowel syndrome     Morbid obesity with BMI of 40.0-44.9, adult (HCC)     Nasal polyps     Osteoarthritis of cervical spine with myelopathy     Hepatic steatosis     Asymptomatic microscopic hematuria     Brain aneurysm     Essential hypertension     Maxillary sinus cyst     Lung nodule     RUQ abdominal pain     Family history of cardiac arrest    semaglutide-Weight Management (WEGOVY) 0.5 MG/0.5ML sc auto-injector, Inject 0.5 mLs under the skin once a week, Disp: 4 each, Rfl: 0  Cholecalciferol (VITAMIN D3) 25 MCG TABS, TAKE 1 TABLET BY MOUTH EVERY DAY, Disp: 90 tablet, Rfl: 3  lisinopril (ZESTRIL) 20 MG tablet, Take 1 tablet by mouth in the morning., Disp: 90 tablet, Rfl: 3  doxepin (SINEQUAN) 10 MG capsule, Take 1 capsule by mouth in the morning., Disp: 90 capsule, Rfl: 3    No current facility-administered medications on file prior to visit.    All medications reviewed with patient.  Review of Patient's Allergies indicates:  No Known Allergies  Social History    Tobacco Use      Smoking status: Never      Smokeless tobacco:  Never    Alcohol use: Yes      Comment: 1 drink every 6 months, wine    Drug use: No    Medical/Surgical/Family History reviewed with the patient.  Relevant changes have been made in 'history' section.    Recent laboratories and imaging reviewed prior to visit.    Review of Systems/HPI:    As above    All other systems reviewed and negative.    OBJECTIVE:    Vital Signs:  Vitals not collected for this telemedicine visit.    Physical Exam:  General: speech clear at appropriate rate, speaking in full sentences  Skin: erythema and swelling of right second toe seen in mychart picture  Psychiatric:  Mood and behavior normal     ASSESSMENT AND PLAN:  Additional plans reviewed with patient and listed below under patient instructions and provided in AVS.    1. Allergic reaction to insect sting, accidental or unintentional, initial encounter    Determined to be large localized reaction to insect  sting    Prednisone rx    Take as prescribed    Advised patient if symptoms continue after start of prednisone to seek in person care in ER    Additionally if symptoms worsen or new ones develop report immediately to ER    Pt verbalized understanding    Discussed pt scheduling in person appointment for rx and training of epipen due to repeat allergic reactions to stings    Pt will schedule      - predniSONE (DELTASONE) 10 MG tablet; Take 4 tabs by mouth daily x 3d, 3 tabs x 3d, 2 tabs x 3d, 1 tab x3d  Dispense: 30 tablet; Refill: 0            Reasons to call or return to clinic were discussed.    I explained the diagnosis and treatment plan, and the patient/parent/guardian expressed understanding of the content. We discussed all medicines prescribed and the importance of medication adherence. The patient/parent/guardian expressed understanding and no barriers to adherence were identified.  Possible side effects of the prescribed medication(s) were explained.  I attempted to answer all questions regarding the diagnosis and the  proposed treatment.    We discussed the patient's current medications including proper use and potential side effects. The patient expressed understanding and no barriers to adherence were identified.     1. The patient indicates understanding of these issues and agrees with the plan. Brief care plan is updated and reviewed with the patient.   2. The patient is given an After Visit Summary sheet that lists all medications with directions, allergies, orders placed during this encounter, and follow-up instructions.   3. I reviewed the patient's medical information and medical history   4. I reconciled the patient's medication list and prepared and supplied needed refills.   5. I have reviewed the past medical, family, and social history sections including the medications and allergies.    Evon Slack, APRN

## 2022-01-03 NOTE — Telephone Encounter (Signed)
Reason for Disposition  . SEVERE local itching (i.e., interferes with work, school, activities) and not improved after 24 hours of hydrocortisone cream    Answer Assessment - Initial Assessment Questions  Bee or wasp bite  Cleaning deck on Friday.  Right foot second toe.  Was painful initially.  Reports swelling, redness, and itchiness since yesterday.  No stinger.  States she had a reaction in the past after an insect bit and advised she was allergic to them.  Patient states she had a reaction on Friday night, thought symptoms did not warrant a visit at that time.  The reaction symptoms included nausea, lightheadedness, and pain.  Self limiting at that time.  Applying ice, hydrocortisone, elevating, and taking Benadryl.      Denies fevers, difficulty breathing, facial/mouth swelling, n/v/d, hives, vomiting.    Protocols used: ADULT INSECT BITE-A-OH    Priority: Urgent    Advised per nursing triage protocol.  Verbalized understanding and agreement with instructions and disposition.     Recommended disposition for patient:Disposition: See in Office within 3 days    Instructed patient to call back for any new, worsening, or worrisome symptoms or concerns any time day or night.

## 2022-01-03 NOTE — Telephone Encounter (Signed)
Regarding: allergice reaction  ----- Message from [facility] sent at 01/03/2022 12:55 PM EDT -----  Priscilla Williams [id number], 51 year old, female    Calls today:  Sick    What are the symptoms  pt was stung by bee or wasp on Friday / having an allergic reaction / itchiness / swelling / redness / denied any other sym./denied 911/ tx to [id number]     Person calling on behalf of patient: Patient (self)    CALL BACK NUMBER: [phone]      Patient's language of care: English    Patient does not need an interpreter.    Patient's PCP: Danella Penton, MD    Primary Care Home Site:  [place]

## 2022-01-04 ENCOUNTER — Encounter (HOSPITAL_BASED_OUTPATIENT_CLINIC_OR_DEPARTMENT_OTHER): Payer: Self-pay | Admitting: Family

## 2022-01-04 ENCOUNTER — Ambulatory Visit: Payer: BC Managed Care – HMO | Attending: Family Medicine | Admitting: Family

## 2022-01-04 DIAGNOSIS — T63481A Toxic effect of venom of other arthropod, accidental (unintentional), initial encounter: Secondary | ICD-10-CM | POA: Diagnosis not present

## 2022-01-04 MED ORDER — PREDNISONE 10 MG PO TABS
ORAL_TABLET | ORAL | 0 refills | Status: AC
Start: 2022-01-04 — End: 2022-01-16

## 2022-01-05 NOTE — Telephone Encounter (Signed)
Emailed patient and forwarded message to Charolette Forward for review.

## 2022-01-09 ENCOUNTER — Other Ambulatory Visit: Payer: Self-pay | Admitting: Family Medicine

## 2022-01-18 ENCOUNTER — Telehealth (HOSPITAL_BASED_OUTPATIENT_CLINIC_OR_DEPARTMENT_OTHER): Payer: Self-pay | Admitting: Family Medicine

## 2022-01-18 DIAGNOSIS — Z6837 Body mass index (BMI) 37.0-37.9, adult: Secondary | ICD-10-CM

## 2022-01-18 MED ORDER — SEMAGLUTIDE-WEIGHT MANAGEMENT 1 MG/0.5ML SC SOAJ
1.0000 mg | SUBCUTANEOUS | 0 refills | Status: DC
Start: 2022-01-18 — End: 2022-02-15

## 2022-01-18 NOTE — Telephone Encounter (Signed)
Hello,    Patient called stated that she finish the WEGOVY) 0.5 MG and now she need new rx for the next dose up    Please review

## 2022-02-16 ENCOUNTER — Other Ambulatory Visit (HOSPITAL_BASED_OUTPATIENT_CLINIC_OR_DEPARTMENT_OTHER): Payer: Self-pay | Admitting: Family Medicine

## 2022-02-16 DIAGNOSIS — K580 Irritable bowel syndrome with diarrhea: Secondary | ICD-10-CM

## 2022-02-16 NOTE — Telephone Encounter (Signed)
PER Patient (self), Priscilla Williams is a 51 year old female has requested a refill of Doxepin      Last Office Visit: 01/04/2022 with lynch  Last Physical Exam: 09/12/2015    PAP SMEAR due on 01/22/2018  HPV SCREENING due on 01/22/2018    Other Med Adult:  Most Recent BP Reading(s)  02/19/21 : 135/83        Cholesterol (mg/dL)   Date Value   02/19/2021 235     LOW DENSITY LIPOPROTEIN DIRECT (mg/dL)   Date Value   02/19/2021 138     HIGH DENSITY LIPOPROTEIN (mg/dL)   Date Value   02/19/2021 89 (H)     TRIGLYCERIDES (mg/dL)   Date Value   02/19/2021 94         THYROID SCREEN TSH REFLEX FT4 (uIU/mL)   Date Value   03/26/2019 0.786         TSH (THYROID STIM HORMONE) (uIU/mL)   Date Value   07/31/2015 1.290       HEMOGLOBIN A1C (%)   Date Value   03/26/2019 5.0       No results found for: POCA1C      No results found for: INR    SODIUM (mmol/L)   Date Value   02/19/2021 138       POTASSIUM (mmol/L)   Date Value   02/19/2021 4.3           CREATININE (mg/dL)   Date Value   02/19/2021 0.7       Documented patient preferred pharmacies:

## 2022-02-18 ENCOUNTER — Encounter (HOSPITAL_BASED_OUTPATIENT_CLINIC_OR_DEPARTMENT_OTHER): Payer: Self-pay | Admitting: Family Medicine

## 2022-02-18 DIAGNOSIS — K580 Irritable bowel syndrome with diarrhea: Secondary | ICD-10-CM

## 2022-02-18 DIAGNOSIS — Z6837 Body mass index (BMI) 37.0-37.9, adult: Secondary | ICD-10-CM

## 2022-02-19 NOTE — Telephone Encounter (Signed)
PER Patient (self), Priscilla Williams is a 51 year old female has requested a refill of   semaglutide-Weight Management (WEGOVY) 1 MG/0.5ML sc auto-injector         Documented patient preferred pharmacies:    [place], [place]  Phone: [phone] Fax: [phone]

## 2022-02-19 NOTE — Telephone Encounter (Signed)
From: [name]  To: Office of TESS Lang Snow, MD  Sent: 02/18/2022 9:42 PM EDT  Subject: Medication Renewal Request    Refills have been requested for the following medications:   Other - Wegovy the next dose I am finishing tonight the '1mg'$      Preferred pharmacy: [facility], [place]

## 2022-02-20 ENCOUNTER — Encounter (HOSPITAL_BASED_OUTPATIENT_CLINIC_OR_DEPARTMENT_OTHER): Payer: Self-pay | Admitting: Family Medicine

## 2022-02-21 MED ORDER — SEMAGLUTIDE-WEIGHT MANAGEMENT 1.7 MG/0.75ML SC SOAJ
1.7000 mg | SUBCUTANEOUS | 0 refills | Status: DC
Start: 2022-02-21 — End: 2022-04-05

## 2022-02-22 ENCOUNTER — Encounter (HOSPITAL_BASED_OUTPATIENT_CLINIC_OR_DEPARTMENT_OTHER): Payer: Self-pay | Admitting: Family Medicine

## 2022-02-22 NOTE — Prior Authorization (Signed)
Patient Insurance: [employer]  Member ID: [id number]  BIN: [id number]  PCN: [facility]U  Group: [id number]  Prior Authorization for: [employer]

## 2022-02-22 NOTE — Telephone Encounter (Signed)
Emailed patient and forwarded message to PCP and Central Refill for review.

## 2022-02-23 NOTE — Telephone Encounter (Signed)
Emailed patient and forwarded message to PCP and Central Refill for review of PA.

## 2022-02-26 NOTE — Prior Authorization (Signed)
Phone:    Filled out Prior Authorization Request form for: wegovy 1.7     Form does not require provider signature - already faxed form to insurance company for review.  Scanned form into Epic under Environmental health practitioner - PA for: wegovy 1.7            Phone:    Filled out Prior Authorization Request form for: wegovy 1.7     Form does not require provider signature - already faxed form to insurance company for review.  Scanned form into Epic under Environmental health practitioner - PA for: wegovy 1.7

## 2022-02-28 ENCOUNTER — Telehealth (HOSPITAL_BASED_OUTPATIENT_CLINIC_OR_DEPARTMENT_OTHER): Payer: Self-pay

## 2022-02-28 NOTE — Telephone Encounter (Signed)
Priscilla Williams [id number], 51 year old, female    Calls today:  Clinical Questions ([place])    Name of person calling Lily from Pharmacy   Specific nature of request Caller states the PA they received is denied because they don't have pt most recent height and weight ,   EPN [phone]  Return phone number [phone]    Person calling on behalf of patient: Pharmacy    CALL BACK NUMBER:  [place] time to call back: ASAP  Cell phone:   Other phone:    Patient's language of care: English    Patient does not need an interpreter.    Patient's PCP: Danella Penton, MD    Primary Care Home Site:  [facility]

## 2022-03-01 NOTE — Prior Authorization (Signed)
Status:    Prior Authorization Status: Denied           Denied:    The prior authorization has been denied.  The denial notice has been scanned into Environmental health practitioner.     Denial Reason(s): Prior Authorization Denied                   No Go or Cancelled:    No Go or Canceled Prescription: Other  Other - Comment: Insurance will be faxing over denial

## 2022-03-04 NOTE — Telephone Encounter (Signed)
Patient's most recent height and weight were indicated in the P.A but will resubmit and follow with [employer] regarding denial

## 2022-03-06 NOTE — Telephone Encounter (Signed)
Called Lily with [employer] and gave patient updated weight for PA. Lily confirmed completed form.     Kayron Hicklin Lang Snow, MD, 03/06/2022

## 2022-03-14 ENCOUNTER — Ambulatory Visit: Payer: BC Managed Care – HMO | Attending: Family Medicine | Admitting: Family Medicine

## 2022-03-14 ENCOUNTER — Other Ambulatory Visit: Payer: Self-pay

## 2022-03-14 ENCOUNTER — Encounter (HOSPITAL_BASED_OUTPATIENT_CLINIC_OR_DEPARTMENT_OTHER): Payer: Self-pay | Admitting: Family Medicine

## 2022-03-14 VITALS — BP 128/85 | HR 78 | Temp 97.0°F | Ht 60.0 in | Wt 193.0 lb

## 2022-03-14 DIAGNOSIS — Z803 Family history of malignant neoplasm of breast: Secondary | ICD-10-CM | POA: Insufficient documentation

## 2022-03-14 DIAGNOSIS — T7840XD Allergy, unspecified, subsequent encounter: Secondary | ICD-10-CM | POA: Diagnosis not present

## 2022-03-14 DIAGNOSIS — G43109 Migraine with aura, not intractable, without status migrainosus: Secondary | ICD-10-CM | POA: Insufficient documentation

## 2022-03-14 DIAGNOSIS — E559 Vitamin D deficiency, unspecified: Secondary | ICD-10-CM | POA: Diagnosis not present

## 2022-03-14 DIAGNOSIS — Z0000 Encounter for general adult medical examination without abnormal findings: Secondary | ICD-10-CM

## 2022-03-14 DIAGNOSIS — Z1283 Encounter for screening for malignant neoplasm of skin: Secondary | ICD-10-CM | POA: Insufficient documentation

## 2022-03-14 DIAGNOSIS — R911 Solitary pulmonary nodule: Secondary | ICD-10-CM | POA: Insufficient documentation

## 2022-03-14 DIAGNOSIS — I6521 Occlusion and stenosis of right carotid artery: Secondary | ICD-10-CM | POA: Diagnosis not present

## 2022-03-14 DIAGNOSIS — Z8041 Family history of malignant neoplasm of ovary: Secondary | ICD-10-CM | POA: Insufficient documentation

## 2022-03-14 DIAGNOSIS — Z8249 Family history of ischemic heart disease and other diseases of the circulatory system: Secondary | ICD-10-CM | POA: Diagnosis not present

## 2022-03-14 DIAGNOSIS — I10 Essential (primary) hypertension: Secondary | ICD-10-CM | POA: Diagnosis not present

## 2022-03-14 MED ORDER — EPINEPHRINE 0.3 MG/0.3ML AUTO-INJECTOR
0.3000 mg | Freq: Once | INTRAMUSCULAR | 1 refills | Status: DC | PRN
Start: 2022-03-14 — End: 2023-11-21

## 2022-03-14 MED ORDER — VITAMIN D3 25 MCG PO TABS
1.0000 | ORAL_TABLET | Freq: Every day | ORAL | 3 refills | Status: DC
Start: 2022-03-14 — End: 2023-03-19

## 2022-03-14 MED ORDER — BUTALBITAL-APAP-CAFFEINE 50-325-40 MG PO TABS
1.00 | ORAL_TABLET | Freq: Four times a day (QID) | ORAL | 0 refills | Status: AC | PRN
Start: 2022-03-14 — End: 2022-03-17

## 2022-03-14 NOTE — Patient Instructions (Signed)
Please do the following after your visit:       Labs: main hallway     Referrals: [place]        Radiology: CT scan [phone]

## 2022-03-14 NOTE — Progress Notes (Signed)
[facility] [facility]    CC: CPEX    LANGUAGE: English used effectively by patient.    HPI: Priscilla Williams is a 51 year old who presents for a physical exam and  to discuss        # Migraine  Taking fiorcet as needed  Needs refill    # Cervical radiculopathy  Neurology following  Possibly needing revision to back discs  Gabapentin prn for paresthesias      # Weight management  Now at 1.7 week of Wegovy  ~ 40lbs down overall -- 25 of it with lifestyle prior to starting  Some nausea w dose increase that resides     # Allergic rxn  Got bitten on the foot, toe swelling, numbness and tingling up leg      # SRH history  -  OB History   G5  P4  T4  P0  A1  L0    SAB1  IAB0  Ectopic0  Molar0  Multiple0  Live Births0   Postmenopausal   LMP 2022      ROS  Denies  -Fevers, night sweats, headaches, vision changes  -Cough, SOB  -Chest pain  -Nausea, vomiting, diarrhea, constipation, abdominal pain  -Dysuria, increased frequency  -Rashes, joint pain    PMH  Patient's past medical history was reviewed and updated in the chart    Social Hx  Patient's social history was reviewed and updated in the chart    Family Hx  Patient's family history was reviewed and updated in the chart    semaglutide-Weight Management (WEGOVY) 1.7 MG/0.75ML sc auto-injector, Inject 0.75 mLs under the skin once a week, Disp: 4 each, Rfl: 0  doxepin (SINEQUAN) 10 MG capsule, Take 1 capsule by mouth in the morning., Disp: 90 capsule, Rfl: 3  Cholecalciferol (VITAMIN D3) 25 MCG TABS, TAKE 1 TABLET BY MOUTH EVERY DAY, Disp: 90 tablet, Rfl: 3  lisinopril (ZESTRIL) 20 MG tablet, Take 1 tablet by mouth in the morning., Disp: 90 tablet, Rfl: 3    No facility-administered encounter medications on file as of 03/14/2022.      Review of Patient's Allergies indicates:  No Known Allergies    OBJECTIVE:  Vitals: BP 128/85   Pulse 78   Temp 97 F (36.1 C) (Temporal)   Ht 5' (1.524 m)   Wt 87.5 kg (193 lb)   SpO2 99%   BMI 37.69 kg/m   Most Recent Weight  Reading(s)  03/14/22 : 87.5 kg (193 lb)  02/19/21 : 101 kg (222 lb 9.6 oz)  11/02/20 : 99.3 kg (219 lb)    General: NAD, well appearing  HEENT: PERRLA, EOMI, sclera/conjunctiva clear, oropharynx clear, TM gray/translucent  Neck: No cervical LAD, thyroid not palpable  CV: Regular rate, normal rhythm, no murmurs heard  Resp: Lungs CTAB, no wheeze, no crackles, good air entry  Abd: Soft, non-tender, non-distended, no organomegaly, +BS  Ext: 2+ pulses in all extremities, no edema  Neuro: AO x 3. CN 2-12 grossly intact, 5/5 strength in all extremities, sensation grossly intact, 2+ patellar reflexes. Normal gait.   Skin: No rashes or lesions  Alert, oriented, thought content appropriate      ASSESSMENT/PLAN:   Priscilla Williams is a 51 year old who presents today for a routine physical examination.  - Immunizations: discussed  - DM screening: as ordered.  - HTN screening: at goal.  - Lipids: as orderd.  - Dentist: discussed.  - Vision: discussed.  - Pap:  followed by outside provider  - Mammogram: UTD .  - Colonoscopy/IFOB: UTD.  - Lung Cancer: as ordered.  - Lifestyle: discussed diet and exercise; encouraged increasing vegetables, fruits, and water in diet and decreasing red meats and carbohydrates; also encouraged increasing physical activity.    Essential hypertension  BP controlled w lisinopril 20. Fmhx of CVA, CAD. Experiencing presyncope and palpitations.  Mild L carotid atherosclerosis on duplex 03/2021. 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 Referral to cardiology for further discussion and mgmt.     Lung nodule  Follow up imaging ordered    Occlusion of right carotid artery  See HTN A and P    Migraine  Refilled abortive med    1. Annual visit for general adult medical examination without abnormal findings  - REFERRAL TO DERMATOLOGY (EXT)  - REFERRAL TO ENT (INT)  - BASIC METABOLIC PANEL; Future  - MICROALBUMIN RANDOM URINE; Future    2. Family history of breast cancer  3.  Family history of ovarian cancer  4. Skin exam, screening for cancer  - REFERRAL TO DERMATOLOGY (EXT)    5. Migraine with aura and without status migrainosus, not intractable  - butalbital-acetaminophen-caffeine (FIORICET) per tablet; Take 1 tablet by mouth every 6 (six) hours as needed for Pain  for up to 3 days  Dispense: 12 tablet; Refill: 0    6. Allergic reaction, subsequent encounter  - EPINEPHrine (EPIPEN) 0.3 MG/0.3ML injection; Inject 0.3 mg into the muscle once as needed  for up to 1 dose  Dispense: 0.6 mL; Refill: 1    7. Vitamin D deficiency  - Cholecalciferol (VITAMIN D3) 25 MCG TABS; Take 1 tablet by mouth in the morning.  Dispense: 90 tablet; Refill: 3    8. Occlusion of right carotid artery  - REFERRAL TO CARDIOLOGY (INT)    9. Essential hypertension  - REFERRAL TO CARDIOLOGY (INT)  - BASIC METABOLIC PANEL; Future  - MICROALBUMIN RANDOM URINE; Future    10. Family history of cardiac arrest  - REFERRAL TO CARDIOLOGY (INT)    11. Lung nodule  - CT LUNG NODULE FU WO CONTRAST; Future      I have reviewed the past medical, surgical, social and family history and updated these sections of EpicCare as relevant. All interim labs, test results, and consult notes were reviewed and discussed with Priscilla Williams. Medications were reconciled during this visit and a current medication list was given to the patient at the end of the visit. The patient expressed understanding and no barriers to adherence were identified.     Letta Pate, MD, 03/14/2022

## 2022-04-03 ENCOUNTER — Encounter (HOSPITAL_BASED_OUTPATIENT_CLINIC_OR_DEPARTMENT_OTHER): Payer: Self-pay

## 2022-04-05 ENCOUNTER — Other Ambulatory Visit (HOSPITAL_BASED_OUTPATIENT_CLINIC_OR_DEPARTMENT_OTHER): Payer: Self-pay | Admitting: Family Medicine

## 2022-04-05 DIAGNOSIS — I10 Essential (primary) hypertension: Secondary | ICD-10-CM

## 2022-04-05 DIAGNOSIS — Z6837 Body mass index (BMI) 37.0-37.9, adult: Secondary | ICD-10-CM

## 2022-04-05 MED ORDER — SEMAGLUTIDE-WEIGHT MANAGEMENT 1.7 MG/0.75ML SC SOAJ
1.7000 mg | SUBCUTANEOUS | 0 refills | Status: DC
Start: 2022-04-05 — End: 2022-04-05

## 2022-04-05 MED ORDER — SEMAGLUTIDE-WEIGHT MANAGEMENT 2.4 MG/0.75ML SC SOAJ
2.4000 mg | SUBCUTANEOUS | 1 refills | Status: DC
Start: 2022-04-05 — End: 2022-06-25

## 2022-04-05 MED ORDER — LISINOPRIL 20 MG PO TABS
20.0000 mg | ORAL_TABLET | Freq: Every day | ORAL | 3 refills | Status: DC
Start: 2022-04-05 — End: 2023-04-11

## 2022-04-05 NOTE — Telephone Encounter (Signed)
PER Patient (self), Priscilla Williams is a 51 year old female has requested a refill of wegovy 1.7 and lisinopril.    SHE WOULD LIKE TO PICK UP TODAY IF POSSIBLE. PHARMACY IS CLOSE SUNDAY      Last Office Visit: [id number] with Saverio Danker  Last Physical Exam: [id number]      Other Med Adult:  Most Recent BP Reading(s)  03/14/22 : 128/85        Cholesterol (mg/dL)   Date Value   02/19/2021 235     LOW DENSITY LIPOPROTEIN DIRECT (mg/dL)   Date Value   02/19/2021 138     HIGH DENSITY LIPOPROTEIN (mg/dL)   Date Value   02/19/2021 89 (H)     TRIGLYCERIDES (mg/dL)   Date Value   02/19/2021 94         THYROID SCREEN TSH REFLEX FT4 (uIU/mL)   Date Value   03/26/2019 0.786         TSH (THYROID STIM HORMONE) (uIU/mL)   Date Value   07/31/2015 1.290       HEMOGLOBIN A1C (%)   Date Value   03/26/2019 5.0       No results found for: POCA1C      No results found for: INR    SODIUM (mmol/L)   Date Value   02/19/2021 138       POTASSIUM (mmol/L)   Date Value   02/19/2021 4.3           CREATININE (mg/dL)   Date Value   02/19/2021 0.7       Documented patient preferred pharmacies:    [facility] - [place], [place]  Phone: [phone] Fax: [phone]

## 2022-04-30 DIAGNOSIS — Z713 Dietary counseling and surveillance: Secondary | ICD-10-CM | POA: Diagnosis not present

## 2022-05-07 ENCOUNTER — Other Ambulatory Visit: Payer: Self-pay

## 2022-05-07 ENCOUNTER — Ambulatory Visit: Payer: BC Managed Care – HMO | Attending: Otolaryngology | Admitting: Otolaryngology

## 2022-05-07 DIAGNOSIS — J342 Deviated nasal septum: Secondary | ICD-10-CM | POA: Insufficient documentation

## 2022-05-07 DIAGNOSIS — R0981 Nasal congestion: Secondary | ICD-10-CM | POA: Insufficient documentation

## 2022-05-07 DIAGNOSIS — J320 Chronic maxillary sinusitis: Secondary | ICD-10-CM | POA: Diagnosis present

## 2022-05-07 MED ORDER — FLUTICASONE PROPIONATE 50 MCG/ACT NA SUSP
2.00 | Freq: Every day | NASAL | 3 refills | Status: AC
Start: 2022-05-07 — End: 2022-08-05

## 2022-05-07 NOTE — Progress Notes (Signed)
I interviewed and examined the patient personally. I agree with the assessment and plan of Bill Salinas, PA-C.    HPI and Exam-  Pertinent positives include symptoms as noted as well as following physical exam findings-  Exam: Nasal endoscopy does show severely deviated septum anteriorly and with left MT completely lateralized inferiorly, no polyps or mucopus seen    Plan-  Agree with Katelyn's assessment that patient needs the following:  -The patient was counseled on the possible side effects of second generation antihistamines (loratadine, fexofenadine, cetirizine, levocetirizine) including the possibility of somnolence, headache, fatigue, diarrhea. The risks vs. benefits were discussed regarding the uses of these medication for patients with allergic rhinitis. We counseled them that if they do not try this medication for at least a month, symptoms may worsen and they will have a severe allergy exacerbation and this may impact their quality of life (eg. Sneezing, itchy eyes, runny nose).   It was recommended that the patient reach out to [place] if they are unable to tolerate the medication due to any side effects.       Claudie Revering, MD, MBA  Otolaryngology- Head and Neck Surgery

## 2022-05-07 NOTE — Progress Notes (Signed)
HPI: 51 year old female Priscilla Williams is following up today for nasal congestion and facial pain/pressure. Previously seen by Dr. Marvetta Gibbons anf had ESS in 11/02/20.    Pt reports 6 months after surgery started having facial pain/pressure in the left maxillary sinus and feels there is some mucus on the left side. Reports needing to use Vick's nasal spray daily. Reports migraine symptoms. Reports left sided congestion.       Previous imaging from before surgery on 07/07/20 shows:  FINDINGS:     Frontal sinuses and frontal recesses: Hypoplastic and clear.     Maxillary sinuses: There is near complete opacification of the left maxillary sinus which may represent a mucous retention cyst or polyp. There is a small mucous retention cyst at the base of the right maxillary sinus. Very small cortical defects versus dehiscence is noted in the medial walls of the maxillary sinuses.     Ostiomeatal complexes: Patent on the right. Opacified on the left.      Ethmoid sinuses: Minimal scattered opacification.     Sphenoid sinuses and sphenoethmoidal recesses: Unremarkable     Post-surgical changes: None.     Mastoids: Clear.     Nasal cavity/septum: Mild left nasal septal deviation anteriorly. Right concha bullosa.     Orbits: Unremarkable.     Visualized brain: Grossly unremarkable.     Bones: Unremarkable.     Other findings: None.     IMPRESSION:     CT of the paranasal sinuses reveals:  1.  Large left maxillary sinus mucus retention cyst versus polyp nearly completely opacifies the left maxillary sinus.  2.  Very small cortical defects versus dehiscence of the medial walls of the maxillary sinuses.    Note from surgery    DESCRIPTION OF PROCEDURE:  The patient was brought into the operating room and   placed supine on the operating room table.  General anesthesia was induced   and the patient was orotracheally intubated.  The CT guidance system was set   up and utilized throughout the case.  The patient was prepped and draped in    the standard fashion.  Epinephrine-soaked pledgets were placed into the left   naris for decongestion.  These were removed and a 0-degree endoscope was used   to visualize the left nasal cavity.  The middle turbinate was medialized with   a Freer.  The uncinate process was identified and removed with a backbiter.  A   large mucous retention cyst was seen emanating from the left maxillary sinus   and partially filling the left posterior nasal cavity with a combination of   cystic material and polypoid material.  The polypoid cystic material in the   nasal cavity was removed first.  Thereafter, the mucous retention cyst in the   left maxillary sinus was grasped and removed.  There was no purulence   encountered.  There was no bleeding at the end of the case.  The patient was   awakened and extubated in the operating room, having tolerated the procedure   well and was transported to recovery in stable condition.  Past Medical History:  No date: Anemia  No date: Headache  No date: History of hypertension  No date: IBS (irritable bowel syndrome)  03/22/2010: Nasal polyps  No date: Obesity    Review of Patient's Allergies indicates:   Wasp venom protein      Swelling    Comment:Other reaction(s): SWELLING,NAUSEA,SOB    lisinopril (ZESTRIL) 20  MG tablet, Take 1 tablet by mouth in the morning., Disp: 90 tablet, Rfl: 3  semaglutide-Weight Management (WEGOVY) 2.4 MG/0.75ML sc auto-injector, Inject 0.75 mLs under the skin once a week, Disp: 4 each, Rfl: 1  Cholecalciferol (VITAMIN D3) 25 MCG TABS, Take 1 tablet by mouth in the morning., Disp: 90 tablet, Rfl: 3  gabapentin (NEURONTIN) 100 MG capsule, Take 1 capsule by mouth every 8 (eight) hours, Disp: , Rfl:   doxepin (SINEQUAN) 10 MG capsule, Take 1 capsule by mouth in the morning., Disp: 90 capsule, Rfl: 3    No current facility-administered medications on file prior to visit.      Social and family history reviewed either in EPIC or with patient and are not contributory  unless specifically noted in HPI.      Review of Systems  Constitutional normal.  Eyes/Blurry vision No.  CV/Palpitations No.  Resp/SOB No.  Endocrine/Feeling tired No.      Physical Exam  There were no vitals filed for this visit.    General: WD WN female with a normal voice.  Face: No lesions.  Facial Strength: Normal and symmetric.  Eyes: PERRLA and EOMI.  Ear R: No lesions. Ear L:  No lesions.   Nose:  Septum deviated to the left  Turbinates Hypertrophic:  left.  No polyps or masses.  Nasopharynx:  CNV    Oral Cavity/Oropharynx:  Mucous membranes moist.  Tonsils 0+.  Teeth in good condition.  Tongue no lesions.  Neck:  Trachea midline.  No palpable masses.        Procedure: Nasal Endoscopy  Indications: Hx of nasal congetsion and hx of maxillary antrostomy of left side, need to evaluate nasal anatomy not easily seen on anterior rhinoscopy  Verbal consent is obtained.   The nose was anesthetized with topical Afrin/Lidocaine bilaterally  FOE/0 degree Hopkins Rod into Right and Left Nasal Passage  Findings: Nasal mucosa inflamed, septum deviated left, left MT lateralized with decreased room. 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 Can consider ESS and septoplasty depending on results. Pt will start sinus rinsing and using flonase.   Plan: CT SINUS WO CONTRAST, fluticasone (FLONASE) 50         MCG/ACT nasal spray      Follow up to review ct sinus     Bill Salinas, PA-C  Department of Otolaryngology   05/07/22

## 2022-05-22 ENCOUNTER — Ambulatory Visit
Admission: RE | Admit: 2022-05-22 | Discharge: 2022-05-22 | Disposition: A | Discharge status: Home / Self Care | Payer: BC Managed Care – HMO | Attending: Student in an Organized Health Care Education/Training Program | Admitting: Student in an Organized Health Care Education/Training Program

## 2022-05-22 ENCOUNTER — Other Ambulatory Visit: Payer: Self-pay

## 2022-05-22 DIAGNOSIS — J320 Chronic maxillary sinusitis: Secondary | ICD-10-CM

## 2022-05-22 DIAGNOSIS — J329 Chronic sinusitis, unspecified: Secondary | ICD-10-CM | POA: Diagnosis present

## 2022-05-22 DIAGNOSIS — J342 Deviated nasal septum: Secondary | ICD-10-CM

## 2022-05-22 DIAGNOSIS — R0981 Nasal congestion: Secondary | ICD-10-CM

## 2022-06-06 ENCOUNTER — Encounter (HOSPITAL_BASED_OUTPATIENT_CLINIC_OR_DEPARTMENT_OTHER): Payer: Self-pay | Admitting: Otolaryngology

## 2022-06-08 DIAGNOSIS — K8020 Calculus of gallbladder without cholecystitis without obstruction: Secondary | ICD-10-CM | POA: Diagnosis not present

## 2022-06-21 ENCOUNTER — Other Ambulatory Visit: Payer: Self-pay

## 2022-06-24 ENCOUNTER — Encounter (HOSPITAL_BASED_OUTPATIENT_CLINIC_OR_DEPARTMENT_OTHER): Payer: Self-pay | Admitting: Family Medicine

## 2022-06-24 DIAGNOSIS — Z6837 Body mass index (BMI) 37.0-37.9, adult: Secondary | ICD-10-CM

## 2022-06-25 NOTE — Telephone Encounter (Signed)
PER Patient (self), Priscilla Williams is a 52 year old female has requested a refill of      -  wegovy      Last Office Visit: 03/14/2022 with Nelta Numbers  Last Physical Exam: 09/12/2015     PAP SMEAR due on 01/22/2018  HPV SCREENING due on 01/22/2018     Other Med Adult:  Most Recent BP Reading(s)  03/14/22 : 128/85        Cholesterol (mg/dL)   Date Value   02/19/2021 235     LOW DENSITY LIPOPROTEIN DIRECT (mg/dL)   Date Value   02/19/2021 138     HIGH DENSITY LIPOPROTEIN (mg/dL)   Date Value   02/19/2021 89 (H)     TRIGLYCERIDES (mg/dL)   Date Value   02/19/2021 94         THYROID SCREEN TSH REFLEX FT4 (uIU/mL)   Date Value   03/26/2019 0.786         TSH (THYROID STIM HORMONE) (uIU/mL)   Date Value   07/31/2015 1.290       HEMOGLOBIN A1C (%)   Date Value   03/26/2019 5.0       No results found for: "POCA1C"      No results found for: "INR"    SODIUM (mmol/L)   Date Value   02/19/2021 138       POTASSIUM (mmol/L)   Date Value   02/19/2021 4.3           CREATININE (mg/dL)   Date Value   02/19/2021 0.7        Documented patient preferred pharmacies:    [facility] - [address] [address], [place]  Phone: [phone] Fax: [phone]

## 2022-06-26 MED ORDER — SEMAGLUTIDE-WEIGHT MANAGEMENT 2.4 MG/0.75ML SC SOAJ
2.4000 mg | SUBCUTANEOUS | 1 refills | Status: DC
Start: 2022-06-26 — End: 2022-08-21

## 2022-06-27 ENCOUNTER — Encounter (HOSPITAL_BASED_OUTPATIENT_CLINIC_OR_DEPARTMENT_OTHER): Payer: Self-pay | Admitting: Family Medicine

## 2022-06-27 NOTE — Prior Authorization (Signed)
Patient Insurance: paid 340b  Member ID: [id number]  BIN: [id number]  Group: [id number]  Prior Authorization for: wegovy 2.'4mg'$       Continuous therapy - previously denied in September but no denial reason in encounter or in media -     Patient was previously approved for medication

## 2022-06-28 NOTE — Prior Authorization (Signed)
PA can't be submitted via CMM. Please submit form

## 2022-07-01 ENCOUNTER — Encounter (HOSPITAL_BASED_OUTPATIENT_CLINIC_OR_DEPARTMENT_OTHER): Payer: Self-pay | Admitting: Family Medicine

## 2022-07-01 NOTE — Assessment & Plan Note (Signed)
Follow up imaging ordered

## 2022-07-01 NOTE — Assessment & Plan Note (Signed)
Refilled abortive med

## 2022-07-01 NOTE — Assessment & Plan Note (Signed)
BP controlled w lisinopril 20. Fmhx of CVA, CAD. Experiencing presyncope and palpitations.  Mild L carotid atherosclerosis on duplex 03/2021. 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 Referral to cardiology for further discussion and mgmt.

## 2022-07-01 NOTE — Assessment & Plan Note (Signed)
See HTN A and P

## 2022-07-02 ENCOUNTER — Ambulatory Visit (HOSPITAL_BASED_OUTPATIENT_CLINIC_OR_DEPARTMENT_OTHER): Payer: BC Managed Care – HMO | Admitting: Otolaryngology

## 2022-07-02 ENCOUNTER — Encounter (HOSPITAL_BASED_OUTPATIENT_CLINIC_OR_DEPARTMENT_OTHER): Payer: Self-pay | Admitting: Otolaryngology

## 2022-07-02 NOTE — Prior Authorization (Signed)
Phone:    Filled out Prior Authorization Request form for: [employer]     Form does not require provider signature - already faxed form to insurance company for review.  Scanned form into Epic under Environmental health practitioner - PA for: [facility] 2.'4mg'$ 

## 2022-07-09 NOTE — Prior Authorization (Signed)
Status:    Prior Authorization Status: Approved  Type: Pharmacy       Pharmacy:    Start Date: 07/05/22  End Date: 08/05/22  Spoke to: pharmacy  Copay: 15     The patient and their preferred pharmacy are aware of the approval.  Approval notice has been scanned into Environmental health practitioner.

## 2022-07-12 ENCOUNTER — Encounter (HOSPITAL_BASED_OUTPATIENT_CLINIC_OR_DEPARTMENT_OTHER): Payer: Self-pay | Admitting: Family Medicine

## 2022-07-12 DIAGNOSIS — Z1283 Encounter for screening for malignant neoplasm of skin: Secondary | ICD-10-CM

## 2022-07-15 ENCOUNTER — Ambulatory Visit (HOSPITAL_BASED_OUTPATIENT_CLINIC_OR_DEPARTMENT_OTHER): Payer: BC Managed Care – HMO | Admitting: Family Medicine

## 2022-07-15 DIAGNOSIS — K8020 Calculus of gallbladder without cholecystitis without obstruction: Secondary | ICD-10-CM | POA: Diagnosis not present

## 2022-07-17 ENCOUNTER — Encounter (HOSPITAL_BASED_OUTPATIENT_CLINIC_OR_DEPARTMENT_OTHER): Payer: Self-pay | Admitting: Family Medicine

## 2022-07-19 ENCOUNTER — Ambulatory Visit (HOSPITAL_BASED_OUTPATIENT_CLINIC_OR_DEPARTMENT_OTHER): Payer: BC Managed Care – HMO | Admitting: Family Medicine

## 2022-07-23 DIAGNOSIS — R197 Diarrhea, unspecified: Secondary | ICD-10-CM | POA: Diagnosis not present

## 2022-07-23 DIAGNOSIS — R1011 Right upper quadrant pain: Secondary | ICD-10-CM | POA: Diagnosis not present

## 2022-07-23 DIAGNOSIS — K580 Irritable bowel syndrome with diarrhea: Secondary | ICD-10-CM | POA: Diagnosis not present

## 2022-07-23 DIAGNOSIS — K8020 Calculus of gallbladder without cholecystitis without obstruction: Secondary | ICD-10-CM | POA: Diagnosis not present

## 2022-08-10 DIAGNOSIS — K8020 Calculus of gallbladder without cholecystitis without obstruction: Secondary | ICD-10-CM | POA: Diagnosis not present

## 2022-08-21 ENCOUNTER — Encounter (HOSPITAL_BASED_OUTPATIENT_CLINIC_OR_DEPARTMENT_OTHER): Payer: Self-pay | Admitting: Family Medicine

## 2022-08-21 ENCOUNTER — Other Ambulatory Visit: Payer: Self-pay

## 2022-08-21 ENCOUNTER — Ambulatory Visit: Payer: BC Managed Care – HMO | Attending: Family Medicine | Admitting: Family Medicine

## 2022-08-21 VITALS — BP 123/84 | HR 77 | Temp 97.4°F | Ht 60.0 in | Wt 173.0 lb

## 2022-08-21 DIAGNOSIS — Z6833 Body mass index (BMI) 33.0-33.9, adult: Secondary | ICD-10-CM | POA: Diagnosis not present

## 2022-08-21 DIAGNOSIS — E669 Obesity, unspecified: Secondary | ICD-10-CM | POA: Insufficient documentation

## 2022-08-21 DIAGNOSIS — I10 Essential (primary) hypertension: Secondary | ICD-10-CM | POA: Diagnosis not present

## 2022-08-21 DIAGNOSIS — I671 Cerebral aneurysm, nonruptured: Secondary | ICD-10-CM | POA: Diagnosis not present

## 2022-08-21 MED ORDER — SEMAGLUTIDE-WEIGHT MANAGEMENT 2.4 MG/0.75ML SC SOAJ
2.4000 mg | SUBCUTANEOUS | 5 refills | Status: DC
Start: 2022-08-21 — End: 2023-03-05

## 2022-08-21 NOTE — Progress Notes (Signed)
Priscilla Williams  Office Visit Note   Subjective   Subjective:   LANGUAGE: English used effectively by patient.    Weight  Down 20 lbs since September  Still taking wegovy        I have reviewed the past medical, social, and family history. All pertinent historical factors are mentioned in the HPI. The remainder are non-contributory.      Objective:   BP 123/84   Pulse 77   Temp 97.4 F (36.3 C) (Temporal)   Ht 5' (1.524 m)   Wt 78.5 kg (173 lb)   LMP  (LMP Unknown)   SpO2 100%   BMI 33.79 kg/m     Physical Exam   General: Alert   Respiratory: CTAB, no wheeze, stridor, rales.  Cardiovascular: Regular rate and rhythm. normal   Abdominal: Normoactive bowel sounds; soft, non-tender, not distended. No rebound. No guarding.  Skin: Warm and dry. No evident rash, erythema, pallor      Assessment & Plan:     1. Brain aneurysm  No symptoms.  Followed at [facility] and has been seen recently    2. BMI 33.0-33.9,adult  Doing well on semaglutide.  Will  continue and f/u in 2-3 months.  Doing weight watchers as well.    3.  HTN  BP stable          The patient expressed understanding and no barriers to adherence were identified. Discussed reasons to call office or seek immediate medical care including: no improvement or worsening of symptoms, new concerns, and questions.  I spent a total of 24 minutes on this visit on the date of service (total time includes all activities performed on the date of service)  Tia Hieronymus A Bonnell-Bradley, PA-C 08/21/2022

## 2022-08-27 ENCOUNTER — Ambulatory Visit (HOSPITAL_BASED_OUTPATIENT_CLINIC_OR_DEPARTMENT_OTHER): Payer: BC Managed Care – HMO | Admitting: Otolaryngology

## 2022-08-28 ENCOUNTER — Ambulatory Visit (HOSPITAL_BASED_OUTPATIENT_CLINIC_OR_DEPARTMENT_OTHER): Payer: Self-pay | Admitting: Family Medicine

## 2022-08-28 NOTE — Telephone Encounter (Signed)
Call to follow up on the patient's mychart message  LVM with the clinic telephone number to call

## 2022-09-10 ENCOUNTER — Ambulatory Visit (HOSPITAL_BASED_OUTPATIENT_CLINIC_OR_DEPARTMENT_OTHER): Payer: BC Managed Care – HMO | Admitting: Otolaryngology

## 2022-10-04 DIAGNOSIS — K8020 Calculus of gallbladder without cholecystitis without obstruction: Secondary | ICD-10-CM | POA: Diagnosis not present

## 2022-10-04 DIAGNOSIS — I10 Essential (primary) hypertension: Secondary | ICD-10-CM | POA: Diagnosis not present

## 2022-10-04 DIAGNOSIS — I671 Cerebral aneurysm, nonruptured: Secondary | ICD-10-CM | POA: Diagnosis not present

## 2022-10-04 DIAGNOSIS — K660 Peritoneal adhesions (postprocedural) (postinfection): Secondary | ICD-10-CM | POA: Diagnosis not present

## 2022-10-04 DIAGNOSIS — K589 Irritable bowel syndrome without diarrhea: Secondary | ICD-10-CM | POA: Diagnosis not present

## 2022-10-04 HISTORY — PX: CHOLECYSTECTOMY: GID484

## 2022-10-16 ENCOUNTER — Ambulatory Visit (HOSPITAL_BASED_OUTPATIENT_CLINIC_OR_DEPARTMENT_OTHER): Payer: BC Managed Care – HMO | Admitting: Cardiovascular Disease

## 2022-10-18 DIAGNOSIS — R197 Diarrhea, unspecified: Secondary | ICD-10-CM | POA: Diagnosis not present

## 2022-11-12 ENCOUNTER — Encounter (HOSPITAL_BASED_OUTPATIENT_CLINIC_OR_DEPARTMENT_OTHER): Payer: Self-pay | Admitting: Family Medicine

## 2022-11-14 ENCOUNTER — Encounter (HOSPITAL_BASED_OUTPATIENT_CLINIC_OR_DEPARTMENT_OTHER): Payer: Self-pay | Admitting: Family Medicine

## 2022-11-14 NOTE — Prior Authorization (Signed)
Patient Insurance: paid 340b  Member ID: [id number]  BIN: [id number]  PCN: [id number]  Group: [id number]  Prior Authorization for: wegovy 2.4mg           Phone:    Filled out Prior Authorization Request form for: Wegovy 2.4mg   DX & ICD 10: BMI 33.0 - 33.9 Z68.33     Form does not require provider signature - already faxed form to insurance company for review.  Scanned form into Epic under Careers information officer - PA for: [facility] 2.4mg 

## 2022-11-14 NOTE — Prior Authorization (Signed)
Patient Insurance: paid 340b  Member ID: [id number]  BIN: [id number]  PCN: [id number]  Group: [id number]  Prior Authorization for: wegovy 2.4mg         please submit today and mark as urgent- patient due for dose tomorrow and pharmacy closed all weekend.

## 2022-11-25 NOTE — Prior Authorization (Signed)
Status:    Prior Authorization Status: Approved  Type: Pharmacy       Pharmacy:    PRIOR AUTHORIZATION has been approved for the following time frame: 5 months  Start Date: 11/14/22  End Date: 03/17/23  Spoke to: Lilly  Copay: 15     The patient and their preferred pharmacy are aware of the approval.  Approval notice has been scanned into Careers information officer.

## 2022-12-18 DIAGNOSIS — D2371 Other benign neoplasm of skin of right lower limb, including hip: Secondary | ICD-10-CM | POA: Diagnosis not present

## 2022-12-18 DIAGNOSIS — L814 Other melanin hyperpigmentation: Secondary | ICD-10-CM | POA: Diagnosis not present

## 2022-12-18 DIAGNOSIS — D225 Melanocytic nevi of trunk: Secondary | ICD-10-CM | POA: Diagnosis not present

## 2022-12-18 DIAGNOSIS — D2239 Melanocytic nevi of other parts of face: Secondary | ICD-10-CM | POA: Diagnosis not present

## 2022-12-27 DIAGNOSIS — Z713 Dietary counseling and surveillance: Secondary | ICD-10-CM | POA: Diagnosis not present

## 2023-02-03 ENCOUNTER — Encounter (HOSPITAL_BASED_OUTPATIENT_CLINIC_OR_DEPARTMENT_OTHER): Payer: Self-pay | Admitting: Family Medicine

## 2023-02-18 ENCOUNTER — Encounter (HOSPITAL_BASED_OUTPATIENT_CLINIC_OR_DEPARTMENT_OTHER): Payer: Self-pay | Admitting: Family Medicine

## 2023-02-18 DIAGNOSIS — K580 Irritable bowel syndrome with diarrhea: Secondary | ICD-10-CM

## 2023-02-19 ENCOUNTER — Other Ambulatory Visit (HOSPITAL_BASED_OUTPATIENT_CLINIC_OR_DEPARTMENT_OTHER): Payer: Self-pay | Admitting: Family Medicine

## 2023-02-19 DIAGNOSIS — K580 Irritable bowel syndrome with diarrhea: Secondary | ICD-10-CM

## 2023-02-19 MED ORDER — DOXEPIN HCL 10 MG PO CAPS
10.0000 mg | ORAL_CAPSULE | Freq: Every day | ORAL | 1 refills | Status: DC
Start: 2023-02-19 — End: 2023-08-16

## 2023-02-19 NOTE — Telephone Encounter (Signed)
Per Patient (self), Priscilla Williams is a 52 year old female has requested a refill of       DOXEPIN.       Last Office Visit: 08/21/2022 with Eusebio Me  Last Physical Exam:      There are no preventive care reminders to display for this patient.    Other Med Adult:  Most Recent BP Reading(s)  08/21/22 : 123/84        Cholesterol (mg/dL)   Date Value   16/03/9603 235     LOW DENSITY LIPOPROTEIN DIRECT (mg/dL)   Date Value   54/02/8118 138     HIGH DENSITY LIPOPROTEIN (mg/dL)   Date Value   14/78/2956 89 (H)     TRIGLYCERIDES (mg/dL)   Date Value   21/30/8657 94         THYROID SCREEN TSH REFLEX FT4 (uIU/mL)   Date Value   03/26/2019 0.786         TSH (THYROID STIM HORMONE) (uIU/mL)   Date Value   07/31/2015 1.290       HEMOGLOBIN A1C (%)   Date Value   03/26/2019 5.0       No results found for: "POCA1C"      No results found for: "INR"    SODIUM (mmol/L)   Date Value   02/19/2021 138       POTASSIUM (mmol/L)   Date Value   02/19/2021 4.3           CREATININE (mg/dL)   Date Value   84/69/6295 0.7       Documented patient preferred pharmacies:    [facility] - [place], [place] - [address]  Phone: [phone] Fax: [phone]

## 2023-03-05 ENCOUNTER — Encounter (HOSPITAL_BASED_OUTPATIENT_CLINIC_OR_DEPARTMENT_OTHER): Payer: Self-pay | Admitting: Family Medicine

## 2023-03-05 NOTE — Telephone Encounter (Signed)
From: Philis Fendt  To: Office of Clerance Lav  Sent: 03/05/2023 12:25 AM EDT  Subject: Medication Renewal Request    Refills have been requested for the following medications:   Other - Wegovy 2.4     Preferred pharmacy: [facility] - [place], [place] - [address]

## 2023-03-05 NOTE — Telephone Encounter (Signed)
See patient message and advise. Thanks.

## 2023-03-05 NOTE — Telephone Encounter (Signed)
PER Patient (self), Priscilla Williams is a 52 year old female has requested a refill of WEGOVY.      Last OFFICE Visit:  08/21/2022 Sherril Cong A., PA-C     Last TELE Visit: Recent Visits  No visits were found meeting these conditions.  Showing recent visits within past 365 days with a meds authorizing provider and meeting all other requirements  Future Appointments  No visits were found meeting these conditions.  Showing future appointments within next 0 days with a meds authorizing provider and meeting all other requirements      Last Physical Exam: 03/14/2022     There are no preventive care reminders to display for this patient.    Other Med Adult:  Most Recent BP Reading(s)  08/21/22 : 123/84        Cholesterol (mg/dL)   Date Value   16/03/9603 235     LOW DENSITY LIPOPROTEIN DIRECT (mg/dL)   Date Value   54/02/8118 138     HIGH DENSITY LIPOPROTEIN (mg/dL)   Date Value   14/78/2956 89 (H)     TRIGLYCERIDES (mg/dL)   Date Value   21/30/8657 94         THYROID SCREEN TSH REFLEX FT4 (uIU/mL)   Date Value   03/26/2019 0.786         TSH (THYROID STIM HORMONE) (uIU/mL)   Date Value   07/31/2015 1.290       HEMOGLOBIN A1C (%)   Date Value   03/26/2019 5.0       No results found for: "POCA1C"      No results found for: "INR"    SODIUM (mmol/L)   Date Value   02/19/2021 138       POTASSIUM (mmol/L)   Date Value   02/19/2021 4.3           CREATININE (mg/dL)   Date Value   84/69/6295 0.7       Documented patient preferred pharmacies:    [facility] - [place], [place] - [address]  Phone: [phone] Fax: [phone]

## 2023-03-07 MED ORDER — SEMAGLUTIDE-WEIGHT MANAGEMENT 2.4 MG/0.75ML SC SOAJ
2.4000 mg | SUBCUTANEOUS | 5 refills | Status: DC
Start: 2023-03-07 — End: 2023-03-10

## 2023-03-10 ENCOUNTER — Encounter (HOSPITAL_BASED_OUTPATIENT_CLINIC_OR_DEPARTMENT_OTHER): Payer: Self-pay | Admitting: Family Medicine

## 2023-03-10 MED ORDER — SEMAGLUTIDE-WEIGHT MANAGEMENT 2.4 MG/0.75ML SC SOAJ
2.4000 mg | SUBCUTANEOUS | 5 refills | Status: DC
Start: 2023-03-10 — End: 2023-09-08

## 2023-03-10 NOTE — Telephone Encounter (Signed)
Priscilla Patient (self), Priscilla Williams is a 52 year old female has requested a refill of wegovy.      Last OFFICE Visit:  08/21/2022 Sherril Cong A., PA-C     Last TELE Visit: Recent Visits  No visits were found meeting these conditions.  Showing recent visits within past 365 days with a meds authorizing provider and meeting all other requirements  Future Appointments  No visits were found meeting these conditions.  Showing future appointments within next 0 days with a meds authorizing provider and meeting all other requirements      Last Physical Exam: 03/14/2022     There are no preventive care reminders to display for this patient.    Other Med Adult:  Most Recent BP Reading(s)  08/21/22 : 123/84        Cholesterol (mg/dL)   Date Value   16/03/9603 235     LOW DENSITY LIPOPROTEIN DIRECT (mg/dL)   Date Value   54/02/8118 138     HIGH DENSITY LIPOPROTEIN (mg/dL)   Date Value   14/78/2956 89 (H)     TRIGLYCERIDES (mg/dL)   Date Value   21/30/8657 94         THYROID SCREEN TSH REFLEX FT4 (uIU/mL)   Date Value   03/26/2019 0.786         TSH (THYROID STIM HORMONE) (uIU/mL)   Date Value   07/31/2015 1.290       HEMOGLOBIN A1C (%)   Date Value   03/26/2019 5.0       No results found for: "POCA1C"      No results found for: "INR"    SODIUM (mmol/L)   Date Value   02/19/2021 138       POTASSIUM (mmol/L)   Date Value   02/19/2021 4.3           CREATININE (mg/dL)   Date Value   84/69/6295 0.7       Documented patient preferred pharmacies:    [facility] - [place], [place] - [address]  Phone: [phone] Fax: [phone]

## 2023-03-18 ENCOUNTER — Encounter (HOSPITAL_BASED_OUTPATIENT_CLINIC_OR_DEPARTMENT_OTHER): Payer: Self-pay | Admitting: Family Medicine

## 2023-03-18 DIAGNOSIS — E559 Vitamin D deficiency, unspecified: Secondary | ICD-10-CM

## 2023-03-19 ENCOUNTER — Other Ambulatory Visit (HOSPITAL_BASED_OUTPATIENT_CLINIC_OR_DEPARTMENT_OTHER): Payer: Self-pay | Admitting: Family Medicine

## 2023-03-19 DIAGNOSIS — E559 Vitamin D deficiency, unspecified: Secondary | ICD-10-CM

## 2023-03-19 NOTE — Telephone Encounter (Signed)
PER Pharmacy, Priscilla Williams is a 52 year old female has requested a refill of vitamin d.      Last Office Visit: 08/21/2022 Dory Larsen., PA-C     Last Physical Exam: 03/14/2022    There are no preventive care reminders to display for this patient.    Other Med Adult:  Most Recent BP Reading(s)  08/21/22 : 123/84        Cholesterol (mg/dL)   Date Value   16/03/9603 235     LOW DENSITY LIPOPROTEIN DIRECT (mg/dL)   Date Value   54/02/8118 138     HIGH DENSITY LIPOPROTEIN (mg/dL)   Date Value   14/78/2956 89 (H)     TRIGLYCERIDES (mg/dL)   Date Value   21/30/8657 94         THYROID SCREEN TSH REFLEX FT4 (uIU/mL)   Date Value   03/26/2019 0.786         TSH (THYROID STIM HORMONE) (uIU/mL)   Date Value   07/31/2015 1.290       HEMOGLOBIN A1C (%)   Date Value   03/26/2019 5.0       No results found for: "POCA1C"      No results found for: "INR"    SODIUM (mmol/L)   Date Value   02/19/2021 138       POTASSIUM (mmol/L)   Date Value   02/19/2021 4.3           CREATININE (mg/dL)   Date Value   84/69/6295 0.7       Documented patient preferred pharmacies:      [id number] - [place], [place] - [address]  Phone: [phone] Fax: [phone]

## 2023-04-02 ENCOUNTER — Ambulatory Visit (HOSPITAL_BASED_OUTPATIENT_CLINIC_OR_DEPARTMENT_OTHER): Payer: BC Managed Care – HMO | Admitting: Cardiology

## 2023-04-02 ENCOUNTER — Ambulatory Visit (HOSPITAL_BASED_OUTPATIENT_CLINIC_OR_DEPARTMENT_OTHER): Payer: BC Managed Care – HMO | Admitting: Cardiovascular Disease

## 2023-04-11 ENCOUNTER — Encounter (HOSPITAL_BASED_OUTPATIENT_CLINIC_OR_DEPARTMENT_OTHER): Payer: Self-pay | Admitting: Family Medicine

## 2023-04-11 DIAGNOSIS — I10 Essential (primary) hypertension: Secondary | ICD-10-CM

## 2023-04-12 ENCOUNTER — Other Ambulatory Visit (HOSPITAL_BASED_OUTPATIENT_CLINIC_OR_DEPARTMENT_OTHER): Payer: Self-pay | Admitting: Family Medicine

## 2023-04-12 DIAGNOSIS — I10 Essential (primary) hypertension: Secondary | ICD-10-CM

## 2023-04-12 MED ORDER — LISINOPRIL 20 MG PO TABS
20.0000 mg | ORAL_TABLET | Freq: Every day | ORAL | 0 refills | Status: DC
Start: 2023-04-12 — End: 2023-07-12

## 2023-04-12 NOTE — Telephone Encounter (Signed)
PER Pharmacy, Priscilla Williams is a 52 year old female has requested a refill of lisinoril.    This refill request failed protocol because:     Normal serum potassium in past 12 months    eGFR > 55 in past year       Patient needs an updated lab appt. Per protocol orders, sending 90 day 0 refills. Front desk to schedule appt. Thank you!         Last Office Visit: 08/01/22 with Eusebio Me   Last Physical Exam: 03/14/22    There are no preventive care reminders to display for this patient.    Other Med Adult:  Most Recent BP Reading(s)  08/21/22 : 123/84        Cholesterol (mg/dL)   Date Value   16/03/9603 235     LOW DENSITY LIPOPROTEIN DIRECT (mg/dL)   Date Value   54/02/8118 138     HIGH DENSITY LIPOPROTEIN (mg/dL)   Date Value   14/78/2956 89 (H)     TRIGLYCERIDES (mg/dL)   Date Value   21/30/8657 94         THYROID SCREEN TSH REFLEX FT4 (uIU/mL)   Date Value   03/26/2019 0.786         TSH (THYROID STIM HORMONE) (uIU/mL)   Date Value   07/31/2015 1.290       HEMOGLOBIN A1C (%)   Date Value   03/26/2019 5.0       No results found for: "POCA1C"      No results found for: "INR"    SODIUM (mmol/L)   Date Value   02/19/2021 138       POTASSIUM (mmol/L)   Date Value   02/19/2021 4.3           CREATININE (mg/dL)   Date Value   84/69/6295 0.7       Documented patient preferred pharmacies:    [facility] - [place], [place] - [address]  Phone: [phone] Fax: [phone]

## 2023-05-09 ENCOUNTER — Telehealth (HOSPITAL_BASED_OUTPATIENT_CLINIC_OR_DEPARTMENT_OTHER): Payer: Self-pay | Admitting: Family Medicine

## 2023-05-09 ENCOUNTER — Encounter (HOSPITAL_BASED_OUTPATIENT_CLINIC_OR_DEPARTMENT_OTHER): Payer: Self-pay | Admitting: Family Medicine

## 2023-05-09 NOTE — Telephone Encounter (Signed)
Prior Authorization needed  Received: Today  [name] Centralized Refill Pool  Phone Number: [phone]     Good Morning  I went to refill my prescription for [facility] and I was told that it can't be refilled and needs a prior authorization, the pharmacy was sending the request over and it could take a few days for this to be refilled. I am asking if this process can be expedited and completed as soon as possible. I will be missing a dose this weekend and in the past when this was needed I have missed a few doses as the pharmacy doesn't receive the information for a couple weeks and unfortunately I end up missing a couple doses. They also were not sure who exactly to send it to as there were several doctors listed, since I had so much turn around, it was sent to [facility] in general I believe this time is there a way someone could reach out to the pharmacy for this. I appreciate your help with this matter          Prior Authorization needed  (Newest Message First)  View All Conversations on this Encounter  Priscilla Williams  P Centralized Refill Pool (supporting Clerance Lav, MD)7 hours ago (9:47 AM)     LW  Good Morning   I went to refill my prescription for [facility] and I was told that it can't be refilled and needs a prior authorization, the pharmacy was sending the request over and it could take a few days for this to be refilled. I am asking if this process can be expedited and completed as soon as possible. I will be missing a dose this weekend and in the past when this was needed I have missed a few doses as the pharmacy doesn't receive the information for a couple weeks and unfortunately I end up missing a couple doses. They also were not sure who exactly to send it to as there were several doctors listed, since I had so much turn around, it was sent to [facility] in general I believe this time is there a way someone could reach out to the pharmacy for this. I appreciate your help with this matter

## 2023-05-12 ENCOUNTER — Encounter (HOSPITAL_BASED_OUTPATIENT_CLINIC_OR_DEPARTMENT_OTHER): Payer: Self-pay | Admitting: Family Medicine

## 2023-05-12 NOTE — Telephone Encounter (Signed)
Pa has been created in a different encounter

## 2023-05-12 NOTE — Prior Authorization (Signed)
Patient Insurance: paid 340b  Member ID: [id number]  BIN: [id number]  Group: [id number]  Prior Authorization for: (WEGOVY) 2.4 MG/0.75ML sc

## 2023-05-12 NOTE — Prior Authorization (Signed)
Hello,     For continuation therapy we need patient's weight and BMI documented and dated within 90 days     Please review and provide

## 2023-05-13 NOTE — Prior Authorization (Signed)
Hello,    This is not a refill request     This is a pa request and we can not initiate until we have    patient's weight and BMI documented and dated within 90 days     Please notify CR when its done     Thank you!

## 2023-05-14 ENCOUNTER — Encounter (HOSPITAL_BASED_OUTPATIENT_CLINIC_OR_DEPARTMENT_OTHER): Payer: Self-pay | Admitting: Family Medicine

## 2023-05-14 ENCOUNTER — Telehealth (HOSPITAL_BASED_OUTPATIENT_CLINIC_OR_DEPARTMENT_OTHER): Payer: Self-pay | Admitting: Family Medicine

## 2023-05-14 NOTE — Telephone Encounter (Signed)
Returned call to pt.  Left voicemail to please call back and also check her MyCHArt inbox as a message will be sent.

## 2023-05-14 NOTE — Telephone Encounter (Signed)
Priscilla Williams [id number], 52 year old, female    Calls today: Medication Questions  Patient Calling    What medication do you have questions about [name]  What is your question: why is it not being refilled already missed 1 dose  Return phone number [phone]    Person calling on behalf of patient: Patient (self)    CALL BACK NUMBER: [phone]  Best time to call back: asap  Cell phone:   Other phone:    Patient's language of care: English    Patient does not need an interpreter.    Patient's PCP: Clerance Lav, MD    Primary Care Home Site:  [facility]  Waiting for PA, but also the pharmacy stated they need a wt and BMI, but pt states she usually gives it over the phone.

## 2023-05-16 ENCOUNTER — Ambulatory Visit: Payer: BC Managed Care – HMO | Attending: Family Medicine | Admitting: Family Medicine

## 2023-05-16 ENCOUNTER — Other Ambulatory Visit: Payer: Self-pay

## 2023-05-16 VITALS — BP 123/83 | HR 69 | Temp 97.4°F | Ht 60.37 in | Wt 152.6 lb

## 2023-05-16 DIAGNOSIS — Z6833 Body mass index (BMI) 33.0-33.9, adult: Secondary | ICD-10-CM | POA: Insufficient documentation

## 2023-05-16 NOTE — Progress Notes (Signed)
Patient came to clinic for nurse visit for height and weight to complete PA for medication.     Patient with questions of appropriate weight for height and when to discontinue Wegovy.     Scheduled televisit for 07/27/2022 with Dr. Lalla Brothers.

## 2023-05-17 NOTE — Prior Authorization (Signed)
Temple Pacini, RN  P Centralized Refill Pool  Hi all!  Patient came in today for weight and height for PA for [facility]. Please review and complete PA.  Thanks!  Tresa Endo

## 2023-05-18 NOTE — Prior Authorization (Signed)
Phone:    Filled out Prior Authorization Request form for: wegovy  DX & ICD 10: obesity     Form does not require provider signature - already faxed form to insurance company for review.  Scanned form into Epic under Careers information officer - PA for: wegovy            Phone:    Filled out Prior Authorization Request form for: wegovy  DX & ICD 10: obesity     Form does not require provider signature - already faxed form to insurance company for review.  Scanned form into Epic under Careers information officer - PA for: [employer]

## 2023-05-26 NOTE — Prior Authorization (Signed)
Status:    Prior Authorization Status: Approved  Type: Pharmacy       Pharmacy:    PRIOR AUTHORIZATION has been approved for the following time frame: 4 months  Start Date: 05/18/23  End Date: 09/16/23  Spoke to: [facility]  Copay: 15     The patient and their preferred pharmacy are aware of the approval.  Approval notice has been scanned into Careers information officer.

## 2023-06-23 NOTE — Progress Notes (Signed)
 Cardiology Outpatient/Clinic Note  Date of Encounter:  06/24/2023      Language of Care: English      Clinical Question to be Addressed:  Fmhx of CVA, CAD. Patient with presyncope and palpitations.  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 Referral for further discussion of above and mgmt.       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 Merle Dennis who presents for management of Palpitations/presyncope.  Patient is referred by Nell Mora, MD.    Reports palpitations suddenly when she is standing. Fluttering feeling in the chest and had presyncope. Also happens when she is driving and in the middle of a conversation. No syncope. The palpitations started on in 2022. The palpitations have now been occurring more frequently to at least once a week and lasts a minutes. Apple watch shows HR 120 at times.     She is busy at work and has a treadmill at home that she rarely uses. She does have to climb 2 floor at home and denies any chest pain or SOB. She also tries to take the stairs at work.    She reports her weight was 227lbs in 2022 and lost weight now 153lbs and on wegovy  for a year and weight watchers prior to that.    SOCIAL HISTORY:  Tobacco:None  Alcohol:Rarely on celebrations.  Drugs: None  Living Situation: Lives with husband and kids (4 kids)  Career: Working as Environmental Health Practitioner.    FAMILY HISTORY of CARDIOVASCULAR DISEASE:  Mother: HTN, Esophageal cancer, lung cancer.   Grandmother paternal: 64 from MI.  Father: MI at age 73 and another MI later 51 years old and stents,Carotid artery disease, 2 CVAs.  Siblings: Half brother passed at 45 years old from massive heart attack.  Children: Healthy kids.    Active Problem List:  Patient Active  Problem List:     Migraine     Irritable bowel syndrome     BMI 37.0-37.9, adult     Nasal polyps     Osteoarthritis of cervical spine with myelopathy     Hepatic steatosis     Asymptomatic microscopic hematuria     Brain aneurysm     Essential hypertension     Maxillary sinus cyst     Lung nodule     RUQ abdominal pain     Family history of cardiac arrest     Family history of breast cancer     Family history of ovarian cancer     Cervical spondylosis with myelopathy     Occlusion of right carotid artery      MEDICATIONS:  lisinopril  (ZESTRIL ) 20 MG tablet, Take 1 tablet by mouth daily, Disp: 90 tablet, Rfl: 0  D-1000 EXTRA STRENGTH 25 MCG (1000 UT) tablet, TAKE 1 TABLET BY MOUTH EVERY DAY IN THE MORNING, Disp: 90 tablet, Rfl: 3  semaglutide -Weight Management (WEGOVY ) 2.4 MG/0.75ML sc auto-injector, Inject 2.4 mg under the skin once a week, Disp: 4 each, Rfl: 5  doxepin  (SINEQUAN ) 10 MG capsule, Take 1 capsule by mouth in the morning., Disp: 90 capsule, Rfl: 1  gabapentin (NEURONTIN) 100 MG capsule, Take 1 capsule by mouth every 8 (eight) hours (Patient taking differently: Take 1 capsule by mouth  every 8 (eight) hours as needed), Disp: , Rfl:     No current facility-administered medications on file prior to visit.      No orders of the defined types were placed in this encounter.      ALLERGIES:  Review of Patient's Allergies indicates:   Wasp venom protein      Swelling    Comment:Other reaction(s): SWELLING,NAUSEA,SOB      PHYSICAL EXAM:   06/24/23  1000   BP: 131/88   Site: Left Arm   Position: Sitting   Cuff Size: Regular   Pulse: 74   Temp: 97.1 F (36.2 C)   TempSrc: Temporal   SpO2: 100%   Weight: 69.4 kg (153 lb)     Most Recent BP Reading(s)  06/24/23 : 131/88  05/16/23 : 123/83  08/21/22 : 123/84  03/14/22 : 128/85  02/19/21 : 135/83      Most Recent Pulse Reading(s)  06/24/23 : 74  05/16/23 : 69  08/21/22 : 77  03/14/22 : 78  02/19/21 : 85          Most Recent Weight Reading(s)  06/24/23 : 69.4 kg (153  lb)  05/16/23 : 69.2 kg (152 lb 9.6 oz)  08/21/22 : 78.5 kg (173 lb)  03/14/22 : 87.5 kg (193 lb)  02/19/21 : 101 kg (222 lb 9.6 oz)       General/Constitutional: Vital signs reviewed and as per above.  Patient appears stated age.  Not in acute distress.  Cardiovascular: Normal Rate, Regular Rhythm.  No murmurs, rubs, gallops.  Normal S1, S2.  No S3, S4.  PMI nondisplaced.  No varicose veins.  No leg edema/erythema.  2+ radial and pt pulses bilaterally.  No carotid bruits/thrills.  Respiratory: Clear to auscultation anteriorly/posteriorly without wheezes, crackles, rhonchi, stridor.  Good air movement.  Skin: Warm, dry.    STUDIES:  LABS:   Glucose Random (mg/dL)   Date Value   87/68/7975 89     BUN (UREA NITROGEN) (mg/dL)   Date Value   87/68/7975 13     CALCIUM  (mg/dL)   Date Value   87/68/7975 10.4     CHLORIDE (mmol/L)   Date Value   06/24/2023 99     CARBON DIOXIDE (mmol/L)   Date Value   06/24/2023 28     ANION GAP (mmol/L)   Date Value   06/24/2023 12     CREATININE (mg/dL)   Date Value   87/68/7975 0.7     POTASSIUM (mmol/L)   Date Value   06/24/2023 4.7     SODIUM (mmol/L)   Date Value   06/24/2023 139     ESTIMATED GLOMERULAR FILT RATE (ML/MIN)   Date Value   06/24/2023 > 60               Estimated Creatinine Clearance: 83 mL/min (based on SCr of 0.7 mg/dL).    Recent Lab Values    No lab values to display.       Cholesterol (mg/dL)   Date Value   87/68/7975 228   02/19/2021 235   03/26/2019 207     LOW DENSITY LIPOPROTEIN DIRECT (mg/dL)   Date Value   87/68/7975 130   02/19/2021 138   03/26/2019 110     HIGH DENSITY LIPOPROTEIN (mg/dL)   Date Value   87/68/7975 95   02/19/2021 89 (H)   03/26/2019 75     TRIGLYCERIDES (mg/dL)   Date Value   87/68/7975 68   02/19/2021  94   03/26/2019 83       ALBUMIN (g/dL)   Date Value   87/68/7975 4.9     ALKALINE PHOSPHATASE (U/L)   Date Value   06/24/2023 130 (H)     ALANINE AMINOTRANSFERASE (U/L)   Date Value   06/24/2023 35     ASPARTATE AMINOTRANSFERASE (U/L)   Date  Value   06/24/2023 31     BILIRUBIN DIRECT (mg/dl)   Date Value   89/78/7994 0.1     BILIRUBIN TOTAL (mg/dL)   Date Value   87/68/7975 0.5     TOTAL PROTEIN (g/dL)   Date Value   87/68/7975 7.4        HEMOGLOBIN A1C (%)   Date Value   06/24/2023 4.9   03/26/2019 5.0   07/23/2016 5.2       No results found for: POCA1C    TSH (THYROID  STIM HORMONE) (uIU/mL)   Date Value   07/31/2015 1.290         EKG:   On my personal review, today's EKG: 12/31/2024L NSR  Prior EKGs (on my review):      ECHO:   NA    STRESS TESTING:   NA    CARDIAC CATHETERIZATION:  NA    CARDIAC-RELEVANT CT/MRI/US :  NA    HOLTER/EVENT MONITORING:  NA      ASSESSMENT/PLAN:    Mild Carotid disease  Family hx of premature CAD  The 10-year ASCVD risk score (Arnett DK, et al., 2019) is: 1.2%  ASCVD Risk Enhancers: Family history of premature ASCVD  - Will order risk enhancers including LpA, Apo B and HS CRP, Lipid panel and HbA1c and labs ordered.  - Given family hx and mild carotid disease would target LDL <116.    Palpitations  Presyncope  Occurring more frequently and occurs randomly associated with presyncope at times. Given random events will further workup for arrhythmias.   - TSH ordered.  - Event monitor ordered  - Echocardiogram ordered to assess for structural heart disease.    Overweight  She reports her weight was 227lbs in 2022 and lost weight now 153lbs and on wegovy  for a year and weight watchers prior to that.  - She is doing very well and continues to try to loose weight.    HTN  BP 131/88  - Monitor at home.  - Target <130/80. DASH diet instructions given.  - Continue Lisinopril  20mg  daily. May need uptitration or addition of chlorthalidone 25mg  daily.    FOLLOW-UP:  Return for follow up with me in the Cardiology Clinic in 3 month(s).  The patient may schedule an earlier appointment to discuss heart-related issues, as  needed.    _____________________________________________________________________________________________________________________________________________  I spent a total of 60 minutes on this visit on the date of service (total time includes all activities performed on the date of service such as chart review, time speaking with the patient, documenting and communicating with other providers)   Thank you for the privilege of involving me in this patient's care.  Please feel free to contact me if you have any questions.  This patient encounter note was created using voice-recognition software and in real time. Please excuse any typographical errors that have not been edited out.     Electronically signed by:   Dell Batters, MD  [facility] Cardiology  06/24/2023 3:12 PM

## 2023-06-24 ENCOUNTER — Other Ambulatory Visit: Payer: Self-pay

## 2023-06-24 ENCOUNTER — Ambulatory Visit (HOSPITAL_BASED_OUTPATIENT_CLINIC_OR_DEPARTMENT_OTHER): Payer: BC Managed Care – HMO | Admitting: Cardiology

## 2023-06-24 ENCOUNTER — Ambulatory Visit
Admission: RE | Admit: 2023-06-24 | Discharge: 2023-06-24 | Disposition: A | Discharge status: Home / Self Care | Payer: BC Managed Care – HMO | Attending: Cardiology | Admitting: Cardiology

## 2023-06-24 VITALS — BP 131/88 | HR 74 | Temp 97.1°F | Wt 153.0 lb

## 2023-06-24 DIAGNOSIS — I10 Essential (primary) hypertension: Secondary | ICD-10-CM

## 2023-06-24 DIAGNOSIS — Z8249 Family history of ischemic heart disease and other diseases of the circulatory system: Secondary | ICD-10-CM | POA: Diagnosis present

## 2023-06-24 DIAGNOSIS — I6522 Occlusion and stenosis of left carotid artery: Secondary | ICD-10-CM

## 2023-06-24 DIAGNOSIS — I6521 Occlusion and stenosis of right carotid artery: Secondary | ICD-10-CM | POA: Diagnosis present

## 2023-06-24 DIAGNOSIS — R002 Palpitations: Secondary | ICD-10-CM

## 2023-06-24 DIAGNOSIS — R55 Syncope and collapse: Secondary | ICD-10-CM | POA: Diagnosis not present

## 2023-06-24 DIAGNOSIS — E663 Overweight: Secondary | ICD-10-CM

## 2023-06-24 LAB — COMPREHENSIVE METABOLIC PANEL
ALANINE AMINOTRANSFERASE: 35 U/L (ref 12–45)
ALBUMIN: 4.9 g/dL (ref 3.4–5.2)
ALKALINE PHOSPHATASE: 130 U/L — ABNORMAL HIGH (ref 45–117)
ANION GAP: 12 mmol/L (ref 10–22)
ASPARTATE AMINOTRANSFERASE: 31 U/L (ref 8–34)
BILIRUBIN TOTAL: 0.5 mg/dL (ref 0.2–1.0)
BUN (UREA NITROGEN): 13 mg/dL (ref 7–18)
CALCIUM: 10.4 mg/dL (ref 8.5–10.5)
CARBON DIOXIDE: 28 mmol/L (ref 21–32)
CHLORIDE: 99 mmol/L (ref 98–107)
CREATININE: 0.7 mg/dL (ref 0.4–1.2)
ESTIMATED GLOMERULAR FILT RATE: >60 mL/min (ref >60)
Glucose Random: 89 mg/dL (ref 74–160)
POTASSIUM: 4.7 mmol/L (ref 3.5–5.1)
SODIUM: 139 mmol/L (ref 136–145)
TOTAL PROTEIN: 7.4 g/dL (ref 6.4–8.2)

## 2023-06-24 LAB — LIPID PANEL
Cholesterol: 228 mg/dL (ref 0–239)
HIGH DENSITY LIPOPROTEIN: 95 mg/dL (ref 40–60)
LOW DENSITY LIPOPROTEIN DIRECT: 130 mg/dL (ref 0–189)
TRIGLYCERIDES: 68 mg/dL (ref 0–150)

## 2023-06-24 LAB — C-REACTIVE PROTEIN HIGH SENS: C-REACTIVE PROTEIN HIGH SENS: 0 mg/L (ref 0–3)

## 2023-06-24 LAB — THYROID SCREEN TSH REFLEX FT4: THYROID SCREEN TSH REFLEX FT4: 1.44 u[IU]/mL (ref 0.270–4.200)

## 2023-06-24 LAB — HEMOGLOBIN A1C
ESTIMATED AVERAGE GLUCOSE: 94 mg/dL (ref 74–160)
HEMOGLOBIN A1C: 4.9 % (ref 4.0–5.6)

## 2023-06-24 NOTE — Patient Instructions (Addendum)
 Blood pressure target <130/80.  Blood tests today.  Schedule echocardiogram.  Schedule event monitor.  Recommend a healthy diet: small portions, reduced intake of starchy foods (e.g. potatoes), elimination of white breads and pastas, and elimination of sugar-sweetened beverages.  Recommend (based on AHA recommendations) at least 150 minutes of moderate-intensity aerobic activity or 75 minutes of vigorous aerobic activity per week.  Include moderate- or high-intensity muscle building activity at least 2 days per week.  Ideally, perform 5 hours of activity per week.  Start slow and increase gradually.

## 2023-06-26 LAB — LIPOPROTEIN A: LIPOPROTEIN A: 33.7 nmol/L (ref <75.0)

## 2023-06-26 LAB — APOLIPOPROTEIN B: APOLIPOPROTEIN B: 85 mg/dL (ref <90)

## 2023-06-30 LAB — EKG

## 2023-07-08 ENCOUNTER — Other Ambulatory Visit: Payer: Self-pay

## 2023-07-08 ENCOUNTER — Ambulatory Visit
Admission: RE | Admit: 2023-07-08 | Discharge: 2023-07-08 | Disposition: A | Discharge status: Home / Self Care | Payer: BC Managed Care – HMO | Attending: Cardiology | Admitting: Cardiology

## 2023-07-08 DIAGNOSIS — R002 Palpitations: Secondary | ICD-10-CM | POA: Insufficient documentation

## 2023-07-12 ENCOUNTER — Encounter (HOSPITAL_BASED_OUTPATIENT_CLINIC_OR_DEPARTMENT_OTHER): Payer: Self-pay | Admitting: Family Medicine

## 2023-07-12 DIAGNOSIS — I10 Essential (primary) hypertension: Secondary | ICD-10-CM

## 2023-07-12 MED ORDER — LISINOPRIL 20 MG PO TABS: 20.0000 mg | ORAL_TABLET | Freq: Every day | ORAL | 3 refills | Status: DC

## 2023-07-12 NOTE — Telephone Encounter (Signed)
This prescription refill request has passed the Rx Renewal Authorization protocol.  This medication has been approved and sent to the patient's preferred pharmacy.      PER Patient (self), Priscilla Williams is a 53 year old female has requested a refill of lisinopril.      Last Office Visit: 05/16/23 with Drema Balzarine   Last Physical Exam: 03/14/22    There are no preventive care reminders to display for this patient.    Other Med Adult:  Most Recent BP Reading(s)  06/24/23 : 131/88        Cholesterol (mg/dL)   Date Value   96/29/5284 228     LOW DENSITY LIPOPROTEIN DIRECT (mg/dL)   Date Value   13/24/4010 130     HIGH DENSITY LIPOPROTEIN (mg/dL)   Date Value   27/25/3664 95     TRIGLYCERIDES (mg/dL)   Date Value   40/34/7425 68         THYROID SCREEN TSH REFLEX FT4 (uIU/mL)   Date Value   06/24/2023 1.440         TSH (THYROID STIM HORMONE) (uIU/mL)   Date Value   07/31/2015 1.290       HEMOGLOBIN A1C (%)   Date Value   06/24/2023 4.9       No results found for: "POCA1C"      No results found for: "INR"    SODIUM (mmol/L)   Date Value   06/24/2023 139       POTASSIUM (mmol/L)   Date Value   06/24/2023 4.7           CREATININE (mg/dL)   Date Value   95/63/8756 0.7       Documented patient preferred pharmacies:    [facility] - [place], [place] - [address]  Phone: [phone] Fax: [phone]

## 2023-07-17 DIAGNOSIS — E66811 Obesity, class 1: Secondary | ICD-10-CM | POA: Insufficient documentation

## 2023-07-17 DIAGNOSIS — Z8249 Family history of ischemic heart disease and other diseases of the circulatory system: Secondary | ICD-10-CM | POA: Insufficient documentation

## 2023-07-17 DIAGNOSIS — I10 Essential (primary) hypertension: Secondary | ICD-10-CM | POA: Insufficient documentation

## 2023-07-17 DIAGNOSIS — E663 Overweight: Secondary | ICD-10-CM | POA: Insufficient documentation

## 2023-07-17 DIAGNOSIS — E6609 Other obesity due to excess calories: Secondary | ICD-10-CM | POA: Insufficient documentation

## 2023-07-22 MED ORDER — ROSUVASTATIN CALCIUM 10 MG PO TABS: 10.0000 mg | ORAL_TABLET | Freq: Every day | ORAL | 3 refills | Status: DC

## 2023-07-22 NOTE — Addendum Note (Signed)
Addended by: Osvaldo Human on: 07/22/2023 01:57 PM     Modules accepted: Orders

## 2023-07-28 ENCOUNTER — Ambulatory Visit: Payer: BC Managed Care – HMO | Admitting: Family Medicine

## 2023-07-28 DIAGNOSIS — Z5321 Procedure and treatment not carried out due to patient leaving prior to being seen by health care provider: Secondary | ICD-10-CM

## 2023-07-28 NOTE — Progress Notes (Signed)
Patient left without being seen due to provider arriving in televisit approximately after appt time  Called x 2 and left messages that I would call back   Left 3rd message to reschedule

## 2023-08-16 ENCOUNTER — Encounter (HOSPITAL_BASED_OUTPATIENT_CLINIC_OR_DEPARTMENT_OTHER): Payer: Self-pay | Admitting: Family Medicine

## 2023-08-16 DIAGNOSIS — K580 Irritable bowel syndrome with diarrhea: Secondary | ICD-10-CM

## 2023-08-17 NOTE — Telephone Encounter (Signed)
 Note: Per MyCHArt message, patient is out of medication and will be seen next on 09/08/23.   Marland Kitchen    PER Patient (self), Priscilla Williams is a 53 year old female has requested a refill of doxepin.      Last Office Visit:  08/21/22 with Eusebio Me  Last Physical Exam: 03/14/22    There are no preventive care reminders to display for this patient.    Other Med Adult:  Most Recent BP Reading(s)  06/24/23 : 131/88        Cholesterol (mg/dL)   Date Value   16/03/9603 228     LOW DENSITY LIPOPROTEIN DIRECT (mg/dL)   Date Value   54/02/8118 130     HIGH DENSITY LIPOPROTEIN (mg/dL)   Date Value   14/78/2956 95     TRIGLYCERIDES (mg/dL)   Date Value   21/30/8657 68         THYROID SCREEN TSH REFLEX FT4 (uIU/mL)   Date Value   06/24/2023 1.440         TSH (THYROID STIM HORMONE) (uIU/mL)   Date Value   07/31/2015 1.290       HEMOGLOBIN A1C (%)   Date Value   06/24/2023 4.9       No results found for: "POCA1C"      No results found for: "INR"    SODIUM (mmol/L)   Date Value   06/24/2023 139       POTASSIUM (mmol/L)   Date Value   06/24/2023 4.7           CREATININE (mg/dL)   Date Value   84/69/6295 0.7       Documented patient preferred pharmacies:    [facility] - [place], [place] - [address]  Phone: [phone] Fax: [phone]

## 2023-08-18 MED ORDER — DOXEPIN HCL 10 MG PO CAPS
10.0000 mg | ORAL_CAPSULE | Freq: Every day | ORAL | 0 refills | Status: DC
Start: 2023-08-18 — End: 2023-11-20

## 2023-09-08 ENCOUNTER — Ambulatory Visit: Payer: BC Managed Care – HMO | Attending: Family Medicine | Admitting: Family Medicine

## 2023-09-08 ENCOUNTER — Encounter (HOSPITAL_BASED_OUTPATIENT_CLINIC_OR_DEPARTMENT_OTHER): Payer: Self-pay | Admitting: Family Medicine

## 2023-09-08 DIAGNOSIS — E663 Overweight: Secondary | ICD-10-CM | POA: Diagnosis not present

## 2023-09-08 DIAGNOSIS — I671 Cerebral aneurysm, nonruptured: Secondary | ICD-10-CM | POA: Insufficient documentation

## 2023-09-08 DIAGNOSIS — R911 Solitary pulmonary nodule: Secondary | ICD-10-CM | POA: Insufficient documentation

## 2023-09-08 DIAGNOSIS — Z8639 Personal history of other endocrine, nutritional and metabolic disease: Secondary | ICD-10-CM | POA: Diagnosis not present

## 2023-09-08 DIAGNOSIS — K582 Mixed irritable bowel syndrome: Secondary | ICD-10-CM | POA: Insufficient documentation

## 2023-09-08 MED ORDER — SEMAGLUTIDE-WEIGHT MANAGEMENT 2.4 MG/0.75ML SC SOAJ
2.4000 mg | SUBCUTANEOUS | 5 refills | Status: DC
Start: 2023-09-08 — End: 2024-02-11

## 2023-09-08 NOTE — Progress Notes (Signed)
 [facility] FAMILY MEDICINE  Telehealth Visit Note   Subjective:   CC: Priscilla Williams is a 53 year old female patient who presents today for telehealth visit for Patient presents with:  Prescription      Phone number: [phone]     #Obesity  On Wegovy 2.4mg   Started 2023  Pt was 193lbs in Sept 2023  Side effects  Stable around 150lbs (approximately 40lb- 20% weight loss)  Been on weight watchers- prioritizing fiber, protein and some swaps  Exercise- taking stairs at work, extended walk around block  Plan to increase walking   Water - working on drinking    #IBS  Sometimes constipation and sometimes loose stools  No worse with GLP-1  Taking Doxepin - taking it before bed      SH/PMH:  Brain aneurysm - incidental finding in 2019    Last CTA head in 04/2021 at [facility]  1. No acute intracranial abnormality.   2. Stable bulbous contour of the left MCA M1/M2 bifurcation measuring   approximately 4 mm in diameter and 3 mm in height, possibly a wide neck   aneurysm.  No new aneurysm.   3. Atherosclerosis of the right ICA ophthalmic segment with less than 50%   stenosis.  Otherwise, patent circle of Willis without evidence of stenosis or   occlusion.   4. Patent bilateral cervical carotid and vertebral arteries without evidence   of stenosis, occlusion, or dissection.   5. Decreased size of the bilateral maxillary mucous retention cyst since 03 March 2020.   6. Stable 6 mm ground-glass nodule in the left upper lobe.     ROS:  Per HPI     doxepin (SINEQUAN) 10 MG capsule, Take 1 capsule by mouth daily, Disp: 90 capsule, Rfl: 0  rosuvastatin (CRESTOR) 10 MG tablet, Take 1 tablet by mouth daily, Disp: 90 tablet, Rfl: 3  lisinopril (ZESTRIL) 20 MG tablet, Take 1 tablet by mouth daily, Disp: 90 tablet, Rfl: 3  D-1000 EXTRA STRENGTH 25 MCG (1000 UT) tablet, TAKE 1 TABLET BY MOUTH EVERY DAY IN THE MORNING, Disp: 90 tablet, Rfl: 3  [DISCONTINUED] semaglutide-Weight Management (WEGOVY) 2.4 MG/0.75ML sc auto-injector, Inject 2.4 mg  under the skin once a week, Disp: 4 each, Rfl: 5  gabapentin (NEURONTIN) 100 MG capsule, Take 1 capsule by mouth every 8 (eight) hours (Patient taking differently: Take 1 capsule by mouth every 8 (eight) hours as needed), Disp: , Rfl:     No current facility-administered medications on file prior to visit.    Objective:     Exam - vitals deferred for televisit  Speaking in complete sentences, breathing comfortably  Able to concentrate throughout visit    Assessment & Plan:     Priscilla Williams was seen today for prescription.    Diagnoses and all orders for this visit:    Lung nodule  Last CT in 2022  Due for follow up now. Further imaging dependent on results  CT ordered and pt sent number to schedule  -     CT LUNG NODULE FU WO CONTRAST; Future    Brain aneurysm  Stable 2019-2022  Per brief review untreated brain aneurysms once stable for 3 years  can be monitored every 2-5 years  Messaged patient post visit if she had a plan in place with neurology    Irritable bowel syndrome with both constipation and diarrhea  On Doxepin  Has been working well  Discussed OK to decrease to every other day since 1/2  life of 15 hours and see how symptoms go    Overweight (BMI 25.0-29.9)  History of obesity  Currently overweight but previously Obesity- has lost 20% of weight on wegovy  No current contraindications known  Pt weight is stable. Discussed increase in exercise, continue weight watchers, increase water intake  If still stable, can consider change to different GLP-1 or trial lower dose to see if able to continue to maintain.   -     semaglutide-Weight Management (WEGOVY) 2.4 MG/0.75ML sc auto-injector; Inject 2.4 mg under the skin once a week    CPEX this year. Pt will call      We discussed the patient's current medications. The patient expressed understanding and no barriers to adherence were identified.  Clerance Lav, MD 09/08/2023

## 2023-09-10 ENCOUNTER — Telehealth (HOSPITAL_BASED_OUTPATIENT_CLINIC_OR_DEPARTMENT_OTHER): Payer: Self-pay | Admitting: Otolaryngology

## 2023-09-10 NOTE — Telephone Encounter (Signed)
 Left message appointment on 5/13 with Dr Ruffin Frederick, needs to be rescheduled.

## 2023-09-22 ENCOUNTER — Ambulatory Visit
Admission: RE | Admit: 2023-09-22 | Discharge: 2023-09-22 | Disposition: A | Discharge status: Home / Self Care | Payer: BC Managed Care – HMO | Source: Ambulatory Visit | Attending: Cardiology | Admitting: Cardiology

## 2023-09-22 ENCOUNTER — Other Ambulatory Visit: Payer: Self-pay

## 2023-09-22 ENCOUNTER — Other Ambulatory Visit (HOSPITAL_BASED_OUTPATIENT_CLINIC_OR_DEPARTMENT_OTHER): Payer: Self-pay | Admitting: Cardiology

## 2023-09-22 DIAGNOSIS — R55 Syncope and collapse: Secondary | ICD-10-CM

## 2023-09-22 DIAGNOSIS — E663 Overweight: Secondary | ICD-10-CM

## 2023-09-22 DIAGNOSIS — Z8249 Family history of ischemic heart disease and other diseases of the circulatory system: Secondary | ICD-10-CM

## 2023-09-22 DIAGNOSIS — R002 Palpitations: Secondary | ICD-10-CM

## 2023-09-22 DIAGNOSIS — I6522 Occlusion and stenosis of left carotid artery: Secondary | ICD-10-CM

## 2023-09-22 DIAGNOSIS — I10 Essential (primary) hypertension: Secondary | ICD-10-CM

## 2023-09-22 LAB — ECHOCARDIOGRAM W/ ULTRASOUND CONTRAST: LVEF: 60 %

## 2023-09-22 MED ORDER — SULFUR HEXAFLUORIDE MICROSPH 60.7-25 MG IJ SUSR
1.0000 mL | Freq: Once | INTRAMUSCULAR | Status: AC
Start: 2023-09-22 — End: 2023-09-22

## 2023-09-22 MED ORDER — SULFUR HEXAFLUORIDE MICROSPH 60.7-25 MG IJ SUSR
INTRAMUSCULAR | Status: DC
Start: 2023-09-22 — End: 2023-09-23
  Filled 2023-09-22: qty 5

## 2023-10-10 ENCOUNTER — Encounter (HOSPITAL_BASED_OUTPATIENT_CLINIC_OR_DEPARTMENT_OTHER): Payer: Self-pay | Admitting: Family Medicine

## 2023-10-10 NOTE — Prior Authorization (Signed)
 Patient Insurance: paid 340b  Member ID: [id number]  BIN: [id number]  PCN: [id number]  Group: [id number]  Prior Authorization for: wegovy           Phone:    Filled out Prior Authorization Request form for: wegovy   DX & ICD 10: E66     Form does not require provider signature - already faxed form to insurance company for review.  Scanned form into Epic under Careers information officer - [place] for: wegovy 

## 2023-10-17 NOTE — Prior Authorization (Signed)
 Status:    Prior Authorization Status: Approved  Type: Pharmacy       Pharmacy:    Start Date: 10/10/23  End Date: 03/11/24     The patient and their preferred pharmacy are aware of the approval.  Approval notice has been scanned into Careers information officer.

## 2023-10-28 ENCOUNTER — Ambulatory Visit: Payer: BC Managed Care – HMO | Attending: Cardiology | Admitting: Cardiology

## 2023-10-28 ENCOUNTER — Other Ambulatory Visit: Payer: Self-pay

## 2023-10-28 VITALS — BP 137/88 | HR 78 | Temp 97.3°F | Wt 155.0 lb

## 2023-10-28 DIAGNOSIS — R55 Syncope and collapse: Secondary | ICD-10-CM | POA: Insufficient documentation

## 2023-10-28 DIAGNOSIS — R002 Palpitations: Secondary | ICD-10-CM | POA: Diagnosis not present

## 2023-10-28 DIAGNOSIS — I10 Essential (primary) hypertension: Secondary | ICD-10-CM | POA: Diagnosis not present

## 2023-10-28 DIAGNOSIS — F419 Anxiety disorder, unspecified: Secondary | ICD-10-CM | POA: Diagnosis not present

## 2023-10-28 DIAGNOSIS — E663 Overweight: Secondary | ICD-10-CM | POA: Insufficient documentation

## 2023-10-28 DIAGNOSIS — I6522 Occlusion and stenosis of left carotid artery: Secondary | ICD-10-CM | POA: Diagnosis not present

## 2023-10-28 DIAGNOSIS — Z8249 Family history of ischemic heart disease and other diseases of the circulatory system: Secondary | ICD-10-CM | POA: Diagnosis not present

## 2023-10-28 MED ORDER — OTHER MEDICATION
1.0000 | Freq: Every day | 0 refills | Status: DC
Start: 2023-10-28 — End: 2023-11-24

## 2023-10-28 NOTE — Patient Instructions (Addendum)
-   During presyncopal episodes, the patient has been counseled to tense the lower extremity muscles.  - Maintain hydration of at least 3 L fluid per day  - Daily use of compression socks with at least 20 mmHg level of compression.  Would optimally wear tight leggings to get full leg and some abdominal binding.  - Daily activity.  Start with walking at least 15 min per day.  Optimally, exercise should include aerobic activity with a heart rate of 70-75% age-predicted max (~147 bpm).  To build aerobic conditioning, which will improve symptoms, start with recumbent bicycling or other semi-recumbent activities.  Add lower extremity resistance straining to reduce venous pooling.  If unable to independently adhere to an exercise regimen, would recommend guided exercise.  - Have a consistent sleep and wake time.  - Combat anxiety and related symptoms with counseling per primary care.

## 2023-10-28 NOTE — Progress Notes (Signed)
 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 Timmy Forbes who presents for management of Palpitations/presyncope. Priscilla Williams husband with her today.     She reports she has been more stressed due to her daughter getting married in a couple of weeks and her other daughter graduating this summer.  She reports she drinks around 3 cups of 38 ounce coffees a day.  She sleeps only around 5 hours due to need to go to the bathroom at night.  She drinks only 1 to 1.5 L of fluids a day.  She reports she is very active during the day.  She reports getting lightheaded at times even sitting down reports palpitations at baseline and has not changed since 2022 including her lightheadedness. She reports her weight was 227lbs in 2022 and lost weight now 155lbs and on wegovy  for a year and weight watchers prior to that.      SOCIAL HISTORY:  Tobacco:None  Alcohol:Rarely on celebrations.  Drugs: None  Living Situation: Lives with husband and kids (4 kids)  Career: Working as Environmental health practitioner.    FAMILY HISTORY of CARDIOVASCULAR DISEASE:  Mother: HTN, Esophageal cancer, lung cancer.   Grandmother paternal: 15 from MI.  Father: MI at age 82 and another MI later 52 years old and stents,Carotid artery disease, 2 CVAs.  Siblings: Half brother passed at 62 years old from massive heart attack.  Children: Healthy kids.    Active Problem List:  Patient Active Problem List:     Migraine     Irritable bowel syndrome     BMI 37.0-37.9, adult     Nasal polyps     Osteoarthritis of cervical spine with myelopathy     Hepatic steatosis     Asymptomatic microscopic hematuria     Brain aneurysm     Essential hypertension     Maxillary  sinus cyst     Lung nodule     RUQ abdominal pain     Family history of cardiac arrest     Family history of breast cancer     Family history of ovarian cancer     Cervical spondylosis with myelopathy     Occlusion of right carotid artery     HTN (hypertension), benign     Family history of premature CAD     Overweight (BMI 25.0-29.9)      MEDICATIONS:  semaglutide -Weight Management (WEGOVY ) 2.4 MG/0.75ML sc auto-injector, Inject 2.4 mg under the skin once a week, Disp: 4 each, Rfl: 5  doxepin  (SINEQUAN ) 10 MG capsule, Take 1 capsule by mouth daily, Disp: 90 capsule, Rfl: 0  rosuvastatin  (CRESTOR ) 10 MG tablet, Take 1 tablet by mouth daily, Disp: 90 tablet, Rfl: 3  lisinopril  (ZESTRIL ) 20 MG tablet, Take 1 tablet by mouth daily, Disp: 90 tablet, Rfl: 3  D-1000 EXTRA STRENGTH 25 MCG (1000 UT) tablet, TAKE 1 TABLET BY MOUTH EVERY DAY IN THE MORNING, Disp: 90 tablet, Rfl: 3  gabapentin (NEURONTIN) 100 MG capsule, Take 1 capsule by mouth every 8 (eight) hours (Patient taking differently: Take 1 capsule by mouth every 8 (eight) hours as needed), Disp: , Rfl:  No current facility-administered medications on file prior to visit.      Orders Placed This Encounter      OTHER MEDICATION      ALLERGIES:  Review of Patient's Allergies indicates:   Wasp venom protein      Swelling    Comment:Other reaction(s): SWELLING,NAUSEA,SOB      PHYSICAL EXAM:   10/28/23  1034   BP: 137/88   Site: Left Arm   Position: Sitting   Cuff Size: Large   Pulse: 78   Temp: 97.3 F (36.3 C)   TempSrc: Temporal   SpO2: 100%   Weight: 70.3 kg (155 lb)       Most Recent BP Reading(s)  10/28/23 : 137/88  06/24/23 : 131/88  05/16/23 : 123/83  08/21/22 : 123/84  03/14/22 : 128/85      Most Recent Pulse Reading(s)  10/28/23 : 78  06/24/23 : 74  05/16/23 : 69  08/21/22 : 77  03/14/22 : 78          Most Recent Weight Reading(s)  10/28/23 : 70.3 kg (155 lb)  06/24/23 : 69.4 kg (153 lb)  05/16/23 : 69.2 kg (152 lb 9.6 oz)  08/21/22 : 78.5 kg (173  lb)  03/14/22 : 87.5 kg (193 lb)       General/Constitutional: Vital signs reviewed and as per above.  Patient appears stated age.  Not in acute distress.  Cardiovascular: Normal Rate, Regular Rhythm.  No murmurs, rubs, gallops.  Normal S1, S2.  No S3, S4.  PMI nondisplaced.  No varicose veins.  No leg edema/erythema.  2+ radial and pt pulses bilaterally.  No carotid bruits/thrills.  Respiratory: Clear to auscultation anteriorly/posteriorly without wheezes, crackles, rhonchi, stridor.  Good air movement.  Skin: Warm, dry.    STUDIES:  LABS:   Glucose Random (mg/dL)   Date Value   09/81/1914 89     BUN (UREA NITROGEN) (mg/dL)   Date Value   78/29/5621 13     CALCIUM  (mg/dL)   Date Value   30/86/5784 10.4     CHLORIDE (mmol/L)   Date Value   06/24/2023 99     CARBON DIOXIDE (mmol/L)   Date Value   06/24/2023 28     ANION GAP (mmol/L)   Date Value   06/24/2023 12     CREATININE (mg/dL)   Date Value   69/62/9528 0.7     POTASSIUM (mmol/L)   Date Value   06/24/2023 4.7     SODIUM (mmol/L)   Date Value   06/24/2023 139     ESTIMATED GLOMERULAR FILT RATE (ML/MIN)   Date Value   06/24/2023 > 60               Estimated Creatinine Clearance: 84 mL/min (based on SCr of 0.7 mg/dL).    Recent Lab Values    No lab values to display.       Cholesterol (mg/dL)   Date Value   41/32/4401 228   02/19/2021 235   03/26/2019 207     LOW DENSITY LIPOPROTEIN DIRECT (mg/dL)   Date Value   02/72/5366 130   02/19/2021 138   03/26/2019 110     HIGH DENSITY LIPOPROTEIN (mg/dL)   Date Value   44/08/4740 95   02/19/2021 89 (H)   03/26/2019 75     TRIGLYCERIDES (mg/dL)   Date Value   59/56/3875 68   02/19/2021 94   03/26/2019 83       ALBUMIN (g/dL)  Date Value   06/24/2023 4.9     ALKALINE PHOSPHATASE (U/L)   Date Value   06/24/2023 130 (H)     ALANINE AMINOTRANSFERASE (U/L)   Date Value   06/24/2023 35     ASPARTATE AMINOTRANSFERASE (U/L)   Date Value   06/24/2023 31     BILIRUBIN DIRECT (mg/dl)   Date Value   16/03/9603 0.1     BILIRUBIN TOTAL  (mg/dL)   Date Value   54/02/8118 0.5     TOTAL PROTEIN (g/dL)   Date Value   14/78/2956 7.4        HEMOGLOBIN A1C (%)   Date Value   06/24/2023 4.9   03/26/2019 5.0   07/23/2016 5.2       No results found for: "POCA1C"    TSH (THYROID  STIM HORMONE) (uIU/mL)   Date Value   07/31/2015 1.290         EKG:   On my personal review, today's EKG:   Prior EKGs (on my review):  06/24/2023:  NSR    ECHO:   TTE 09/12/2023  Conclusions: (click link below for full report)  1. Left Ventricle: Global systolic function: EF is estimated visually at 60% .  2. Left Ventricle: Regional systolic function: Wall motion: There are no regional wall motion abnormalities.  3. Right Ventricle: Normal size and systolic function.  4. Tricuspid Valve: The RVSP is estimated at 23 mmHg which is normal.  5. No hemodynamically significant valvular abnormalities.  6. There is no prior study available in our system for comparison.    STRESS TESTING:   NA    CARDIAC CATHETERIZATION:  NA    CARDIAC-RELEVANT CT/MRI/US :  NA    HOLTER/EVENT MONITORING:  Event monitor 07/08/2023  Conclusion:  The patient was monitored for 13 days and 16 hours.  Average HR 84 bpm (Range 58 - 144 bpm).  Predominant underlying rhythm was Sinus Rhythm.  No Atrial Fibrillation.  No high-grade AV blocks.  PVC burden is <1.0%.  No nonsustained ventricular tachycardia or sustained ventricular tachycardia.  PAC burden was <1.0%.  No SVT.    There were 9 patient-triggered and diary events, almost all of which corresponded to Sinus Rhythm without ectopy.      ASSESSMENT/PLAN:    Mild Carotid disease  Family hx of premature CAD  The 10-year ASCVD risk score (Arnett DK, et al., 2019) is: 1.3%, LP (a) of 33.7, Apo B 85mg /dl.  ASCVD Risk Enhancers: Family history of premature ASCVD  - On rosuvastatin  10 mg daily.  She is tolerating it well without any symptoms.  LFTs after starting it were normal.   -Will obtain lipid panel on next visit.  Target LDL of less than  100.    Palpitations  Presyncope  TSH was within normal limits an event monitor and echo unremarkable.  Her symptoms are more likely related to anxiety and stress.  - Discussed cutting down on caffeine  to 1 small cup a day.  - Discussed fluid intake at least 2 to 3 L a day.  - Prescribed compression stockings to try while she is at work to see if they help with the presyncopal episodes.  - Further instructions given.    Overweight  She reports her weight was 227lbs in 2022 and lost weight now 155lbs and on wegovy  for a year and weight watchers prior to that.  - She is doing very well and continues to try to loose weight.    HTN  BP 137/88.  -  Monitor at home.  - Target <130/80.  - Discussed increasing her blood pressure medications but she wanted to hold off till the next visit given she is under more stress recently.  - Continue Lisinopril  20mg  daily. May need uptitration.    FOLLOW-UP:  Return for follow up with me in the Cardiology Clinic in 6 month(s).  The patient may schedule an earlier appointment to discuss heart-related issues, as needed.    _____________________________________________________________________________________________________________________________________________  I spent a total of 40 minutes on this visit on the date of service (total time includes all activities performed on the date of service such as chart review, time speaking with the patient, documenting and communicating with other providers)   Thank you for the privilege of involving me in this patient's care.  Please feel free to contact me if you have any questions.  This patient encounter note was created using voice-recognition software and in real time. Please excuse any typographical errors that have not been edited out.     Electronically signed by:   Grandville Lax, MD  [facility]  10/28/2023 6:39 PM

## 2023-11-04 ENCOUNTER — Ambulatory Visit: Admitting: Otolaryngology

## 2023-11-10 ENCOUNTER — Ambulatory Visit: Admitting: Otolaryngology

## 2023-11-11 ENCOUNTER — Telehealth (HOSPITAL_BASED_OUTPATIENT_CLINIC_OR_DEPARTMENT_OTHER): Payer: Self-pay | Admitting: Otolaryngology

## 2023-11-11 ENCOUNTER — Ambulatory Visit: Admitting: Otolaryngology

## 2023-11-11 NOTE — Telephone Encounter (Signed)
 Left message to reschedule appointment missed with Dr Rozella Cornfield on 5/19.

## 2023-11-13 ENCOUNTER — Telehealth (HOSPITAL_BASED_OUTPATIENT_CLINIC_OR_DEPARTMENT_OTHER): Payer: Self-pay | Admitting: Family Medicine

## 2023-11-13 DIAGNOSIS — R55 Syncope and collapse: Secondary | ICD-10-CM

## 2023-11-13 NOTE — Telephone Encounter (Signed)
 Hello,  [facility] received a request for Compression Stockings. Please note patient's insurance just covers the strength 30-40 mmHg     Please advise and if appropriate send RX for this strenght.    Please note for this order we will need the following information:    Compression Strength    30-40 mmHg    DX & ICD10   Length (knee or thigh)  Measurements/ circumference  For thigh length include foot, ankle, calf and thigh  For knee length include foot, ankle and calf       Thank you

## 2023-11-20 ENCOUNTER — Other Ambulatory Visit (HOSPITAL_BASED_OUTPATIENT_CLINIC_OR_DEPARTMENT_OTHER): Payer: Self-pay | Admitting: Family Medicine

## 2023-11-20 DIAGNOSIS — K580 Irritable bowel syndrome with diarrhea: Secondary | ICD-10-CM

## 2023-11-20 NOTE — Telephone Encounter (Signed)
 PER Pharmacy, Priscilla Williams is a 53 year old female has requested a refill of doxepin  .      Last OFFICE/TELE Visit:  [id number] with d'agata      Last Physical Exam:   03/14/2022     There are no preventive care reminders to display for this patient.    Other Med Adult:  Most Recent BP Reading(s)  10/28/23 : 137/88        Cholesterol (mg/dL)   Date Value   69/62/9528 228     LOW DENSITY LIPOPROTEIN DIRECT (mg/dL)   Date Value   41/32/4401 130     HIGH DENSITY LIPOPROTEIN (mg/dL)   Date Value   02/72/5366 95     TRIGLYCERIDES (mg/dL)   Date Value   44/08/4740 68         THYROID  SCREEN TSH REFLEX FT4 (uIU/mL)   Date Value   06/24/2023 1.440         TSH (THYROID  STIM HORMONE) (uIU/mL)   Date Value   07/31/2015 1.290       HEMOGLOBIN A1C (%)   Date Value   06/24/2023 4.9       No results found for: "POCA1C"      No results found for: "INR"    SODIUM (mmol/L)   Date Value   06/24/2023 139       POTASSIUM (mmol/L)   Date Value   06/24/2023 4.7           CREATININE (mg/dL)   Date Value   59/56/3875 0.7       Documented patient preferred pharmacies:    [facility] - [place], [place] - [address]  Phone: [phone] Fax: [phone]

## 2023-11-21 ENCOUNTER — Other Ambulatory Visit (HOSPITAL_BASED_OUTPATIENT_CLINIC_OR_DEPARTMENT_OTHER): Payer: Self-pay | Admitting: Family Medicine

## 2023-11-21 DIAGNOSIS — T7840XD Allergy, unspecified, subsequent encounter: Secondary | ICD-10-CM

## 2023-11-21 NOTE — Telephone Encounter (Signed)
 PER Pharmacy, Priscilla Williams is a 53 year old female has requested a refill of epipen .      Last OFFICE/TELE Visit:  09/08/23 with pcp      Last Physical Exam:   03/14/2022     There are no preventive care reminders to display for this patient.    Other Med Adult:  Most Recent BP Reading(s)  10/28/23 : 137/88        Cholesterol (mg/dL)   Date Value   84/16/6063 228     LOW DENSITY LIPOPROTEIN DIRECT (mg/dL)   Date Value   01/60/1093 130     HIGH DENSITY LIPOPROTEIN (mg/dL)   Date Value   23/55/7322 95     TRIGLYCERIDES (mg/dL)   Date Value   02/54/2706 68         THYROID  SCREEN TSH REFLEX FT4 (uIU/mL)   Date Value   06/24/2023 1.440         TSH (THYROID  STIM HORMONE) (uIU/mL)   Date Value   07/31/2015 1.290       HEMOGLOBIN A1C (%)   Date Value   06/24/2023 4.9       No results found for: "POCA1C"      No results found for: "INR"    SODIUM (mmol/L)   Date Value   06/24/2023 139       POTASSIUM (mmol/L)   Date Value   06/24/2023 4.7           CREATININE (mg/dL)   Date Value   23/76/2831 0.7       Documented patient preferred pharmacies:    [facility] - [place], [place] - [address]  Phone: [phone] Fax: [phone]

## 2023-11-24 MED ORDER — OTHER MEDICATION
1.0000 | Freq: Every day | 0 refills | Status: AC
Start: 2023-11-24 — End: 2024-11-23

## 2024-02-11 ENCOUNTER — Encounter (HOSPITAL_BASED_OUTPATIENT_CLINIC_OR_DEPARTMENT_OTHER): Payer: Self-pay | Admitting: Family Medicine

## 2024-02-11 DIAGNOSIS — Z8639 Personal history of other endocrine, nutritional and metabolic disease: Secondary | ICD-10-CM

## 2024-02-11 DIAGNOSIS — K580 Irritable bowel syndrome with diarrhea: Secondary | ICD-10-CM

## 2024-02-11 DIAGNOSIS — E663 Overweight: Secondary | ICD-10-CM

## 2024-02-11 NOTE — Telephone Encounter (Signed)
 PER who is calling: Patient (self), Priscilla Williams is a 53 year old female has requested a refill of         semaglutide -Weight Management (WEGOVY ) 2.4 MG/0.75ML sc auto-injector                 Last Office Visit  : 05/16/2023 Onesimo Sor, RN      Last Tele Visit:  09/08/2023      Last Physical Exam:  03/14/2022    There are no preventive care reminders to display for this patient.    Other Med Adult:  Most Recent BP Reading(s)  10/28/23 : 137/88        Cholesterol (mg/dL)   Date Value   87/68/7975 228     LOW DENSITY LIPOPROTEIN DIRECT (mg/dL)   Date Value   87/68/7975 130     HIGH DENSITY LIPOPROTEIN (mg/dL)   Date Value   87/68/7975 95     TRIGLYCERIDES (mg/dL)   Date Value   87/68/7975 68         THYROID  SCREEN TSH REFLEX FT4 (uIU/mL)   Date Value   06/24/2023 1.440         TSH (THYROID  STIM HORMONE) (uIU/mL)   Date Value   07/31/2015 1.290       HEMOGLOBIN A1C (%)   Date Value   06/24/2023 4.9       No results found for: POCA1C      No results found for: INR    SODIUM (mmol/L)   Date Value   06/24/2023 139       POTASSIUM (mmol/L)   Date Value   06/24/2023 4.7           CREATININE (mg/dL)   Date Value   87/68/7975 0.7       Documented patient preferred pharmacies:    [facility] - [place], [place] - [address]  Phone: [phone] Fax: [phone]

## 2024-02-12 ENCOUNTER — Other Ambulatory Visit (HOSPITAL_BASED_OUTPATIENT_CLINIC_OR_DEPARTMENT_OTHER): Payer: Self-pay | Admitting: Family Medicine

## 2024-02-12 ENCOUNTER — Telehealth (HOSPITAL_BASED_OUTPATIENT_CLINIC_OR_DEPARTMENT_OTHER): Payer: Self-pay

## 2024-02-12 DIAGNOSIS — K580 Irritable bowel syndrome with diarrhea: Secondary | ICD-10-CM

## 2024-02-12 MED ORDER — DOXEPIN HCL 10 MG PO CAPS
10.0000 mg | ORAL_CAPSULE | Freq: Every day | ORAL | 0 refills | Status: DC
Start: 2024-02-12 — End: 2024-05-14

## 2024-02-12 MED ORDER — SEMAGLUTIDE-WEIGHT MANAGEMENT 2.4 MG/0.75ML SC SOAJ: 2.4000 mg | SUBCUTANEOUS | 2 refills | Status: DC

## 2024-02-12 NOTE — Telephone Encounter (Signed)
 I called the patient and Left a voicemail to return call    If patient calls back, please: Schedule in-person visit for medication review and vitals, time frame: 3 months, with Reche Aid, MD   , if appointment not available: Schedule further out with provider indicated

## 2024-02-12 NOTE — Telephone Encounter (Signed)
 PER who is calling: Patient (self), Priscilla Williams is a 53 year old female has requested a refill of       DOXEPIN          Last Office Visit  : 05/16/2023 Onesimo Sor, RN      Last Tele Visit:  09/08/2023      Last Physical Exam:  03/14/2022    There are no preventive care reminders to display for this patient.    Other Med Adult:  Most Recent BP Reading(s)  10/28/23 : 137/88        Cholesterol (mg/dL)   Date Value   87/68/7975 228     LOW DENSITY LIPOPROTEIN DIRECT (mg/dL)   Date Value   87/68/7975 130     HIGH DENSITY LIPOPROTEIN (mg/dL)   Date Value   87/68/7975 95     TRIGLYCERIDES (mg/dL)   Date Value   87/68/7975 68         THYROID  SCREEN TSH REFLEX FT4 (uIU/mL)   Date Value   06/24/2023 1.440         TSH (THYROID  STIM HORMONE) (uIU/mL)   Date Value   07/31/2015 1.290       HEMOGLOBIN A1C (%)   Date Value   06/24/2023 4.9       No results found for: POCA1C      No results found for: INR    SODIUM (mmol/L)   Date Value   06/24/2023 139       POTASSIUM (mmol/L)   Date Value   06/24/2023 4.7           CREATININE (mg/dL)   Date Value   87/68/7975 0.7       Documented patient preferred pharmacies:    [facility] - [place], [place] - [address]  Phone: [phone] Fax: [phone]

## 2024-03-19 ENCOUNTER — Encounter (HOSPITAL_BASED_OUTPATIENT_CLINIC_OR_DEPARTMENT_OTHER): Payer: Self-pay | Admitting: Family Medicine

## 2024-03-21 ENCOUNTER — Other Ambulatory Visit (HOSPITAL_BASED_OUTPATIENT_CLINIC_OR_DEPARTMENT_OTHER): Payer: Self-pay | Admitting: Family Medicine

## 2024-03-21 DIAGNOSIS — E559 Vitamin D deficiency, unspecified: Secondary | ICD-10-CM

## 2024-03-21 NOTE — Telephone Encounter (Signed)
 PER who is calling: Pharmacy, Priscilla Williams is a 53 year old female has requested a refill of        VITAMIN D .                 Last Office Visit  : 05/16/2023 Onesimo Sor, RN       Last Tele Visit: [email or web address]       Last Physical Exam: [email or web address]@     There are no preventive care reminders to display for this patient.     Other Med Adult:  Most Recent BP Reading(s)  10/28/23 : 137/88        Cholesterol (mg/dL)   Date Value   87/68/7975 228     LOW DENSITY LIPOPROTEIN DIRECT (mg/dL)   Date Value   87/68/7975 130     HIGH DENSITY LIPOPROTEIN (mg/dL)   Date Value   87/68/7975 95     TRIGLYCERIDES (mg/dL)   Date Value   87/68/7975 68         THYROID  SCREEN TSH REFLEX FT4 (uIU/mL)   Date Value   06/24/2023 1.440         TSH (THYROID  STIM HORMONE) (uIU/mL)   Date Value   07/31/2015 1.290       HEMOGLOBIN A1C (%)   Date Value   06/24/2023 4.9       No results found for: POCA1C      No results found for: INR    SODIUM (mmol/L)   Date Value   06/24/2023 139       POTASSIUM (mmol/L)   Date Value   06/24/2023 4.7           CREATININE (mg/dL)   Date Value   87/68/7975 0.7        Documented patient preferred pharmacies:    [facility] - [place], [place] - [address]  Phone: [phone] Fax: [phone]    [id number] - [place], [place] - [address]  Phone: [phone] Fax: [phone]

## 2024-03-26 ENCOUNTER — Ambulatory Visit: Attending: Family Medicine

## 2024-03-26 ENCOUNTER — Other Ambulatory Visit: Payer: Self-pay

## 2024-03-26 ENCOUNTER — Ambulatory Visit (HOSPITAL_BASED_OUTPATIENT_CLINIC_OR_DEPARTMENT_OTHER): Payer: Self-pay

## 2024-03-26 ENCOUNTER — Encounter (HOSPITAL_BASED_OUTPATIENT_CLINIC_OR_DEPARTMENT_OTHER): Payer: Self-pay | Admitting: Family

## 2024-03-26 ENCOUNTER — Ambulatory Visit: Attending: Family | Admitting: Family

## 2024-03-26 DIAGNOSIS — L608 Other nail disorders: Secondary | ICD-10-CM | POA: Diagnosis not present

## 2024-03-26 MED ORDER — CIPROFLOXACIN HCL 0.3 % OP OINT
TOPICAL_OINTMENT | OPHTHALMIC | 0 refills | Status: AC
Start: 2024-03-26 — End: 2024-04-09

## 2024-03-26 NOTE — Progress Notes (Signed)
 Priscilla Williams is a 53 year old female patient of D'Agata, Caitlin, MD who is scheduled for a telemedicine visit with the nurse advice line for nail concern.    SUBJECTIVE:    Pt presenting with green discoloration to nails  Reports has gel nails and when removed had green line on nail and a few spots on other nails   Right pinky has the largest area  Nothing on skin  No pain or drainage  No associated systemic symptoms  Review of Systems/HPI:    As above    All other systems reviewed and negative.    Patient Active Problem List:     Migraine     Irritable bowel syndrome     BMI 37.0-37.9, adult     Nasal polyps     Osteoarthritis of cervical spine with myelopathy     Hepatic steatosis     Asymptomatic microscopic hematuria     Brain aneurysm     Essential hypertension     Maxillary sinus cyst     Lung nodule     RUQ abdominal pain     Family history of cardiac arrest     Family history of breast cancer     Family history of ovarian cancer     Cervical spondylosis with myelopathy     Occlusion of right carotid artery     HTN (hypertension), benign     Family history of premature CAD     Overweight (BMI 25.0-29.9)    D-1000 EXTRA STRENGTH 25 MCG (1000 UT) tablet, TAKE 1 TABLET BY MOUTH EVERY DAY IN THE MORNING, Disp: 90 tablet, Rfl: 3  semaglutide -Weight Management (WEGOVY ) 2.4 MG/0.75ML sc auto-injector, Inject 2.4 mg under the skin once a week, Disp: 3 mL, Rfl: 2  doxepin  (SINEQUAN ) 10 MG capsule, Take 1 capsule by mouth daily, Disp: 90 capsule, Rfl: 0  OTHER MEDICATION, Apply 1 kit Topically daily Compression Stockings.  Compression Strength: 30-40 mmHg  Compression Length Thigh, circumference of ankle, calf and thigh (include lower abdomen)  For use as directed.    DX: Pre-syncope  ICD10:R55, Disp: 1 kit, Rfl: 0  EPINEPHrine  (EPIPEN ) 0.3 MG/0.3ML injection, Inject 0.3 mg into the muscle as needed (Anaphylaxis. Seek emergency care if used), Disp: 1 each, Rfl: 1  rosuvastatin  (CRESTOR ) 10 MG tablet, Take 1 tablet  by mouth daily, Disp: 90 tablet, Rfl: 3  lisinopril  (ZESTRIL ) 20 MG tablet, Take 1 tablet by mouth daily, Disp: 90 tablet, Rfl: 3  gabapentin (NEURONTIN) 100 MG capsule, Take 1 capsule by mouth every 8 (eight) hours (Patient taking differently: Take 1 capsule by mouth every 8 (eight) hours as needed), Disp: , Rfl:     No current facility-administered medications on file prior to visit.      Review of Patient's Allergies indicates:   Wasp venom protein      Swelling    Comment:Other reaction(s): SWELLING,NAUSEA,SOB  Social History    Tobacco Use      Smoking status: Never      Smokeless tobacco: Never    Alcohol use: Yes      Comment: 1 drink every 6 months, wine    Drug use: No    Medical/Surgical/Family History reviewed.  Relevant changes have been made in 'history' section.    Recent laboratories and imaging reviewed prior to visit.      OBJECTIVE:    Vital Signs:  Vitals not collected for this telemedicine visit.    Physical Exam:  Const: Pt  alert and oriented.  Normal appearing, pleasant and in no acute distress.  General: speech clear at appropriate rate, speaking in full sentences  Psychiatric:  Mood and behavior normal   Photo shows areas of green/brown discoloration on both nails in photo    ASSESSMENT AND PLAN:  Additional plans reviewed with patient and listed below under patient instructions and provided in AVS.    1. Nail discoloration  Discoloration most consistent with pseudomonas rather than fungal  Will rx topical fluoroquinolone, discussed use  Keep hands/nails as dry as possible  If no resolution, follow up in person  If any symptoms worsen or any new symptoms develop please seek immediate medical attention in UC or ER  Pt verbalized understanding and had no further questions    - ciprofloxacin (CILOXAN) 0.3 % ophthalmic ointment; Apply topically 3 times daily  Dispense: 1 each; Refill: 0            Reasons to call or return to clinic were discussed.    I explained the diagnosis and treatment plan,  and the patient/parent/guardian expressed understanding of the content. We discussed all medicines prescribed and the importance of medication adherence. The patient/parent/guardian expressed understanding and no barriers to adherence were identified.  Possible side effects of the prescribed medication(s) were explained.  I attempted to answer all questions regarding the diagnosis and the proposed treatment.        Leodis Lavanda LABOR, APRN

## 2024-03-26 NOTE — Telephone Encounter (Signed)
 Adult General Triage    I spoke with Patient:     Chief Complaint: Patient had gel nails which she removed 2-3 weeks ago, she has green line on a nail and dots on a few of her nails; spots have not resolved.       Patient Active Problem List:     Migraine     Irritable bowel syndrome     BMI 37.0-37.9, adult     Nasal polyps     Osteoarthritis of cervical spine with myelopathy     Hepatic steatosis     Asymptomatic microscopic hematuria     Brain aneurysm     Essential hypertension     Maxillary sinus cyst     Lung nodule     RUQ abdominal pain     Family history of cardiac arrest     Family history of breast cancer     Family history of ovarian cancer     Cervical spondylosis with myelopathy     Occlusion of right carotid artery     HTN (hypertension), benign     Family history of premature CAD     Overweight (BMI 25.0-29.9)      Review of Patient's Allergies indicates:   Wasp venom protein      Swelling    Comment:Other reaction(s): SWELLING,NAUSEA,SOB    Emergency care:        The patient has the following symptoms:     Denies nail pain/ fever/chills- no swelling of fingers/ no redness, no pus.     Pregnancy:       Home Remedies:       Advised per nursing triage protocol.         Recommended disposition for patient:  Disposition: Scheduled Televisit Appointment     If patient referred to UC/ED advised that they may require further follow up and testing after the visit with their primary care office.     Instructed patient to call back for any new, worsening, or worrisome symptoms or concerns any time day or night.    Advised per nursing triage protocol.     Telephone Call Outcome:  Single Call Resolution        Reason for Disposition   Thick, yellow, or dark-yellow appearing fingernail    Protocols used: Fingernail Infection-A-AH

## 2024-04-01 ENCOUNTER — Telehealth (HOSPITAL_BASED_OUTPATIENT_CLINIC_OR_DEPARTMENT_OTHER): Payer: Self-pay | Admitting: Family Medicine

## 2024-04-01 ENCOUNTER — Encounter (HOSPITAL_BASED_OUTPATIENT_CLINIC_OR_DEPARTMENT_OTHER): Payer: Self-pay | Admitting: Family Medicine

## 2024-04-01 MED ORDER — PSYLLIUM 0.52 G PO CAPS
2.0000 | ORAL_CAPSULE | Freq: Every day | ORAL | 0 refills | Status: AC | PRN
Start: 2024-04-01 — End: 2024-06-30

## 2024-04-01 NOTE — Telephone Encounter (Signed)
 I called the patient and Left a voicemail to return call    If patient calls back, please: Book the appointment per the following guidance: in-person appointment for medication changes with PCP/team

## 2024-04-06 ENCOUNTER — Telehealth (HOSPITAL_BASED_OUTPATIENT_CLINIC_OR_DEPARTMENT_OTHER): Payer: Self-pay | Admitting: Family Medicine

## 2024-04-06 NOTE — Telephone Encounter (Signed)
 PA for wegovy  faxed to [facility]

## 2024-04-14 ENCOUNTER — Encounter (HOSPITAL_BASED_OUTPATIENT_CLINIC_OR_DEPARTMENT_OTHER): Payer: Self-pay | Admitting: Family Medicine

## 2024-05-14 ENCOUNTER — Other Ambulatory Visit (HOSPITAL_BASED_OUTPATIENT_CLINIC_OR_DEPARTMENT_OTHER): Payer: Self-pay | Admitting: Family Medicine

## 2024-05-14 DIAGNOSIS — K580 Irritable bowel syndrome with diarrhea: Secondary | ICD-10-CM

## 2024-05-14 MED ORDER — DOXEPIN HCL 10 MG PO CAPS
10.0000 mg | ORAL_CAPSULE | Freq: Every day | ORAL | 0 refills | Status: DC
Start: 2024-05-14 — End: 2024-06-13

## 2024-05-14 NOTE — Telephone Encounter (Signed)
 PER who is calling: Pharmacy, Priscilla Williams is a 53 year old female has requested a refill of       doxepin .                Last Office Visit  : 05/14/2024 D'Agata, Caitlin, MD      Last Tele Visit:  03/26/2024      Last Physical Exam:  03/14/2022    There are no preventive care reminders to display for this patient.    Other Med Adult:  Most Recent BP Reading(s)  10/28/23 : 137/88        Cholesterol (mg/dL)   Date Value   87/68/7975 228     LOW DENSITY LIPOPROTEIN DIRECT (mg/dL)   Date Value   87/68/7975 130     HIGH DENSITY LIPOPROTEIN (mg/dL)   Date Value   87/68/7975 95     TRIGLYCERIDES (mg/dL)   Date Value   87/68/7975 68         THYROID  SCREEN TSH REFLEX FT4 (uIU/mL)   Date Value   06/24/2023 1.440         TSH (THYROID  STIM HORMONE) (uIU/mL)   Date Value   07/31/2015 1.290       HEMOGLOBIN A1C (%)   Date Value   06/24/2023 4.9       No results found for: POCA1C      No results found for: INR    SODIUM (mmol/L)   Date Value   06/24/2023 139       POTASSIUM (mmol/L)   Date Value   06/24/2023 4.7           CREATININE (mg/dL)   Date Value   87/68/7975 0.7       Documented patient preferred pharmacies:    [facility] - [place], [place] - [address]  Phone: [phone] Fax: [phone]

## 2024-05-14 NOTE — Telephone Encounter (Signed)
 PER who is calling: Pharmacy, Priscilla Williams is a 53 year old female has requested a refill of       doxepin  (SINEQUAN ) 10 MG capsule .    ** Pharmacy requesting 90 days supply.      Last Office Visit  : 04/06/2024 D'Agata, Caitlin, MD      Last Tele Visit:  03/26/2024      Last Physical Exam:  03/14/2022    There are no preventive care reminders to display for this patient.    Other Med Adult:  Most Recent BP Reading(s)  10/28/23 : 137/88        Cholesterol (mg/dL)   Date Value   87/68/7975 228     LOW DENSITY LIPOPROTEIN DIRECT (mg/dL)   Date Value   87/68/7975 130     HIGH DENSITY LIPOPROTEIN (mg/dL)   Date Value   87/68/7975 95     TRIGLYCERIDES (mg/dL)   Date Value   87/68/7975 68         THYROID  SCREEN TSH REFLEX FT4 (uIU/mL)   Date Value   06/24/2023 1.440         TSH (THYROID  STIM HORMONE) (uIU/mL)   Date Value   07/31/2015 1.290       HEMOGLOBIN A1C (%)   Date Value   06/24/2023 4.9       No results found for: POCA1C      No results found for: INR    SODIUM (mmol/L)   Date Value   06/24/2023 139       POTASSIUM (mmol/L)   Date Value   06/24/2023 4.7           CREATININE (mg/dL)   Date Value   87/68/7975 0.7       Documented patient preferred pharmacies:    [facility] - [place], [place] - [address]  Phone: [phone] Fax: [phone]

## 2024-05-17 NOTE — Progress Notes (Signed)
 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 Merle Dennis who presents for management of Palpitations/presyncope. Alm husband with her today.     Feeling okay. She still has lightheaded with movement at time. The same. She is currenty on wegoby. She has not yet tried compression stockings due to not receiving them from the pharmacy.    Previous diagnostic workup included a heart monitor and an ultrasound of the heart, both of which were unremarkable. Her cholesterol levels have not been checked since the last visit, and there have been no significant changes in her diet, except for a reduction in coffee intake.      SOCIAL HISTORY:  Tobacco:None  Alcohol:Rarely on celebrations.  Drugs: None  Living Situation: Lives with husband and kids (4 kids)  Career: Working as Environmental Health Practitioner.    FAMILY HISTORY of CARDIOVASCULAR DISEASE:  Mother: HTN, Esophageal cancer, lung cancer.   Grandmother paternal: 56 from MI.  Father: MI at age 60 and another MI later 52 years old and stents,Carotid artery disease, 2 CVAs.  Siblings: Half brother passed at 23 years old from massive heart attack.  Children: Healthy kids.    Active Problem List:  Patient Active Problem List:     Migraine     Irritable bowel syndrome     BMI 37.0-37.9, adult     Nasal polyps     Osteoarthritis of cervical spine with myelopathy     Hepatic steatosis     Asymptomatic microscopic hematuria     Brain aneurysm     Essential hypertension     Maxillary sinus cyst     Lung nodule     RUQ abdominal pain     Family history of cardiac arrest     Family history of breast cancer     Family history of ovarian cancer     Cervical spondylosis  with myelopathy     Occlusion of right carotid artery     HTN (hypertension), benign     Family history of premature CAD     Class 1 obesity due to excess calories without serious comorbidity with body mass index (BMI) of 31.0 to 31.9 in adult      MEDICATIONS:  doxepin  (SINEQUAN ) 10 MG capsule, Take 1 capsule by mouth daily, Disp: 90 capsule, Rfl: 0  semaglutide -Weight Management (WEGOVY ) 2.4 MG/0.75ML sc auto-injector, Inject 2.4 mg under the skin once a week, Disp: 3 mL, Rfl: 2  rosuvastatin  (CRESTOR ) 10 MG tablet, Take 1 tablet by mouth daily, Disp: 90 tablet, Rfl: 3  lisinopril  (ZESTRIL ) 20 MG tablet, Take 1 tablet by mouth daily, Disp: 90 tablet, Rfl: 3  psyllium 0.52 GM capsule, Take 2 capsules by mouth daily as needed (Constipation) Must drink 8 ounces of fluid, Disp: 180 capsule, Rfl: 0  D-1000 EXTRA STRENGTH 25 MCG (1000 UT) tablet, TAKE 1 TABLET BY MOUTH EVERY DAY IN THE MORNING, Disp: 90 tablet, Rfl: 3  OTHER MEDICATION, Apply 1 kit Topically daily Compression Stockings.  Compression Strength: 30-40 mmHg  Compression Length Thigh, circumference of ankle, calf and thigh (include lower abdomen)  For use as directed.    DX: Pre-syncope  ICD10:R55, Disp: 1 kit, Rfl: 0  EPINEPHrine  (EPIPEN ) 0.3 MG/0.3ML injection, Inject 0.3 mg into the muscle as needed (Anaphylaxis. Seek emergency care if used), Disp: 1 each, Rfl: 1  gabapentin (NEURONTIN) 100 MG capsule, Take 1 capsule by mouth every 8 (eight) hours (Patient not taking: Reported on 05/18/2024), Disp: , Rfl:     No current facility-administered medications on file prior to visit.      Orders Placed This Encounter      OTHER MEDICATION      ALLERGIES:  Review of Patient's Allergies indicates:   Wasp venom protein      Swelling    Comment:Other reaction(s): SWELLING,NAUSEA,SOB      PHYSICAL EXAM:   05/18/24  1029 05/18/24  1032   BP: 142/88 125/87   Site: Right Arm    Position: Sitting    Cuff Size: Regular    Pulse: 74    Temp: 96.2 F (35.7 C)    TempSrc:  Temporal    SpO2: 99%    Weight: 73.9 kg (163 lb)    Height: 5' 0.35 (1.533 m)          Most Recent BP Reading(s)  05/18/24 : 125/87  10/28/23 : 137/88  06/24/23 : 131/88  05/16/23 : 123/83  08/21/22 : 123/84      Most Recent Pulse Reading(s)  05/18/24 : 74  10/28/23 : 78  06/24/23 : 74  05/16/23 : 69  08/21/22 : 77      Most Recent Weight Reading(s)  05/18/24 : 73.9 kg (163 lb)  03/26/24 : 73.5 kg (162 lb)  10/28/23 : 70.3 kg (155 lb)  06/24/23 : 69.4 kg (153 lb)  05/16/23 : 69.2 kg (152 lb 9.6 oz)       General/Constitutional: Vital signs reviewed and as per above.  Patient appears stated age.  Not in acute distress.  Cardiovascular: Normal Rate, Regular Rhythm.  No murmurs, rubs, gallops.  Normal S1, S2.  No S3, S4.  PMI nondisplaced.  No varicose veins.  No leg edema/erythema.  2+ radial and pt pulses bilaterally.  No carotid bruits/thrills.  Respiratory: Clear to auscultation anteriorly/posteriorly without wheezes, crackles, rhonchi, stridor.  Good air movement.  Skin: Warm, dry.    STUDIES:  LABS:   Glucose Random (mg/dL)   Date Value   88/74/7974 85     BUN (UREA NITROGEN) (mg/dL)   Date Value   88/74/7974 10     CALCIUM  (mg/dL)   Date Value   88/74/7974 9.3     CHLORIDE (mmol/L)   Date Value   05/18/2024 102     CARBON DIOXIDE (mmol/L)   Date Value   05/18/2024 28     ANION GAP (mmol/L)   Date Value   05/18/2024 9 (L)     CREATININE (mg/dL)   Date Value   88/74/7974 0.7     POTASSIUM (mmol/L)   Date Value   05/18/2024 5.4 (H)     SODIUM (mmol/L)   Date Value   05/18/2024 139     ESTIMATED GLOMERULAR FILT RATE (ML/MIN)   Date Value   05/18/2024 > 60               Estimated Creatinine Clearance: 85 mL/min (based on SCr of 0.7 mg/dL).    Recent Lab Values    No lab values to display.       Cholesterol (mg/dL)   Date Value   88/74/7974 152   06/24/2023 228   02/19/2021 235  LOW DENSITY LIPOPROTEIN DIRECT (mg/dL)   Date Value   88/74/7974 50   06/24/2023 130   02/19/2021 138     HIGH DENSITY LIPOPROTEIN  (mg/dL)   Date Value   88/74/7974 95   06/24/2023 95   02/19/2021 89 (H)     TRIGLYCERIDES (mg/dL)   Date Value   88/74/7974 70   06/24/2023 68   02/19/2021 94       ALBUMIN (g/dL)   Date Value   88/74/7974 4.4     ALKALINE PHOSPHATASE (U/L)   Date Value   05/18/2024 105     ALANINE AMINOTRANSFERASE (U/L)   Date Value   05/18/2024 39     ASPARTATE AMINOTRANSFERASE (U/L)   Date Value   05/18/2024 33     BILIRUBIN DIRECT (mg/dl)   Date Value   89/78/7994 0.1     BILIRUBIN TOTAL (mg/dL)   Date Value   88/74/7974 0.5     TOTAL PROTEIN (g/dL)   Date Value   88/74/7974 6.5        HEMOGLOBIN A1C (%)   Date Value   06/24/2023 4.9   03/26/2019 5.0   07/23/2016 5.2       No results found for: POCA1C    TSH (THYROID  STIM HORMONE) (uIU/mL)   Date Value   07/31/2015 1.290         EKG:   On my personal review, today's EKG:   Prior EKGs (on my review):  06/24/2023:  NSR    ECHO:   TTE 09/22/2023  Conclusions: (click link below for full report)  1. Left Ventricle: Global systolic function: EF is estimated visually at 60% .  2. Left Ventricle: Regional systolic function: Wall motion: There are no regional wall motion abnormalities.  3. Right Ventricle: Normal size and systolic function.  4. Tricuspid Valve: The RVSP is estimated at 23 mmHg which is normal.  5. No hemodynamically significant valvular abnormalities.  6. There is no prior study available in our system for comparison.    STRESS TESTING:   NA    CARDIAC CATHETERIZATION:  NA    CARDIAC-RELEVANT CT/MRI/US :  NA    HOLTER/EVENT MONITORING:  Event monitor 07/08/2023  Conclusion:  The patient was monitored for 13 days and 16 hours.  Average HR 84 bpm (Range 58 - 144 bpm).  Predominant underlying rhythm was Sinus Rhythm.  No Atrial Fibrillation.  No high-grade AV blocks.  PVC burden is <1.0%.  No nonsustained ventricular tachycardia or sustained ventricular tachycardia.  PAC burden was <1.0%.  No SVT.    There were 9 patient-triggered and diary events, almost all of which  corresponded to Sinus Rhythm without ectopy.      ASSESSMENT/PLAN:    Mild Carotid disease  Family hx of premature CAD  The 10-year ASCVD risk score (Arnett DK, et al., 2019) is: 0.7%, LP (a) of 33.7, Apo B 85mg /dl.  ASCVD Risk Enhancers: Family history of premature ASCVD  - On rosuvastatin  10 mg daily.  She is tolerating it well without any symptoms.   - Lipid panel ordered.Target LDL of less than 100.    Palpitations  Presyncope  TSH was within normal limits an event monitor and echo unremarkable.  Her symptoms are more likely related to anxiety and stress.  - Discussed cutting down on caffeine  to 1 small cup a day.  - Discussed fluid intake at least 3 L a day.  - Prescribed compression stockings to try while she is at work to see if  they help with the dizziness.  - Further instructions given.    Obesity  She reports her weight was 227lbs in 2022 and lost weight now 163lbs and on wegovy   - She is doing very well and continues to try to loose weight.  - She will try to increase physical activity.    HTN  BP 125/87.  - Monitor at home.  - Target <130/80.  - Continue Lisinopril  20mg  daily.     FOLLOW-UP:  Return for follow up with me in the Cardiology Clinic in 6 month(s).  The patient may schedule an earlier appointment to discuss heart-related issues, as needed.    _____________________________________________________________________________________________________________________________________________  I spent a total of 30 minutes on this visit on the date of service (total time includes all activities performed on the date of service such as chart review, time speaking with the patient, documenting and communicating with other providers)   Thank you for the privilege of involving me in this patient's care.  Please feel free to contact me if you have any questions.  This patient encounter note was created using voice-recognition software and in real time. Please excuse any typographical errors that have not  been edited out.     Electronically signed by:   Dell Batters, MD  [facility]  05/18/2024 5:06 PM

## 2024-05-18 ENCOUNTER — Ambulatory Visit (HOSPITAL_BASED_OUTPATIENT_CLINIC_OR_DEPARTMENT_OTHER): Admitting: Cardiology

## 2024-05-18 ENCOUNTER — Other Ambulatory Visit: Payer: Self-pay

## 2024-05-18 ENCOUNTER — Ambulatory Visit
Admission: RE | Admit: 2024-05-18 | Discharge: 2024-05-18 | Disposition: A | Discharge status: Home / Self Care | Attending: Cardiology | Admitting: Cardiology

## 2024-05-18 VITALS — BP 125/87 | HR 74 | Temp 96.2°F | Ht 60.35 in | Wt 163.0 lb

## 2024-05-18 DIAGNOSIS — E66811 Obesity, class 1: Secondary | ICD-10-CM

## 2024-05-18 DIAGNOSIS — I10 Essential (primary) hypertension: Secondary | ICD-10-CM | POA: Insufficient documentation

## 2024-05-18 DIAGNOSIS — Z8249 Family history of ischemic heart disease and other diseases of the circulatory system: Secondary | ICD-10-CM | POA: Insufficient documentation

## 2024-05-18 DIAGNOSIS — Z6831 Body mass index (BMI) 31.0-31.9, adult: Secondary | ICD-10-CM | POA: Insufficient documentation

## 2024-05-18 DIAGNOSIS — R55 Syncope and collapse: Secondary | ICD-10-CM

## 2024-05-18 DIAGNOSIS — E782 Mixed hyperlipidemia: Secondary | ICD-10-CM

## 2024-05-18 DIAGNOSIS — E6609 Other obesity due to excess calories: Secondary | ICD-10-CM

## 2024-05-18 DIAGNOSIS — I6522 Occlusion and stenosis of left carotid artery: Secondary | ICD-10-CM | POA: Insufficient documentation

## 2024-05-18 LAB — COMPREHENSIVE METABOLIC PANEL
ALANINE AMINOTRANSFERASE: 39 U/L (ref 12–45)
ALBUMIN: 4.4 g/dL (ref 3.4–5.2)
ALKALINE PHOSPHATASE: 105 U/L (ref 45–117)
ANION GAP: 9 mmol/L — ABNORMAL LOW (ref 10–22)
ASPARTATE AMINOTRANSFERASE: 33 U/L (ref 8–34)
BILIRUBIN TOTAL: 0.5 mg/dL (ref 0.2–1.0)
BUN (UREA NITROGEN): 10 mg/dL (ref 7–18)
CALCIUM: 9.3 mg/dL (ref 8.5–10.5)
CARBON DIOXIDE: 28 mmol/L (ref 21–32)
CHLORIDE: 102 mmol/L (ref 98–107)
CREATININE: 0.7 mg/dL (ref 0.4–1.2)
ESTIMATED GLOMERULAR FILT RATE: >60 mL/min (ref >60)
Glucose Random: 85 mg/dL (ref 74–160)
POTASSIUM: 5.4 mmol/L — ABNORMAL HIGH (ref 3.5–5.1)
SODIUM: 139 mmol/L (ref 136–145)
TOTAL PROTEIN: 6.5 g/dL (ref 6.4–8.2)

## 2024-05-18 LAB — LIPID PANEL
Cholesterol: 152 mg/dL (ref 0–239)
HIGH DENSITY LIPOPROTEIN: 95 mg/dL (ref 40–60)
LOW DENSITY LIPOPROTEIN DIRECT: 50 mg/dL (ref 0–189)
TRIGLYCERIDES: 70 mg/dL (ref 0–150)

## 2024-05-18 MED ORDER — OTHER MEDICATION
2.0000 | Freq: Every day | 0 refills | Status: AC
Start: 2024-05-18 — End: 2025-05-18

## 2024-05-18 NOTE — Patient Instructions (Addendum)
-   Blood tests today.  - Try Nick's bars or ice-cream if craving for sweets since it has low sugar content.  - Avoid fried food and oily food. If using oil for cooking use Avocado or Canola oil.  - Recommend (based on AHA recommendations) at least 150 minutes of moderate-intensity aerobic activity or 75 minutes of vigorous aerobic activity per week.  Include moderate- or high-intensity muscle building activity at least 2 days per week.  Ideally, perform 5 hours of activity per week.  Start slow and increase gradually.          Usually script is sent to medical supply store and they can go get a fitting done. Script was sent to [facility]:      Fitting Hours   By Appointment Only  Call: [phone]

## 2024-05-21 ENCOUNTER — Ambulatory Visit (HOSPITAL_BASED_OUTPATIENT_CLINIC_OR_DEPARTMENT_OTHER): Payer: Self-pay | Admitting: Cardiology

## 2024-06-03 ENCOUNTER — Encounter (HOSPITAL_BASED_OUTPATIENT_CLINIC_OR_DEPARTMENT_OTHER): Payer: Self-pay | Admitting: Family Medicine

## 2024-06-03 DIAGNOSIS — Z8639 Personal history of other endocrine, nutritional and metabolic disease: Secondary | ICD-10-CM

## 2024-06-03 DIAGNOSIS — E663 Overweight: Secondary | ICD-10-CM

## 2024-06-04 ENCOUNTER — Other Ambulatory Visit (HOSPITAL_BASED_OUTPATIENT_CLINIC_OR_DEPARTMENT_OTHER): Payer: Self-pay | Admitting: Family Medicine

## 2024-06-04 DIAGNOSIS — Z8639 Personal history of other endocrine, nutritional and metabolic disease: Secondary | ICD-10-CM

## 2024-06-04 DIAGNOSIS — E663 Overweight: Secondary | ICD-10-CM

## 2024-06-04 MED ORDER — SEMAGLUTIDE-WEIGHT MANAGEMENT 2.4 MG/0.75ML SC SOAJ: 2.4000 mg | SUBCUTANEOUS | 5 refills | Status: AC

## 2024-06-04 NOTE — Telephone Encounter (Signed)
 PER who is calling: Patient (self), Priscilla Williams is a 53 year old female has requested a refill of         Wegovy .      Last Office Visit  : 05/16/2023 Onesimo Sor, RN      Last Tele Visit:  03/26/2024      Last Physical Exam:  03/14/2022    There are no preventive care reminders to display for this patient.      Other Med Adult:  Most Recent BP Reading(s)  05/18/24 : 125/87        Cholesterol (mg/dL)   Date Value   88/74/7974 152     LOW DENSITY LIPOPROTEIN DIRECT (mg/dL)   Date Value   88/74/7974 50     HIGH DENSITY LIPOPROTEIN (mg/dL)   Date Value   88/74/7974 95     TRIGLYCERIDES (mg/dL)   Date Value   88/74/7974 70         THYROID  SCREEN TSH REFLEX FT4 (uIU/mL)   Date Value   06/24/2023 1.440         TSH (THYROID  STIM HORMONE) (uIU/mL)   Date Value   07/31/2015 1.290       HEMOGLOBIN A1C (%)   Date Value   06/24/2023 4.9       No results found for: POCA1C      No results found for: INR    SODIUM (mmol/L)   Date Value   05/18/2024 139       POTASSIUM (mmol/L)   Date Value   05/18/2024 5.4 (H)           CREATININE (mg/dL)   Date Value   88/74/7974 0.7         Documented patient preferred pharmacies:    [facility] - [place], [place] - [address]  Phone: [phone] Fax: [phone]

## 2024-06-25 ENCOUNTER — Telehealth (HOSPITAL_BASED_OUTPATIENT_CLINIC_OR_DEPARTMENT_OTHER): Payer: Self-pay | Admitting: Family Medicine

## 2024-06-25 DIAGNOSIS — Z1283 Encounter for screening for malignant neoplasm of skin: Secondary | ICD-10-CM

## 2024-06-25 NOTE — Telephone Encounter (Signed)
 REFERRAL REQUEST- PROVIDER, PLEASE REVIEW AND SIGN ORDER IF APPROPRIATE      HOW IS REFERRAL BEING REQUESTED: fax    WHO IS REFERRAL BEING REQUESTED BY: Other: [facility]    REFERRED TO SPECIALTY: Dermatology     DIAGNOSIS/CHIEF COMPLAINT: Z12.83 FULL skin check    HAVE YOU SEEN YOUR PCP FOR THIS ISSUE:     HAVE YOU SEEN YOUR PCP WITHIN THE LAST YEAR: Yes      [facility] ONLY  PROVIDER: Elspeth Banister   DOS:07/01/2024  WEP:[id number]  NUMBER OF VISITS:6  LOCATION:[facility] [place]   PHONE:[phone]  FAX:[phone]

## 2024-07-15 ENCOUNTER — Encounter (HOSPITAL_BASED_OUTPATIENT_CLINIC_OR_DEPARTMENT_OTHER): Payer: Self-pay | Admitting: Family Medicine

## 2024-07-15 DIAGNOSIS — I10 Essential (primary) hypertension: Secondary | ICD-10-CM

## 2024-07-16 ENCOUNTER — Other Ambulatory Visit (HOSPITAL_BASED_OUTPATIENT_CLINIC_OR_DEPARTMENT_OTHER): Payer: Self-pay | Admitting: Family Medicine

## 2024-07-16 DIAGNOSIS — I10 Essential (primary) hypertension: Secondary | ICD-10-CM

## 2024-07-16 MED ORDER — LISINOPRIL 20 MG PO TABS: 20.0000 mg | ORAL_TABLET | Freq: Every day | ORAL | 0 refills | Status: AC

## 2024-07-16 NOTE — Telephone Encounter (Signed)
 PER who is calling: Patient (self), Priscilla Williams is a 54 year old female has requested a refill of Lisinopril .    This refill request failed protocol because   Out of range lab, unable to approve protocol automatically. Provider please review if appropriate. Thank you!         Normal serum potassium in past 12 months       Last Office Visit: 09/08/2023 with PCP  Last Physical Exam: 03/14/2022    There are no preventive care reminders to display for this patient.    HTN Med:    Most Recent BP Reading(s)  05/18/24 : 125/87  10/28/23 : 137/88  06/24/23 : 131/88      Documented patient preferred pharmacies:    [facility] - [place], [place] - [address]  Phone: [phone] Fax: [phone]

## 2024-07-16 NOTE — Telephone Encounter (Signed)
 PER who is calling: Patient (self), Priscilla Williams is a 54 year old female has requested a refill of       Zestril  .              Last Office Visit  : 05/16/2023 Onesimo Sor, RN      Last Tele Visit:  03/26/2024 Leodis Lavanda DELENA, APRN      Last Physical Exam:  03/14/2022    There are no preventive care reminders to display for this patient.    Other Med Adult:  Most Recent BP Reading(s)  05/18/24 : 125/87        Cholesterol (mg/dL)   Date Value   88/74/7974 152     LOW DENSITY LIPOPROTEIN DIRECT (mg/dL)   Date Value   88/74/7974 50     HIGH DENSITY LIPOPROTEIN (mg/dL)   Date Value   88/74/7974 95     TRIGLYCERIDES (mg/dL)   Date Value   88/74/7974 70         THYROID  SCREEN TSH REFLEX FT4 (uIU/mL)   Date Value   06/24/2023 1.440         TSH (THYROID  STIM HORMONE) (uIU/mL)   Date Value   07/31/2015 1.290       HEMOGLOBIN A1C (%)   Date Value   06/24/2023 4.9       No results found for: POCA1C      No results found for: INR    SODIUM (mmol/L)   Date Value   05/18/2024 139       POTASSIUM (mmol/L)   Date Value   05/18/2024 5.4 (H)           CREATININE (mg/dL)   Date Value   88/74/7974 0.7       Documented patient preferred pharmacies:    [facility] - [place], [place] - [address]  Phone: [phone] Fax: [phone]

## 2024-07-23 ENCOUNTER — Encounter (HOSPITAL_BASED_OUTPATIENT_CLINIC_OR_DEPARTMENT_OTHER): Payer: Self-pay | Admitting: Family Medicine

## 2024-07-23 ENCOUNTER — Telehealth (HOSPITAL_BASED_OUTPATIENT_CLINIC_OR_DEPARTMENT_OTHER): Payer: Self-pay

## 2024-07-23 DIAGNOSIS — E875 Hyperkalemia: Secondary | ICD-10-CM

## 2024-07-23 NOTE — Telephone Encounter (Signed)
 Lab ordered.

## 2024-07-23 NOTE — Telephone Encounter (Signed)
 Pt called about getting potassium re-drawn prior to appt with pcp    She said she got a message to have lab done prior to appt.   RN advised her that lab is not ordered and will send a message to pcp to have lab ordered and she would like my chart message sent so she can call the lab and have it possibly drawn this weekend     Message sent to provider

## 2024-07-26 ENCOUNTER — Encounter (HOSPITAL_BASED_OUTPATIENT_CLINIC_OR_DEPARTMENT_OTHER): Payer: Self-pay | Admitting: Cardiology

## 2024-07-26 DIAGNOSIS — I6522 Occlusion and stenosis of left carotid artery: Secondary | ICD-10-CM

## 2024-07-26 DIAGNOSIS — Z8249 Family history of ischemic heart disease and other diseases of the circulatory system: Secondary | ICD-10-CM

## 2024-07-27 ENCOUNTER — Other Ambulatory Visit (HOSPITAL_BASED_OUTPATIENT_CLINIC_OR_DEPARTMENT_OTHER): Payer: Self-pay | Admitting: Cardiology

## 2024-07-27 ENCOUNTER — Encounter (HOSPITAL_BASED_OUTPATIENT_CLINIC_OR_DEPARTMENT_OTHER): Payer: Self-pay | Admitting: Family Medicine

## 2024-07-27 DIAGNOSIS — I6522 Occlusion and stenosis of left carotid artery: Secondary | ICD-10-CM

## 2024-07-27 DIAGNOSIS — Z8249 Family history of ischemic heart disease and other diseases of the circulatory system: Secondary | ICD-10-CM

## 2024-07-27 MED ORDER — ROSUVASTATIN CALCIUM 10 MG PO TABS: 10.0000 mg | ORAL_TABLET | Freq: Every day | ORAL | 3 refills | Status: AC

## 2024-07-27 NOTE — Telephone Encounter (Signed)
 PER who is calling: Patient (self), Priscilla Williams is a 54 year old female has requested a refill of       rosuvastatin .        Only complete for controlled medication:    Previously prescribed:  Start Date          End Date              Last Office Visit  : 05/18/2024 Arletha Deist, MD      Last Tele Visit:  Visit date not found      Last Physical Exam:  03/14/2022    There are no preventive care reminders to display for this patient.    Other Med Adult:  Most Recent BP Reading(s)  05/18/24 : 125/87        Cholesterol (mg/dL)   Date Value   88/74/7974 152     LOW DENSITY LIPOPROTEIN DIRECT (mg/dL)   Date Value   88/74/7974 50     HIGH DENSITY LIPOPROTEIN (mg/dL)   Date Value   88/74/7974 95     TRIGLYCERIDES (mg/dL)   Date Value   88/74/7974 70         THYROID  SCREEN TSH REFLEX FT4 (uIU/mL)   Date Value   06/24/2023 1.440         TSH (THYROID  STIM HORMONE) (uIU/mL)   Date Value   07/31/2015 1.290       HEMOGLOBIN A1C (%)   Date Value   06/24/2023 4.9       No results found for: POCA1C      No results found for: INR    SODIUM (mmol/L)   Date Value   05/18/2024 139       POTASSIUM (mmol/L)   Date Value   05/18/2024 5.4 (H)           CREATININE (mg/dL)   Date Value   88/74/7974 0.7       Documented patient preferred pharmacies:    [facility] - [place], [place] - [address]  Phone: [phone] Fax: [phone]

## 2024-07-28 ENCOUNTER — Telehealth (HOSPITAL_BASED_OUTPATIENT_CLINIC_OR_DEPARTMENT_OTHER): Payer: Self-pay | Admitting: Family Medicine

## 2024-07-28 NOTE — Telephone Encounter (Signed)
 Hello,    Please schedule a weight/height check for the patient as needed for weight loss medication PA.  Please circle back to [facility] once aquired.    Thank you!

## 2024-07-31 ENCOUNTER — Ambulatory Visit

## 2024-08-20 ENCOUNTER — Ambulatory Visit: Admitting: Family Medicine

## 2024-12-21 ENCOUNTER — Ambulatory Visit: Admitting: Cardiology
